# Patient Record
Sex: Female | Born: 1960
Health system: Southern US, Community
[De-identification: ages and names within clinical notes are randomized; demographics above are authoritative.]

## PROBLEM LIST (undated history)

## (undated) ENCOUNTER — Ambulatory Visit (HOSPITAL_COMMUNITY): Admission: EM | Discharge status: Against Medical Advice | Payer: 59

## (undated) DIAGNOSIS — F3289 Other specified depressive episodes: Secondary | ICD-10-CM

## (undated) DIAGNOSIS — R000 Tachycardia, unspecified: Secondary | ICD-10-CM

## (undated) DIAGNOSIS — I10 Essential (primary) hypertension: Secondary | ICD-10-CM

## (undated) DIAGNOSIS — F411 Generalized anxiety disorder: Secondary | ICD-10-CM

## (undated) DIAGNOSIS — E559 Vitamin D deficiency, unspecified: Secondary | ICD-10-CM

## (undated) DIAGNOSIS — F329 Major depressive disorder, single episode, unspecified: Secondary | ICD-10-CM

## (undated) DIAGNOSIS — E669 Obesity, unspecified: Secondary | ICD-10-CM

## (undated) DIAGNOSIS — E282 Polycystic ovarian syndrome: Secondary | ICD-10-CM

## (undated) DIAGNOSIS — S83419A Sprain of medial collateral ligament of unspecified knee, initial encounter: Secondary | ICD-10-CM

## (undated) HISTORY — DX: Essential (primary) hypertension: I10

## (undated) HISTORY — DX: Major depressive disorder, single episode, unspecified: F32.9

## (undated) HISTORY — DX: Other specified depressive episodes: F32.89

## (undated) HISTORY — DX: Tachycardia, unspecified: R00.0

## (undated) HISTORY — DX: Generalized anxiety disorder: F41.1

---
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
## 1998-02-12 ENCOUNTER — Ambulatory Visit (HOSPITAL_COMMUNITY): Admission: RE | Admit: 1998-02-12 | Discharge: 1998-02-12 | Payer: Self-pay | Admitting: Gastroenterology

## 2004-01-01 ENCOUNTER — Other Ambulatory Visit: Admission: RE | Admit: 2004-01-01 | Discharge: 2004-01-01 | Payer: Self-pay | Admitting: Obstetrics and Gynecology

## 2004-06-30 ENCOUNTER — Ambulatory Visit (HOSPITAL_COMMUNITY): Admission: RE | Admit: 2004-06-30 | Discharge: 2004-07-02 | Payer: Self-pay | Admitting: Obstetrics and Gynecology

## 2004-08-11 ENCOUNTER — Ambulatory Visit (HOSPITAL_COMMUNITY): Admission: RE | Admit: 2004-08-11 | Discharge: 2004-08-11 | Payer: Self-pay | Admitting: Obstetrics and Gynecology

## 2004-08-11 ENCOUNTER — Encounter (INDEPENDENT_AMBULATORY_CARE_PROVIDER_SITE_OTHER): Payer: Self-pay | Admitting: Specialist

## 2004-08-11 HISTORY — PX: DILATATION & CURRETTAGE/HYSTEROSCOPY WITH RESECTOCOPE: SHX5572

## 2005-11-08 ENCOUNTER — Encounter: Admission: RE | Admit: 2005-11-08 | Discharge: 2005-11-08 | Payer: Self-pay | Admitting: Obstetrics and Gynecology

## 2006-01-05 ENCOUNTER — Encounter (INDEPENDENT_AMBULATORY_CARE_PROVIDER_SITE_OTHER): Payer: Self-pay | Admitting: Specialist

## 2006-01-05 ENCOUNTER — Ambulatory Visit (HOSPITAL_BASED_OUTPATIENT_CLINIC_OR_DEPARTMENT_OTHER): Admission: RE | Admit: 2006-01-05 | Discharge: 2006-01-05 | Payer: Self-pay | Admitting: Podiatry

## 2006-03-22 HISTORY — PX: HAMMER TOE SURGERY: SHX385

## 2006-09-12 LAB — HM MAMMOGRAPHY

## 2010-05-19 ENCOUNTER — Ambulatory Visit: Payer: Self-pay | Admitting: Internal Medicine

## 2010-05-19 DIAGNOSIS — Z9189 Other specified personal risk factors, not elsewhere classified: Secondary | ICD-10-CM | POA: Insufficient documentation

## 2010-05-19 DIAGNOSIS — F329 Major depressive disorder, single episode, unspecified: Secondary | ICD-10-CM | POA: Insufficient documentation

## 2010-05-19 DIAGNOSIS — F411 Generalized anxiety disorder: Secondary | ICD-10-CM | POA: Insufficient documentation

## 2010-06-16 ENCOUNTER — Ambulatory Visit: Payer: Self-pay | Admitting: Internal Medicine

## 2010-07-21 ENCOUNTER — Ambulatory Visit: Payer: Self-pay | Admitting: Internal Medicine

## 2010-07-21 DIAGNOSIS — I10 Essential (primary) hypertension: Secondary | ICD-10-CM | POA: Insufficient documentation

## 2010-08-16 ENCOUNTER — Ambulatory Visit: Payer: Self-pay | Admitting: Internal Medicine

## 2010-08-16 DIAGNOSIS — H1130 Conjunctival hemorrhage, unspecified eye: Secondary | ICD-10-CM | POA: Insufficient documentation

## 2010-08-27 ENCOUNTER — Ambulatory Visit: Payer: Self-pay | Admitting: Internal Medicine

## 2010-10-03 ENCOUNTER — Encounter: Payer: Self-pay | Admitting: Obstetrics and Gynecology

## 2010-10-12 NOTE — Assessment & Plan Note (Signed)
Summary: NEW / UMR / # / CD   Vital Signs:  Patient profile:   50 year old female Height:      67 inches (170.18 cm) Weight:      213.4 pounds (97 kg) BMI:     33.54 O2 Sat:      97 % on Room air Temp:     98.6 degrees F (37.00 degrees C) oral Pulse rate:   95 / minute BP sitting:   122 / 82  (left arm) Cuff size:   large  Vitals Entered By: Orlan Leavens RMA (May 19, 2010 2:12 PM)  O2 Flow:  Room air CC: New patient Is Patient Diabetic? No Pain Assessment Patient in pain? no        Primary Care Provider:  Newt Lukes MD  CC:  New patient.  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, work, Surveyor, quantity - no social support recognized - doesn't talk to family, spouse or co-workers about her problems- prev used xanax sparingly 7yr ago during divorce - ongoing poor sleep, ruminating thoughs and guilt ("i pushed my dtr too  denies SI/HI  Preventive Screening-Counseling & Management  Alcohol-Tobacco     Alcohol drinks/day: 0     Smoking Status: quit     Tobacco Counseling: not to resume use of tobacco products  Caffeine-Diet-Exercise     Does Patient Exercise: yes     Exercise Counseling: not indicated; exercise is adequate     Depression Counseling: further diagnostic testing and/or other treatment is indicated  Safety-Violence-Falls     Seat Belt Counseling: not indicated; patient wears seat belts     Helmet Counseling: not applicable     Firearm Counseling: not applicable     Violence Counseling: not indicated; no violence risk noted  Clinical Review Panels:  Prevention   Last  Mammogram:  No specific mammographic evidence of malignancy.   (09/12/2006)  Immunizations   Last Tetanus Booster:  Historical (09/12/1997)   Last Flu Vaccine:  Historical (06/12/2009)   Last Pneumovax:  Historical (06/12/2009)   Current Medications (verified): 1)  Vitamin C 1000 Mg Tabs (Ascorbic Acid) .... Take 1 By Mouth Once Daily  Allergies (verified): 1)  ! 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         intentional weight loss 20# due to increase in exercise program 3 months ago -  Physical Exam  General:  overweight-appearing.  alert, well-developed, well-nourished, and cooperative to examination.   emotional Lungs:  normal respiratory effort, no intercostal retractions or use  of accessory muscles; normal breath sounds bilaterally - no crackles and no wheezes.    Heart:  normal rate, regular rhythm, no murmur, and no rub. BLE without edema. Psych:  tearful, anxious and depressed - but oriented X3, memory intact for recent and remote, normally interactive, fair eye contact-   Impression & Recommendations:  Problem # 1:  ANXIETY STATE, UNSPECIFIED (ICD-300.00) panic attack last 24h, brewing symptoms last 3 months - co-exisiting depression - largely situational but ongoing stressors noted with family, dtr- start daily SSRI, xanax as needed and refer to behav health -  discussed all with pt who agrees to same Time spent with patient 32 minutes, more than 50% of this time was spent counseling patient on depression, anxiety and medications available to treat same; also concerning the need for continued counseling close f/u 4 weeks, sooner if needed   Her updated medication list for this problem includes:    Citalopram Hydrobromide 10 Mg Tabs (Citalopram hydrobromide) .Marland Kitchen... 1 by mouth once daily    Alprazolam 0.5 Mg Tabs (Alprazolam) .Marland Kitchen... 1 by mouth every 8 hours as needed for sleep or anxiety or panic attacks  Orders: Psychology Referral (Psychology) Prescription Created Electronically [id number])  Problem # 2:  DEPRESSION (ICD-311) see above re: anxiety Her updated medication list for this problem includes:    Citalopram Hydrobromide 10 Mg Tabs (Citalopram hydrobromide) .Marland Kitchen... 1 by mouth once daily    Alprazolam 0.5 Mg Tabs (Alprazolam) .Marland Kitchen... 1 by mouth every 8 hours as needed for sleep or anxiety or panic attacks  Orders: Psychology Referral (Psychology) Prescription Created Electronically [phone])  Complete Medication List: 1)  Vitamin C 1000 Mg Tabs (Ascorbic acid) .... Take 1 by mouth once daily 2)  Citalopram Hydrobromide 10 Mg Tabs (Citalopram hydrobromide) .Marland Kitchen.. 1 by mouth once daily 3)  Alprazolam 0.5 Mg Tabs (Alprazolam) .Marland Kitchen.. 1 by mouth every 8 hours as needed for sleep or anxiety or panic attacks  Patient Instructions: 1)  it was good to see you today. 2)  we'll make referral to counseling as discussed ([facility]). Our office will contact you regarding this appointment once made.  3)  start citalopram daily  - your prescription has been electronically submitted to your pharmacy. Please take as directed. Contact our office if you believe you're having problems with the medication(s).  4)  also may use xanax (alprazolam) as needed - prescription provided to you to fill 5)  Please schedule a follow-up appointment in 4 weeks, call  sooner if problems.  Prescriptions: ALPRAZOLAM 0.5 MG TABS (ALPRAZOLAM) 1 by mouth every 8 hours as needed for sleep or anxiety or panic attacks  #30 x 1   Entered and Authorized by:   Newt Lukes MD   Signed by:   Newt Lukes MD on 05/19/2010   Method used:   Print then Give to Patient   RxID:   [id number] CITALOPRAM HYDROBROMIDE 10 MG TABS (CITALOPRAM HYDROBROMIDE) 1 by mouth once daily  #30 x 1   Entered and Authorized by:   Newt Lukes MD   Signed by:   Newt Lukes MD on 05/19/2010   Method used:   Electronically to        [facility]* (retail)       [address]       [address] Shipping/mailing       [place], [place]  [address]       Ph: [phone]  Fax: [phone]   RxID:   [id number]    Immunization History:  Tetanus/Td Immunization History:    Tetanus/Td:  historical (09/12/1997)  Influenza Immunization History:    Influenza:  historical (06/12/2009)  Pneumovax Immunization History:    Pneumovax:  historical (06/12/2009)    Mammogram  Procedure date:  09/12/2006  Findings:      No specific mammographic evidence of malignancy.

## 2010-10-12 NOTE — Assessment & Plan Note (Signed)
Summary: 1 wk f/u #/cd   Vital Signs:  Patient profile:   50 year old female Height:      67 inches (170.18 cm) Weight:      215.8 pounds (98.09 kg) O2 Sat:      99 % on Room air Temp:     98.9 degrees F (37.17 degrees C) oral Pulse rate:   86 / minute BP sitting:   122 / 86  (left arm) Cuff size:   large  Vitals Entered By: Orlan Leavens RMA (July 21, 2010 11:41 AM)  O2 Flow:  Room air CC: 4 week follow-up Is Patient Diabetic? No Pain Assessment Patient in pain? no        Primary Care Provider:  Newt Lukes MD  CC:  4 week follow-up.  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 denies adverse side effects related to current therapy.  still ongoing poor sleep but improved fewer ruminating thoughs and guilt ("i pushed my dtr too hard") denies SI/HI - ("i have my grip again")  ?high DBP at screening - also occ "fluid weight" per weight watchers  Current Medications (verified): 1)  Vitamin C 1000 Mg Tabs (Ascorbic Acid) .... Take 1 By Mouth Once Daily 2)  Citalopram Hydrobromide 20 Mg Tabs (Citalopram Hydrobromide) .Marland Kitchen.. 1 By Mouth Once Daily 3)  Alprazolam 1 Mg Tabs (Alprazolam) .Marland Kitchen.. 1 By Mouth Every 8 Hours As Needed For Anxiety or Panic Symptoms  Allergies (verified): 1)  ! Tylox  Past History:  Past  Medical History: anxiety, situational  Review of Systems  The patient denies chest pain, syncope, headaches, and abdominal pain.    Physical Exam  General:  overweight-appearing.  alert, well-developed, well-nourished, and cooperative to examination.  Lungs:  normal respiratory effort, no intercostal retractions or use of accessory muscles; normal breath sounds bilaterally - no crackles and no wheezes.    Heart:  normal rate, regular rhythm, no murmur, and no rub. BLE without edema. Psych:  Oriented X3, memory intact for recent and remote, normally interactive, good eye contact, not anxious appearing, not depressed appearing, and not agitated.      Impression & Recommendations:  Problem # 1:  ANXIETY STATE, UNSPECIFIED (ICD-300.00)  Her updated medication list for this problem includes:    Citalopram Hydrobromide 20 Mg Tabs (Citalopram hydrobromide) .Marland Kitchen... 1 by mouth once daily    Alprazolam 1 Mg Tabs (Alprazolam) .Marland Kitchen... 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 Referral (Misc. Ref) Prescription  Created Electronically [phone])  Problem # 2:  ELEVATED BLOOD PRESSURE WITHOUT DIAGNOSIS OF HYPERTENSION (ICD-796.2)  high diastolic at employment screening - normal 05/2010 and 06/2010 Ov here - as needed diuretic and monitor -  Her updated medication list for this problem includes:    Hydrochlorothiazide 12.5 Mg Tabs (Hydrochlorothiazide) .Marland Kitchen... 1 by mouth once daily  BP today: 122/86 Prior BP: 120/82 (06/16/2010)  Instructed in low sodium diet (DASH Handout) and behavior modification.     Orders: Prescription Created Electronically [phone])  Complete Medication List: 1)  Vitamin C 1000 Mg Tabs (Ascorbic acid) .... Take 1 by mouth once daily 2)  Citalopram Hydrobromide 20 Mg Tabs (Citalopram hydrobromide) .Marland Kitchen.. 1 by mouth once daily 3)  Alprazolam 1 Mg Tabs (Alprazolam) .Marland Kitchen.. 1 by mouth every 8 hours as needed for anxiety or panic symptoms 4)  Hydrochlorothiazide 12.5 Mg Tabs (Hydrochlorothiazide) .Marland Kitchen.. 1 by mouth once daily  Patient Instructions: 1)  it was good to see you today. 2)  keep working on appointment for counseling as discussed  3)  continue same dose citalopram daily  - your 90 day prescription has been electronically submitted to your pharmacy. 4)  also may continue to use xanax (alprazolam) as/if needed -  5)  start low dose diuretic as needed  6)  will watch BP - call if >140/85 7)  Please schedule a follow-up appointment in 6 weeks to continue review, call sooner if problems.  Prescriptions: CITALOPRAM HYDROBROMIDE 20 MG TABS (CITALOPRAM HYDROBROMIDE) 1 by mouth once daily  #90 x 3   Entered and Authorized by:   Newt Lukes MD   Signed by:   Newt Lukes MD on 07/21/2010   Method used:   Electronically to        [facility]* (retail)       [address]       [address] Shipping/mailing       [place], [place]  [address]       Ph: [phone]       Fax: [phone]   RxID:   [id number] HYDROCHLOROTHIAZIDE 12.5 MG TABS (HYDROCHLOROTHIAZIDE) 1 by mouth once daily  #30 x 3   Entered and Authorized by:   Newt Lukes MD   Signed by:   Newt Lukes MD on 07/21/2010   Method used:   Electronically to        [facility]* (retail)       [address]       [address] Shipping/mailing       [place], [place]  [address]       Ph: [phone]       Fax: [phone]   RxID:   [id number]    Orders Added: 1)  Est. Patient Level IV [16010] 2)  Prescription Created Electronically  [phone]

## 2010-10-12 NOTE — Assessment & Plan Note (Signed)
Summary: 4 wk f/u #/cd   Vital Signs:  Patient profile:   50 year old female Height:      67 inches (170.18 cm) Weight:      213.8 pounds (97.18 kg) O2 Sat:      97 % on Room air Temp:     99.3 degrees F (37.39 degrees C) oral Pulse rate:   96 / minute BP sitting:   120 / 82  (left arm) Cuff size:   large  Vitals Entered By: Orlan Leavens RMA (June 16, 2010 11:07 AM)  O2 Flow:  Room air CC: 4 week follow-up Is Patient Diabetic? No Pain Assessment Patient in pain? no        Primary Care Krisinda Giovanni:  Newt Lukes MD  CC:  4 week follow-up.  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 denies adverse side effects related to current therapy.  still ongoing poor sleep but improved fewer ruminating thoughs and guilt ("i pushed my dtr too hard") denies SI/HI - ("i have my grip again")  Current Medications (verified): 1)  Vitamin C 1000 Mg Tabs (Ascorbic Acid) .... Take 1 By Mouth Once Daily 2)  Citalopram Hydrobromide 10 Mg Tabs (Citalopram Hydrobromide) .Marland Kitchen.. 1 By Mouth Once Daily 3)  Alprazolam 0.5 Mg Tabs (Alprazolam) .Marland Kitchen.. 1 By Mouth Every 8 Hours As Needed For Sleep or Anxiety or Panic Attacks  Allergies (verified): 1)  ! Tylox  Past History:  Past Medical History: anxiety, situational  Review of Systems  The patient denies fever, weight loss, chest pain,  syncope, and dyspnea on exertion.    Physical Exam  General:  overweight-appearing.  alert, well-developed, well-nourished, and cooperative to examination.  Lungs:  normal respiratory effort, no intercostal retractions or use of accessory muscles; normal breath sounds bilaterally - no crackles and no wheezes.    Heart:  normal rate, regular rhythm, no murmur, and no rub. BLE without edema. Psych:  not tearful during exam, less anxious and depressed than prior OV but stilll tears in eyes - oriented X3, memory intact for recent and remote, normally interactive, good eye contact-   Impression & Recommendations:  Problem # 1:  ANXIETY STATE, UNSPECIFIED (ICD-300.00)  Her updated medication list for this problem includes:    Citalopram Hydrobromide 20 Mg Tabs (Citalopram hydrobromide) .Marland Kitchen... 1 by mouth once daily    Alprazolam 1 Mg Tabs (Alprazolam) .Marland Kitchen... 1 by mouth every 8 hours as needed for anxiety or panic symptoms  panic attack 05/18/10 and brewing symptoms since 02/2010 co-exisiting depression - largely situational but ongoing stressors noted with family, dtr- begqan citalopram 05/2010 and xanax as needed  - feels much improved but not yet at goal - oncrease dose of each - new rx done will re-refer to [facility] health, non [facility] affiliated due to prior employment at [employer] [facility]: "too close"  close f/u 4 weeks, sooner if needed    Orders: Misc. Referral (Misc.  Ref) Prescription Created Electronically [id number])  Problem # 2:  DEPRESSION (ICD-311)  Her updated medication list for this problem includes:    Citalopram Hydrobromide 20 Mg Tabs (Citalopram hydrobromide) .Marland Kitchen... 1 by mouth once daily    Alprazolam 1 Mg Tabs (Alprazolam) .Marland Kitchen... 1 by mouth every 8 hours as needed for anxiety or panic symptoms  Orders: Misc. Referral (Misc. Ref) Prescription Created Electronically [phone] [phone])  see above re: anxiety  Complete Medication List: 1)  Vitamin C 1000 Mg Tabs (Ascorbic acid) .... Take 1 by  mouth once daily 2)  Citalopram Hydrobromide 20 Mg Tabs (Citalopram hydrobromide) .Marland Kitchen.. 1 by mouth once daily 3)  Alprazolam 1 Mg Tabs (Alprazolam) .Marland Kitchen.. 1 by mouth every 8 hours as needed for anxiety or panic symptoms  Patient Instructions: 1)  it was good to see you today. 2)  we'll make new referral to counseling as discussed (not at behavioral health). Our office will contact you regarding this appointment once made.  3)  incraese citalopram daily  - your prescription has been electronically submitted to your pharmacy. Please take as directed. Contact our office if you believe you're having problems with the medication(s).  4)  also may use xanax (alprazolam) as needed - prescription provided to you to fill 5)  Please schedule a follow-up appointment in 4-6 weeks to continue review, call sooner if problems.  Prescriptions: ALPRAZOLAM 1 MG TABS (ALPRAZOLAM) 1 by mouth every 8 hours as needed for anxiety or panic symptoms  #30 x 1   Entered and Authorized by:   Newt Lukes MD   Signed by:   Newt Lukes MD on 06/16/2010   Method used:   Print then Give to Patient   RxID:   [id number] CITALOPRAM HYDROBROMIDE 20 MG TABS (CITALOPRAM HYDROBROMIDE) 1 by mouth once daily  #30 x 3   Entered and Authorized by:   Newt Lukes MD   Signed by:   Newt Lukes MD on 06/16/2010   Method used:   Electronically to        [facility]* (retail)       [address]       [address] Shipping/mailing       [place], [place]  [address]       Ph: [phone]       Fax: [phone]   RxID:   [id number]
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
## 2010-11-25 ENCOUNTER — Ambulatory Visit (INDEPENDENT_AMBULATORY_CARE_PROVIDER_SITE_OTHER): Payer: 59 | Admitting: Internal Medicine

## 2010-11-25 ENCOUNTER — Encounter: Payer: Self-pay | Admitting: Internal Medicine

## 2010-11-25 DIAGNOSIS — J309 Allergic rhinitis, unspecified: Secondary | ICD-10-CM | POA: Insufficient documentation

## 2010-11-25 DIAGNOSIS — F411 Generalized anxiety disorder: Secondary | ICD-10-CM

## 2010-11-25 DIAGNOSIS — R05 Cough: Secondary | ICD-10-CM | POA: Insufficient documentation

## 2010-11-25 DIAGNOSIS — R059 Cough, unspecified: Secondary | ICD-10-CM
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
## 2010-12-31 ENCOUNTER — Other Ambulatory Visit: Payer: Self-pay | Admitting: Internal Medicine

## 2011-01-03 ENCOUNTER — Telehealth: Payer: Self-pay | Admitting: Internal Medicine

## 2011-01-03 ENCOUNTER — Ambulatory Visit (INDEPENDENT_AMBULATORY_CARE_PROVIDER_SITE_OTHER): Payer: 59 | Admitting: Internal Medicine

## 2011-01-03 ENCOUNTER — Encounter: Payer: Self-pay | Admitting: Internal Medicine

## 2011-01-03 ENCOUNTER — Ambulatory Visit (INDEPENDENT_AMBULATORY_CARE_PROVIDER_SITE_OTHER)
Admission: RE | Admit: 2011-01-03 | Discharge: 2011-01-03 | Disposition: A | Discharge status: Home / Self Care | Payer: 59 | Source: Ambulatory Visit | Attending: Internal Medicine | Admitting: Internal Medicine

## 2011-01-03 VITALS — BP 120/84 | HR 84 | Temp 98.8°F | Ht 67.0 in

## 2011-01-03 DIAGNOSIS — R059 Cough, unspecified: Secondary | ICD-10-CM

## 2011-01-03 DIAGNOSIS — R05 Cough: Secondary | ICD-10-CM

## 2011-01-03 MED ORDER — AZITHROMYCIN 250 MG PO TABS: 250.0000 mg | ORAL_TABLET | Freq: Every day | ORAL | Status: AC

## 2011-01-03 MED ORDER — ESOMEPRAZOLE MAGNESIUM 40 MG PO CPDR: 40.0000 mg | DELAYED_RELEASE_CAPSULE | Freq: Every day | ORAL | Status: DC

## 2011-01-03 MED ORDER — CHLORPHENIRAMINE-HYDROCODONE 8-10 MG/5ML PO LQCR: 5.0000 mL | Freq: Two times a day (BID) | ORAL | Status: DC | PRN
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
## 2011-01-03 NOTE — Assessment & Plan Note (Signed)
persisting >6 weeks despite tx allergic rhinitis symptoms No red flags on hx - no fever, weight loss, sputum or NS, nonsmoker hx Check cxr; tx Zpack, PPI and narcotic antitussive qhs - rx done

## 2011-01-03 NOTE — Telephone Encounter (Signed)
Called pt no ansew  LMOM cxr normal

## 2011-01-03 NOTE — Telephone Encounter (Signed)
Please call patient - normal cxr results. No medication changes recommended. Continue as per OV 01/03/11- Thanks.

## 2011-01-03 NOTE — Patient Instructions (Signed)
It was good to see you today. Test(s) ordered today. Your results will be called to you after review (48-72hours after test completion). If any changes need to be made, you will be notified at that time. Zapk, Nexium and night time cough syrup as discussed for cough control - Your prescription(s) have been submitted to your pharmacy. Please take as directed and contact our office if you believe you are having problem(s) with the medication(s). .Please keep scheduled followup as planned, call sooner if problems.

## 2011-01-04 ENCOUNTER — Encounter: Payer: Self-pay | Admitting: Internal Medicine

## 2011-01-04 ENCOUNTER — Telehealth: Payer: Self-pay

## 2011-01-04 MED ORDER — FLUTICASONE PROPIONATE 50 MCG/ACT NA SUSP: 2.0000 | NASAL | Status: DC

## 2011-01-04 NOTE — Telephone Encounter (Signed)
Pt called stating Rx for cough syrup caused itching and mild swelling of her lips, pt is requesting alternative.

## 2011-01-04 NOTE — Telephone Encounter (Signed)
Pt advised.

## 2011-01-04 NOTE — Telephone Encounter (Signed)
Stop tussionex - continue tessalon perles (ok to send refill if needed) but no other rx rec for cough due to narcotic allergies - use OTC mucinex dm or OTC night time cough syrup for symptoms as needed - thanks

## 2011-01-28 NOTE — H&P (Signed)
[name], [name] Reed             ACCOUNT NO.:  [id number]   MEDICAL RECORD NO.:  [id number]          PATIENT TYPE:  AMB   LOCATION:  [facility]                           FACILITY:  [facility]   PHYSICIAN:  Richardean Sale, M.D.   DATE OF BIRTH:  1961-04-14   DATE OF ADMISSION:  DATE OF DISCHARGE:                                HISTORY & PHYSICAL   DATE OF PLANNED PROCEDURE:  August 11, 2004   PREOPERATIVE DIAGNOSES:  1.  Menometrorrhagia.  2.  Endometrial polyp.   HISTORY OF PRESENT ILLNESS:  This is a 50 year old gravida 4 para 3-0-1-3  African-American female with significant menometrorrhagia.  The patient  states her cycles have become progressively more frequent and are occurring  every 14-21 days, lasting up to 7-8 days in duration.  She has significant  flooding of her clothes and significant cramping.  The patient has been  evaluated with ultrasound which revealed a thickened endometrial stripe  within an irregular appearance.  An endometrial biopsy obtained in the  office revealed an endometrial polyp with no evidence of hyperplasia or  malignancy.  The patient presents for management of endometrial polyp with  hysteroscopy D&C.   PAST MEDICAL HISTORY:  No prior hospitalizations, no history of  cardiopulmonary disease.   SURGICAL HISTORY:  1.  Tubal ligation.  2.  Cryotherapy of the cervix.   GYNECOLOGICAL HISTORY:  1.  Abnormal Pap smear status post cryosurgery.  2.  History of Trichomonas.  No other STDs.   OBSTETRICAL HISTORY:  Vaginal delivery x3, miscarriage x1.   SOCIAL HISTORY:  Former tobacco use, quit January 2005.  Drinks occasional  alcohol.  Denies any drug use.  Monogamous relationship.   FAMILY HISTORY:  No significant bleeding disorders or blood clotting  disorders.  Significant for hypertension in her mother.  Father died due to  an accidental drowning.  There is no known history of breast, ovarian,  uterine, or colon cancer.   REVIEW OF SYSTEMS:  Denies  chest pain, shortness of breath, lower extremity  edema, palpitations, nausea, vomiting, or diarrhea.   REVIEW OF SYSTEMS:  Positive for heavy vaginal bleeding and menstrual  cramping.   PHYSICAL EXAMINATION:  VITAL SIGNS:  She is afebrile, vital signs are  stable.  Weight 213 pounds, height 5 feet 5 inches.  GENERAL:  She is a well-developed, well-nourished black female who is in no  apparent distress  NECK AND THYROID:  Within normal limits.  HEART:  Regular rate and rhythm.  LUNGS:  Clear to auscultation bilaterally.  ABDOMEN:  Soft, nontender, nondistended.  No palpable masses.  Liver and  spleen are normal.  There is no hernia.  EXTREMITIES:  No cyanosis, clubbing, or edema, nontender.  PELVIC:  External genitalia are within normal limits.  Bulbourethral and  Skene's glands are within normal limits.  Bladder and urethra are normal to  palpation.  Uterus is anteverted, mobile, and nontender.  Adnexa not  palpable and nontender.   DATA:  Endometrial biopsy in August 2005 revealed endometrial polyp  functional type, weakly secretory endometrium, no hyperplasia or malignancy.  Laboratory studies:  Hemoglobin  13.6.  TSH normal.  Pap smear:  Benign  reactive changes, negative for intraepithelial lesion or malignancy.   ASSESSMENT:  A 50 year old gravida 4 para 3-0-1-3 black female with  menometrorrhagia, endometrial polyp on endometrial biopsy, who presents for  hysteroscopy D&C.   PLAN:  Will proceed with hysteroscopy D&C.  Reviewed the risks, benefits,  and alternatives of the procedure in detail.  Discussed the risk of  hemorrhage requiring transfusion, uterine perforation requiring exploratory  surgery through the abdomen to evaluate for any bowel or bladder damage,  infection which could prolong hospitalization, anesthetic-related  complications, and the possibility of DVT or pulmonary embolus.  The patient  voiced understanding of all these risks and agrees to proceed.   Informed  consent has been obtained.      JW/MEDQ  D:  08/10/2004  T:  08/10/2004  Job:  [id number]

## 2011-01-28 NOTE — Op Note (Signed)
[name], [name]             ACCOUNT NO.:  [id number]   MEDICAL RECORD NO.:  [id number]          PATIENT TYPE:  AMB   LOCATION:  SDC                           FACILITY:  [facility]   PHYSICIAN:  Richardean Sale, M.D.   DATE OF BIRTH:  01-Sep-1961   DATE OF PROCEDURE:  08/11/2004  DATE OF DISCHARGE:                                 OPERATIVE REPORT   PREOPERATIVE DIAGNOSES:  1.  Endometrial polyps.  2.  Menometrorrhagia.   POSTOPERATIVE DIAGNOSES:  1.  Endometrial polyps.  2.  Menometrorrhagia.   PROCEDURE:  Hysteroscopy, D&C, with removal of endometrial polyps.   SURGEON:  Richardean Sale, M.D.   ANESTHESIA:  General endotracheal and paracervical block.   COMPLICATIONS:  None.   ESTIMATED BLOOD LOSS:  Minimal.   FLUID DEFICIT:  30 cc.   SPECIMENS:  Multiple endometrial polyps and curettings sent to pathology.   FINDINGS:  Multiple endometrial polyps.  Otherwise, normal appearance of  uterine cavity.   INDICATIONS FOR PROCEDURE:  This is a 50 year old African American female  gravida 4, para 3-0-1-3 who has had significant menometrorrhagia over the  past few months, with cycles occurring every 14 to 21 days and last 7 to 8  days in duration.  Endometrial biopsy revealed an endometrial polyp, and  ultrasound showed a thickened endometrial stripe.  The patient presents  today for diagnosis and management with hysteroscopy, D&C.  Prior to the  procedure, the risks, benefits, and alternatives of the procedure were given  to the patient, and informed consent was obtained.  We discussed the risks  of hemorrhage requiring transfusion, infection, injury to the uterus which  could result in injury to the bowel or bladder which may require additional  exploratory surgery, and anesthetic-related complications.  The patient  voiced understanding and agreed to proceed.  Informed consent was obtained  before proceeding to the OR.   DESCRIPTION OF PROCEDURE:  The patient was taken to the  operating room.  She  was then prepped and draped in the usual sterile fashion using Betadine and  placed in the dorsal lithotomy position.  A red rubber catheter was used to  drain the bladder.  Bimanual exam confirmed the presence of a midline mobile  uterus with no obvious masses.  The adnexa was palpated and was normal.   A speculum was placed into the vagina.  The cervix was visualized, and a  paracervical block was administered using a total of 20 cc of 1% Nesacaine.  A single-tooth tenaculum was then used to grasp the anterior lip of the  cervix, and the cervix was then very gently dilated with the Hegar dilators.  The bivalve speculum was removed, and a posterior weighted speculum was then  inserted into the vagina.  The hysteroscope was then introduced, and  inspection of the uterine cavity revealed the findings as noted above.  The  hysteroscope was removed.  The polyp forceps were then used to remove  multiple polyps.  This was followed by sharp curettage.  After curettage was  complete, the hysteroscope was reintroduced.  The uterine cavity was now  clear  of any polyps and appeared normal.  The hysteroscope was then removed.  The single-tooth tenaculum was removed.  Pressure was applied to the cervix  to facilitate hemostasis at the tenaculum site.  After this occurred, the  speculum was then removed from the vagina and instruments were removed.  Sponge, lap, needle, and instrument counts were correct x2.   The patient was then awakened from general anesthesia, was taken out of the  dorsal lithotomy position, and taken to the recovery room awake and in  stable condition.  There were no complications.      JW/MEDQ  D:  08/11/2004  T:  08/11/2004  Job:  [id number]

## 2011-01-28 NOTE — H&P (Signed)
[name], [name]             ACCOUNT NO.:  [id number]   MEDICAL RECORD NO.:  [id number]           PATIENT TYPE:   LOCATION:                                 FACILITY:   PHYSICIAN:  Richardean Sale, M.D.   DATE OF BIRTH:  [birth year]   DATE OF ADMISSION:  DATE OF DISCHARGE:                                HISTORY & PHYSICAL   PREOPERATIVE DIAGNOSES:  1.  Menometrorrhagia.  2.  Endometrial polyp.   HISTORY OF PRESENT ILLNESS:  This is a 50 year old gravida 4 para 3-0-1-3  African-American female with significant menometrorrhagia.  The patient said  her cycles have become progressively more frequent.  They are occurring  every 14-21 days and will last 7-8 days.  She has significant flooding of  her clothes as well as cramping and occasional hot flashes.  The patient  underwent evaluation with ultrasound, revealed a thickened endometrial  stripe with an irregular appearance.  The patient underwent endometrial  biopsy which revealed the presence of an endometrial polyp with no evidence  of hyperplasia or malignancy.  The patient presents for management of  endometrial polyps with hysteroscopy, D&C.   PAST MEDICAL HISTORY:  No history of cardiopulmonary disease.  No prior  hospitalizations.   SURGICAL HISTORY:  1.  Tubal ligation.  2.  Cryotherapy to the cervix.   GYNECOLOGIC HISTORY:  1.  Positive history of abnormal Pap smear, status post cryosurgery.  2.  Positive history of Trichomonas.  No other STDs.   OBSTETRIC HISTORY:  Vaginal delivery x 3.  Miscarriage x 1.   SOCIAL HISTORY:  Former tobacco use.  Quit in January 2005.  Drinks alcohol  occasionally.  Denies any drug use.  Monogamous.   FAMILY HISTORY:  No significant bleeding disorders or blood clotting  disorders.  Significant for hypertension in her mother.  Father died due to  an accidental drowning.  No known history of breast, ovarian, uterine, or  colon cancer.   REVIEW OF SYSTEMS:  Denies chest pain,  shortness of breath, lower extremity  edema, palpitations, nausea, vomiting, diarrhea.  Review of systems positive  for heavy vaginal bleeding.   PHYSICAL EXAMINATION:  BLOOD PRESSURE:  128/76.  WEIGHT:  213 pounds.  HEIGHT:  5 feet 5 inches.  TEMPERATURE:  Afebrile.  GENERAL:  Well-developed, well-nourished black female who is in no apparent  distress.  NECK:  Neck and thyroid are within normal limits.  HEART:  Regular rate and rhythm.  LUNGS:  Clear to auscultation bilaterally.  ABDOMEN:  Soft, nontender, nondistended.  No palpable masses.  Liver and  spleen are normal.  There is no hernia.  EXTREMITIES:  No cyanosis, clubbing, or edema.  Nontender.  PELVIC:  Uterus is anteverted, mobile, nontender.  Adnexae not palpable,  nontender.  External genitalia:  Bulbar, urethra, and Skene's glands within  normal limits.  Bladder and urethra normal.   DATA:  Endometrial biopsy performed in August 2005:  Endometrial polyp,  functional type, weakly secretory endometrium.  No hyperplasia or malignancy  identified.   LABORATORY STUDIES:  Hemoglobin 13.6.  TSH within normal limits.  Pap smear:  Benign reactive changes, negative for intraepithelial lesion or malignancy.   ASSESSMENT:  A 50 year old gravida 4 para 3-0-1-3 black female with  menometrorrhagia, endometrial polyp, and endometrial biopsy, who presents  for hysteroscopy, D&C.   PLAN:  Will proceed with hysteroscopy, D&C.  We reviewed the risks,  benefits, and alternatives of the procedure in detail.  We discussed the  risks of hemorrhage requiring transfusion, uterine perforation requiring  exploratory surgery through the abdomen to evaluate for any bowel or bladder  damage, infection which could prolong hospitalization, anesthetic-related  complications, and the possibility of DVT.  The patient voices understanding  of all these risks and agrees to proceed.  Informed consent has been  obtained.      JW/MEDQ  D:  07/01/2004   T:  07/01/2004  Job:  [id number]

## 2011-01-28 NOTE — Op Note (Signed)
[name], [name]             ACCOUNT NO.:  [id number]   MEDICAL RECORD NO.:  [id number]          PATIENT TYPE:  AMB   LOCATION:  DSC                          FACILITY:  [facility]   PHYSICIAN:  Ysidro Evert. Regal, D.P.M. DATE OF BIRTH:  [birth year]   DATE OF PROCEDURE:  03/22/2006  DATE OF DISCHARGE:  01/05/2006                                 OPERATIVE REPORT   SURGEON:  Ysidro Evert. Regal, D.P.M.   PREOPERATIVE DIAGNOSES:  1.  Plantar flexed second metatarsal left foot.  2.  Hammertoe deformity fifth digit left foot.  3.  Plantar lesion second metatarsal left foot with callus formation.  4.  Hammertoe deformity fifth digit right foot.   POSTOPERATIVE DIAGNOSES:  1.  Plantar flexed second metatarsal left foot.  2.  Hammertoe deformity fifth digit left foot.  3.  Plantar lesion second metatarsal left foot with callus formation.  4.  Hammertoe deformity fifth digit right foot.   PROCEDURES:  1.  Elevating osteotomy with 2.0 self-tapping cortical screw fixation on the      left.  2.  Arthroplasty, digit 5 left.  3.  Excision of 4 cm skin lesion plantar aspect second metatarsal left.  4.  Hammertoe correction fifth digit, right.   INDICATIONS:  Chronic discomfort, inability to wear shoe gear without  difficulty.  Has tried wider shoes, trims and pads without relief of  symptoms.   DICTATING THE OP REPORT:  Ysidro Evert. Regal, D.P.M., surgeon.   Patient is brought to the OR and placed in the supine position on the OR  table.  The patient is injected with a total of 20 mL Xylocaine, Marcaine  mixture for anesthesia.  The patient's feet were prepped and draped using  standard aseptic technique.  The left foot was examined, utilizing Esmarch  and the left ankle tourniquet was inflated utilizing tourniquet at 250 mm of  pressure.  The following procedures were performed.   PROCEDURE #1:  Elevating osteotomy second left.  Attention was directed to  the dorsal aspect left foot where an  approximate 5 cm linear incision was  made centered over the second metatarsal.  The incision was deepened down to  the level of the capsule with hemostasis being acquired as necessary.  A  capsular incision was performed in the head and shaft of the second  metatarsal and a wedge-shaped osteotomy was performed in the second  metatarsal with a large dorsal wedge being removed, so as to elevate the  second metatarsal head.  This was found to be adequate.  It was fixated  utilizing 2.0 self-tapping cortical screw 12 mm fixation.  The capsular  tissue was reapproximated and maintained utilizing #3-0 Dexon in continuous  running fashion.  Subcutaneous tissue reapproximated __________ utilizing #4-  0 Dexon subcutaneous fashion and the skin margins reapproximated __________  utilizing #5-0 Dexon in a subcuticular fashion.   PROCEDURE #2:  Arthroplasty, digit 5 left.  Attention was directed to the  dorsal aspect digit 5 left, where a semi-elliptical incision was made  centered over the head of the proximal phalanx.  The incision was deepened  down through subcutaneous tissue to capsule and intravenous skin wedge was  removed in-toto.  A transverse incision into the capsule was made at the  level of the head at the proximal interphalangeal joint and the head of the  proximal phalanx was delivered from the wound and resected in toto.  The  wound was flushed with copious amounts of Garamycin solution and the wound  edges were approximated utilizing #5-0 nylon.   PROCEDURE #3:  Excision of a plantar skin wedge, left.  Attention was  directed to plantar aspect left where there is a large keratotic lesion.  Utilizing sharp dissection, a 4 cm was made in a linear fashion encompassing  the entire callus formation.  The incision was deepened to and through  subcutaneous tissue and the intervening skin wedge removed in toto.  The  wound edge was reapproximated, maintained utilizing #4-0 nylon.   Surgical  sites on the left foot were infiltrated with 1 mL of dexamethasone and a  dry, sterile compressive dressing applied to the left foot.  The left ankle  tourniquet was deflated, capillary fill was noted to be immediate to all  digits of the left foot.  The patient tolerated both surgery and anesthesia  well, was transferred out of the OR.  Attention was then directed to the  right foot where the foot was exsanguinated utilizing Esmarch.  The right  ankle tourniquet was inflated to 250 mmHg and the following procedure was  performed:  Arthroplasty, digit 5 right.  For all intents and purposes,  procedure was performed in identical fashion to procedure #2 for the left  foot.  The right foot was infiltrated with 1mL of dexamethasone and a dry  sterile compressive dressing applied.  The tourniquet was released.  The  patient was taken postop in satisfactory condition with a prescription for  [employer] and all postoperative instructions.           ______________________________  Ysidro Evert Regal, D.P.M.     NSR/MEDQ  D:  03/22/2006  T:  03/22/2006  Job:  [id number]

## 2011-04-01 ENCOUNTER — Encounter: Payer: Self-pay | Admitting: Internal Medicine

## 2011-04-01 ENCOUNTER — Ambulatory Visit (INDEPENDENT_AMBULATORY_CARE_PROVIDER_SITE_OTHER): Payer: 59 | Admitting: Internal Medicine

## 2011-04-01 VITALS — BP 130/92 | HR 92 | Temp 98.1°F | Ht 67.0 in | Wt 219.8 lb

## 2011-04-01 DIAGNOSIS — I10 Essential (primary) hypertension: Secondary | ICD-10-CM

## 2011-04-01 DIAGNOSIS — F411 Generalized anxiety disorder: Secondary | ICD-10-CM

## 2011-04-01 DIAGNOSIS — J309 Allergic rhinitis, unspecified: Secondary | ICD-10-CM

## 2011-04-01 MED ORDER — HYDROCHLOROTHIAZIDE 25 MG PO TABS: 25.0000 mg | ORAL_TABLET | Freq: Every day | ORAL | Status: DC

## 2011-04-01 NOTE — Assessment & Plan Note (Signed)
Started citalopram and xanax for same symptoms 05/2010 - stable and doing well The current medical regimen is effective;  continue present plan and medications.

## 2011-04-01 NOTE — Assessment & Plan Note (Signed)
BP Readings from Last 3 Encounters:  04/01/11 130/92  01/03/11 120/84  11/25/10 112/78   Increase hctz dose now - erx done Check lytes/Cr in 2 weeks Pt to monitor and call if BP> 130/90 to consider adding ARB
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
## 2011-04-01 NOTE — Patient Instructions (Signed)
It was good to see you today. Increase dose of HCTZ today for high blood pressure - Your prescription(s) have been submitted to your pharmacy. Please take as directed and contact our office if you believe you are having problem(s) with the medication(s). Monitor BP weekly and call if >130/90 Return in 2 weeks for lab only to monitor potassium and kidney function -  Your results will be called to you after review (48-72hours after test completion). If any changes need to be made, you will be notified at that time. Please schedule followup in 3-4 months to monitor BP and meds, call sooner if problems.

## 2011-04-01 NOTE — Assessment & Plan Note (Signed)
Cough symptoms resolved with nasal steroid to control same The current medical regimen is effective;  continue present plan and medications.

## 2011-05-18 ENCOUNTER — Encounter: Payer: Self-pay | Admitting: Internal Medicine

## 2011-05-18 ENCOUNTER — Ambulatory Visit (INDEPENDENT_AMBULATORY_CARE_PROVIDER_SITE_OTHER): Payer: 59 | Admitting: Internal Medicine

## 2011-05-18 ENCOUNTER — Encounter: Payer: Self-pay | Admitting: *Deleted

## 2011-05-18 DIAGNOSIS — M549 Dorsalgia, unspecified: Secondary | ICD-10-CM

## 2011-05-18 DIAGNOSIS — S239XXA Sprain of unspecified parts of thorax, initial encounter: Secondary | ICD-10-CM

## 2011-05-18 MED ORDER — DICLOFENAC SODIUM 75 MG PO TBEC: 75.0000 mg | DELAYED_RELEASE_TABLET | Freq: Two times a day (BID) | ORAL | Status: DC

## 2011-05-18 MED ORDER — KETOROLAC TROMETHAMINE 60 MG/2ML IM SOLN
60.0000 mg | Freq: Once | INTRAMUSCULAR | Status: AC
  Administered 2011-05-18: 60 mg via INTRAMUSCULAR

## 2011-05-18 MED ORDER — CYCLOBENZAPRINE HCL 10 MG PO TABS: 10.0000 mg | ORAL_TABLET | Freq: Three times a day (TID) | ORAL | Status: AC | PRN

## 2011-05-18 NOTE — Progress Notes (Signed)
  Subjective:    [name] is a 50 y.o. female who presents for evaluation of low back pain. The patient has had no prior back problems. Symptoms have been present for 3 days and are gradually worsening.  Onset was related to / precipitated by a twisting movement. The pain is located in the interscapular area or below right scapula and does not radiate. The pain is described as sharp and stiffness and occurs all day. She rates her pain as moderate. Symptoms are exacerbated by extension, lifting, lying down and twisting. Symptoms are improved by ice. She has also tried rest which provided no symptom relief. She has no other symptoms associated with the back pain. The patient has no "red flag" history indicative of complicated back pain.  The following portions of the patient's history were reviewed and updated as appropriate: allergies, current medications, past family history, past medical history, past social history, past surgical history and problem list.  Review of Systems Constitutional: negative for fevers, sweats and weight loss Genitourinary:negative    Objective:   Normal reflexes, gait, strength and negative straight-leg raise. Inspection and palpation: inspection of back is normal, paraspinal tenderness noted Right supscapular region with myofascial spasm. Muscle tone and ROM exam: muscle spasm noted , full range of motion with pain, antalgic gait.    Assessment:    acute back pain from strain with myofascial spasm    Plan:    Natural history and expected course discussed. Questions answered. Proper lifting, bending technique discussed. Ice to affected area as needed for local pain relief. NSAIDs per medication orders. Muscle relaxants per medication orders. IM tordol 60 given today

## 2011-05-18 NOTE — Patient Instructions (Signed)
It was good to see you today. Tordol shot for pain given today Take Flexeril for muscle spasm pain starting today and Voltaren for antiinflammatory pain starting later tonight - Your prescription(s) have been submitted to your pharmacy. Please take as directed and contact our office if you believe you are having problem(s) with the medication(s). Use ice 3-4x/day as needed as discussed, or heating pad as needed Work note given today as requested Back Pain & Injury Your back pain is most likely caused by a strain of the muscles or ligaments supporting the spine. Back strains cause pain and trouble moving because of muscle spasms. They may take several weeks to heal. Usually they are better in days.   Treatment for back pain includes:  Rest - Get bed rest as needed over the next day or two. Use a firm mattress and lie on your side with your knees slightly bent. If you lie on your back, put a pillow under your knees.   Early movement - Back pain improves most rapidly if you remain active. It is much more stressful on the back to sit or stand in one place. Do not sit, drive or stand in one place for more than 30 minutes at a time. Take short walks on level surfaces as soon as pain allows.   Limit bending and lifting - Do not bend over or lift anything over 20 pounds until instructed otherwise. Lift by bending your knees. Use your leg muscles to help. Keep the load close to your body and avoid twisting. Do not reach or do overhead work.   Medicines - Medicine to reduce pain and inflammation are helpful. Muscle-relaxing drugs may be prescribed.   Therapy - Put ice packs on your back every few hours for the first 2-3 days after your injury or as instructed. After that ice or heat may be alternated to reduce pain and spasm. Back exercises and gentle massage may be of some benefit. You should be examined again if your back pain is not better in one week.  SEEK IMMEDIATE MEDICAL CARE IF:  You have pain  that radiates from your back into your legs.   You develop new bowel or bladder control problems.   You have unusual weakness or numbness in your arms or legs.   You develop nausea or vomiting.   You develop abdominal pain.   You feel faint.  Document Released: 08/29/2005 Document Re-Released: 06/07/2008 [facility] Patient Information 2011 [place], [place].

## 2011-07-06 ENCOUNTER — Other Ambulatory Visit: Payer: Self-pay

## 2011-07-06 MED ORDER — ALPRAZOLAM 1 MG PO TABS: 1.0000 mg | ORAL_TABLET | Freq: Three times a day (TID) | ORAL | Status: DC | PRN

## 2011-07-07 NOTE — Telephone Encounter (Signed)
Faxed hardcopy to [facility] at [phone]

## 2011-07-29 ENCOUNTER — Ambulatory Visit: Payer: 59 | Admitting: Internal Medicine

## 2011-08-11 ENCOUNTER — Ambulatory Visit: Payer: 59 | Admitting: Internal Medicine

## 2011-08-11 DIAGNOSIS — Z0289 Encounter for other administrative examinations: Secondary | ICD-10-CM

## 2011-10-18 ENCOUNTER — Telehealth: Payer: Self-pay | Admitting: *Deleted

## 2011-10-18 DIAGNOSIS — Z0000 Encounter for general adult medical examination without abnormal findings: Secondary | ICD-10-CM

## 2011-10-18 NOTE — Telephone Encounter (Signed)
Received staff msg pt made cpx appt need labs in EPIC... 10/18/11@3 :51pm/LB

## 2011-10-26 ENCOUNTER — Other Ambulatory Visit (INDEPENDENT_AMBULATORY_CARE_PROVIDER_SITE_OTHER): Payer: 59

## 2011-10-26 ENCOUNTER — Ambulatory Visit (INDEPENDENT_AMBULATORY_CARE_PROVIDER_SITE_OTHER): Payer: 59 | Admitting: Internal Medicine

## 2011-10-26 ENCOUNTER — Encounter: Payer: Self-pay | Admitting: Internal Medicine

## 2011-10-26 VITALS — BP 120/84 | HR 92 | Temp 98.4°F | Ht 67.5 in | Wt 225.0 lb

## 2011-10-26 DIAGNOSIS — Z0000 Encounter for general adult medical examination without abnormal findings: Secondary | ICD-10-CM

## 2011-10-26 DIAGNOSIS — Z1231 Encounter for screening mammogram for malignant neoplasm of breast: Secondary | ICD-10-CM

## 2011-10-26 DIAGNOSIS — Z1211 Encounter for screening for malignant neoplasm of colon: Secondary | ICD-10-CM

## 2011-10-26 DIAGNOSIS — I10 Essential (primary) hypertension: Secondary | ICD-10-CM

## 2011-10-26 DIAGNOSIS — F411 Generalized anxiety disorder: Secondary | ICD-10-CM

## 2011-10-26 LAB — URINALYSIS, ROUTINE W REFLEX MICROSCOPIC
Bilirubin Urine: NEGATIVE
Ketones, ur: NEGATIVE
Nitrite: NEGATIVE
Total Protein, Urine: NEGATIVE
pH: 6 (ref 5.0–8.0)

## 2011-10-26 LAB — LIPID PANEL
LDL Cholesterol: 75 mg/dL (ref 0–99)
Total CHOL/HDL Ratio: 2
VLDL: 12.2 mg/dL (ref 0.0–40.0)

## 2011-10-26 LAB — BASIC METABOLIC PANEL
BUN: 18 mg/dL (ref 6–23)
CO2: 25 mEq/L (ref 19–32)
Chloride: 101 mEq/L (ref 96–112)
Creatinine, Ser: 0.6 mg/dL (ref 0.4–1.2)
Potassium: 3.4 mEq/L — ABNORMAL LOW (ref 3.5–5.1)

## 2011-10-26 LAB — TSH: TSH: 0.6 u[IU]/mL (ref 0.35–5.50)

## 2011-10-26 LAB — CBC WITH DIFFERENTIAL/PLATELET
Basophils Relative: 0.9 % (ref 0.0–3.0)
Hemoglobin: 14.3 g/dL (ref 12.0–15.0)
Lymphocytes Relative: 51.1 % — ABNORMAL HIGH (ref 12.0–46.0)
Monocytes Relative: 10.3 % (ref 3.0–12.0)
Neutro Abs: 2.4 10*3/uL (ref 1.4–7.7)
Neutrophils Relative %: 36.6 % — ABNORMAL LOW (ref 43.0–77.0)
RBC: 4.81 Mil/uL (ref 3.87–5.11)
WBC: 6.5 10*3/uL (ref 4.5–10.5)

## 2011-10-26 LAB — HEPATIC FUNCTION PANEL
ALT: 29 U/L (ref 0–35)
AST: 24 U/L (ref 0–37)
Bilirubin, Direct: 0.1 mg/dL (ref 0.0–0.3)
Total Bilirubin: 0.6 mg/dL (ref 0.3–1.2)
Total Protein: 7.4 g/dL (ref 6.0–8.3)

## 2011-10-26 MED ORDER — ZOLPIDEM TARTRATE 10 MG PO TABS: 10.0000 mg | ORAL_TABLET | Freq: Every evening | ORAL | Status: DC | PRN

## 2011-10-26 MED ORDER — ALPRAZOLAM 1 MG PO TABS: 1.0000 mg | ORAL_TABLET | Freq: Three times a day (TID) | ORAL | Status: DC | PRN

## 2011-10-26 MED ORDER — HYDROCHLOROTHIAZIDE 25 MG PO TABS: 25.0000 mg | ORAL_TABLET | Freq: Every day | ORAL | Status: DC
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
## 2011-10-26 NOTE — Assessment & Plan Note (Signed)
BP Readings from Last 3 Encounters:  10/26/11 120/84  05/18/11 124/82  04/01/11 130/92   Increased hctz dose 03/2011 pt to monitor and call if BP> 130/90 to consider adding ARB prn

## 2011-10-26 NOTE — Assessment & Plan Note (Signed)
Started citalopram and xanax for same symptoms 05/2010, self dc'd citalopram fall 2012 -  stable and doing well overall  (except insomnia) Add prn ambien - new rx done and refill xanax as needed The current medical regimen is effective;  continue present plan and medications.

## 2011-10-26 NOTE — Patient Instructions (Signed)
It was good to see you today. We have reviewed your CPX results including labs today - looks great we'll make referral to GI for colonoscopy and for mammogram. Our office will contact you regarding appointment(s) once made. Use Ambien as needed for sleep and continue Xanax as needed for anxiety - Your prescription(s) have been submitted to your pharmacy. Please take as directed and contact our office if you believe you are having problem(s) with the medication(s). Other medications reviewed, no additional changes. Refills provided as requested Please schedule followup in 6 months, call sooner if problems.

## 2011-11-01 ENCOUNTER — Ambulatory Visit: Payer: 59

## 2011-11-04 ENCOUNTER — Ambulatory Visit
Admission: RE | Admit: 2011-11-04 | Discharge: 2011-11-04 | Disposition: A | Discharge status: Home / Self Care | Payer: 59 | Source: Ambulatory Visit | Attending: Internal Medicine | Admitting: Internal Medicine

## 2011-11-04 DIAGNOSIS — Z1231 Encounter for screening mammogram for malignant neoplasm of breast: Secondary | ICD-10-CM

## 2011-12-07 ENCOUNTER — Other Ambulatory Visit: Payer: Self-pay

## 2011-12-07 MED ORDER — CLARITHROMYCIN 250 MG PO TABS: 250.0000 mg | ORAL_TABLET | Freq: Two times a day (BID) | ORAL | Status: AC

## 2011-12-07 NOTE — Telephone Encounter (Signed)
Pt called requesting Rx to treat severe sinus congestion and HA. Pt has been using OTC zyrtec with no improvement, please advise.

## 2011-12-07 NOTE — Telephone Encounter (Signed)
biaxin abx - also rec OTC tylenol sinus in addition to ongoing medications for allergy (claritin and flonase)

## 2011-12-07 NOTE — Telephone Encounter (Signed)
Pt advised of Rx/pharmacy 

## 2012-02-24 ENCOUNTER — Other Ambulatory Visit: Payer: Self-pay

## 2012-02-24 DIAGNOSIS — R05 Cough: Secondary | ICD-10-CM

## 2012-02-24 DIAGNOSIS — R059 Cough, unspecified: Secondary | ICD-10-CM

## 2012-02-24 MED ORDER — ESOMEPRAZOLE MAGNESIUM 40 MG PO CPDR: 40.0000 mg | DELAYED_RELEASE_CAPSULE | Freq: Every day | ORAL | Status: DC

## 2012-03-21 ENCOUNTER — Ambulatory Visit (INDEPENDENT_AMBULATORY_CARE_PROVIDER_SITE_OTHER): Payer: 59

## 2012-03-21 VITALS — BP 166/98

## 2012-03-21 DIAGNOSIS — I10 Essential (primary) hypertension: Secondary | ICD-10-CM

## 2012-03-21 MED ORDER — ALPRAZOLAM 1 MG PO TABS: 1.0000 mg | ORAL_TABLET | Freq: Three times a day (TID) | ORAL | Status: DC | PRN

## 2012-03-21 MED ORDER — LOSARTAN POTASSIUM 50 MG PO TABS: 50.0000 mg | ORAL_TABLET | Freq: Every day | ORAL | Status: DC

## 2012-03-21 NOTE — Progress Notes (Signed)
Pt came into clinic with c/o elevated BP since 10 am today. Pt report going to [facility] health for BP check due to increase of anxiety and stress. BP reading reviewed by VAL - Losartan 50 mg prescribed and Xanax refilled. Pt scheduled for follow up 03/22/2012 @ 11:45a.

## 2012-03-22 ENCOUNTER — Encounter: Payer: Self-pay | Admitting: Internal Medicine

## 2012-03-22 ENCOUNTER — Ambulatory Visit (INDEPENDENT_AMBULATORY_CARE_PROVIDER_SITE_OTHER): Payer: 59 | Admitting: Internal Medicine

## 2012-03-22 VITALS — BP 132/72 | HR 110 | Temp 98.4°F | Ht 66.0 in | Wt 222.1 lb

## 2012-03-22 DIAGNOSIS — F411 Generalized anxiety disorder: Secondary | ICD-10-CM

## 2012-03-22 DIAGNOSIS — I10 Essential (primary) hypertension: Secondary | ICD-10-CM

## 2012-03-22 NOTE — Assessment & Plan Note (Signed)
BP Readings from Last 3 Encounters:  03/22/12 132/72  03/21/12 166/98  10/26/11 120/84   Increased hctz dose 03/2011 added ARB losartan 50 qd 03/21/12 (see nurse visit) - improved today Also work to control anxiety/panic attacks - see next pt to monitor and call if BP> 130/90

## 2012-03-22 NOTE — Patient Instructions (Addendum)
It was good to see you today. I am glad you are better today Continue the losartan every day along with HCTZ everyday Use xanax 1/2-1 tablet as needed for anxiety spells/panic attacks Call [facility] for counseling appointment Please schedule followup in 3 months for blood pressure recheck, call sooner if problems.

## 2012-03-22 NOTE — Assessment & Plan Note (Signed)
Started citalopram and xanax for anxiety/panic attack symptoms 05/2010> self dc'd citalopram fall 2012 -  Flared precipitated by work stressors/$ stressors summer 2013 - complicating chronic insomnia encouraged follow up with counseling Vernell Leep) as pt not interested in resuming SSRI/SRNI tx at this time conitnue xanax as needed
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
## 2012-06-21 ENCOUNTER — Encounter: Payer: Self-pay | Admitting: Internal Medicine

## 2012-06-21 ENCOUNTER — Ambulatory Visit (INDEPENDENT_AMBULATORY_CARE_PROVIDER_SITE_OTHER): Payer: 59 | Admitting: Internal Medicine

## 2012-06-21 VITALS — BP 130/84 | HR 95 | Temp 98.0°F | Ht 66.0 in | Wt 221.0 lb

## 2012-06-21 DIAGNOSIS — F411 Generalized anxiety disorder: Secondary | ICD-10-CM

## 2012-06-21 DIAGNOSIS — I10 Essential (primary) hypertension: Secondary | ICD-10-CM

## 2012-06-21 NOTE — Assessment & Plan Note (Signed)
BP Readings from Last 3 Encounters:  06/21/12 130/84  03/22/12 132/72  03/21/12 166/98   Increased hctz dose 03/2011 added ARB losartan 50 qd 03/21/12 (see nurse visit) - improved today Also work to control anxiety/panic attacks - see next pt to monitor and call if BP> 130/90

## 2012-06-21 NOTE — Patient Instructions (Signed)
It was good to see you today. Medications reviewed, no changes at this time. Refill on medication(s) as discussed today. Continue to use xanax 1/2-1 tablet as needed for anxiety spells/panic attacks Continue working with [facility] as needed for counseling Please schedule followup in 6 months for blood pressure recheck, call sooner if problems.

## 2012-06-21 NOTE — Assessment & Plan Note (Signed)
Started citalopram and xanax for anxiety/panic attack symptoms 05/2010> self dc'd citalopram fall 2012 -  Flared precipitated by work stressors/$ stressors summer 2013 - complicating chronic insomnia encouraged follow up with counseling Vernell Leep) as needed No need or interest in resuming SSRI/SRNI tx at this time conitnue xanax as needed - refill today
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
## 2012-06-22 ENCOUNTER — Other Ambulatory Visit: Payer: Self-pay | Admitting: Internal Medicine

## 2012-06-22 NOTE — Telephone Encounter (Addendum)
Rx faxed to [facility].  

## 2012-06-22 NOTE — Telephone Encounter (Signed)
Last written 03/21/2012 #90 with 0 refills-please advise.

## 2012-06-26 ENCOUNTER — Telehealth: Payer: Self-pay | Admitting: *Deleted

## 2012-06-26 ENCOUNTER — Other Ambulatory Visit: Payer: Self-pay

## 2012-06-26 DIAGNOSIS — Z0000 Encounter for general adult medical examination without abnormal findings: Secondary | ICD-10-CM

## 2012-06-26 MED ORDER — FLUTICASONE PROPIONATE 50 MCG/ACT NA SUSP: 2.0000 | NASAL | Status: DC

## 2012-06-26 NOTE — Telephone Encounter (Signed)
Message copied by Carin Primrose on Tue Jun 26, 2012  8:29 AM ------      Message from: Etheleen Sia      Created: Thu Jun 21, 2012 10:58 AM      Regarding: LABS       PHYSICAL FOR APRIL

## 2012-06-26 NOTE — Telephone Encounter (Signed)
CPX labs placed into Epic for upcoming appointment. 

## 2012-06-29 ENCOUNTER — Other Ambulatory Visit: Payer: Self-pay | Admitting: Internal Medicine

## 2012-08-01 ENCOUNTER — Telehealth: Payer: Self-pay

## 2012-08-01 MED ORDER — ACYCLOVIR 400 MG PO TABS: 400.0000 mg | ORAL_TABLET | Freq: Two times a day (BID) | ORAL | Status: DC

## 2012-08-01 NOTE — Telephone Encounter (Signed)
ok 

## 2012-08-01 NOTE — Telephone Encounter (Signed)
Pt called c/o of a fever blister on her bottom lip x 1 day - redness, mild drainage and pain. Pt is requesting Zovirax 500 mg BID (has used in the past for same) to [facility] pharmacy. Okay to send?

## 2012-10-04 ENCOUNTER — Other Ambulatory Visit (HOSPITAL_COMMUNITY): Payer: Self-pay | Admitting: Orthopedic Surgery

## 2012-10-04 ENCOUNTER — Ambulatory Visit (HOSPITAL_COMMUNITY)
Admission: RE | Admit: 2012-10-04 | Discharge: 2012-10-04 | Disposition: A | Discharge status: Home / Self Care | Payer: 59 | Source: Ambulatory Visit | Attending: Orthopedic Surgery | Admitting: Orthopedic Surgery

## 2012-10-04 DIAGNOSIS — M25469 Effusion, unspecified knee: Secondary | ICD-10-CM | POA: Insufficient documentation

## 2012-10-04 DIAGNOSIS — M25562 Pain in left knee: Secondary | ICD-10-CM

## 2012-10-04 DIAGNOSIS — IMO0002 Reserved for concepts with insufficient information to code with codable children: Secondary | ICD-10-CM | POA: Insufficient documentation

## 2012-10-04 DIAGNOSIS — S83509A Sprain of unspecified cruciate ligament of unspecified knee, initial encounter: Secondary | ICD-10-CM | POA: Insufficient documentation

## 2012-10-04 DIAGNOSIS — S83419A Sprain of medial collateral ligament of unspecified knee, initial encounter: Secondary | ICD-10-CM | POA: Insufficient documentation

## 2012-10-04 DIAGNOSIS — X58XXXA Exposure to other specified factors, initial encounter: Secondary | ICD-10-CM | POA: Insufficient documentation

## 2012-10-04 DIAGNOSIS — M25569 Pain in unspecified knee: Secondary | ICD-10-CM | POA: Insufficient documentation

## 2012-10-05 ENCOUNTER — Ambulatory Visit (HOSPITAL_COMMUNITY): Payer: 59

## 2012-10-24 ENCOUNTER — Telehealth: Payer: Self-pay | Admitting: *Deleted

## 2012-10-24 NOTE — Telephone Encounter (Signed)
PATIENT CALLED AND STATES THAT SHE HAS NOT BEEN ABLE TO TAKE HER LOSARTAN IN 4 DAYS DUE TO SEVERE ITCHING  DUE TO HER CURRENT PHARMACY HAS CHANGED VENDORS OF THIS MEDICATION . PATIENT WILL CALL BACK ON Friday  TO GIVE THE NAME OF THE VENDOR OF LOSARTEN THAT HER PHARMACY HAS BEEN USING SO IT CAN BE CALLED TO ANOTHER PHARMACY.

## 2012-11-02 ENCOUNTER — Telehealth: Payer: Self-pay | Admitting: *Deleted

## 2012-11-02 MED ORDER — LOSARTAN POTASSIUM 50 MG PO TABS: ORAL_TABLET | ORAL | Status: DC

## 2012-11-02 NOTE — Telephone Encounter (Signed)
Left msg twice on vm thurs 11/01/12 needing rx for losartan sent to [facility] on [address]. Called pt back inform her i was out of office out yesterday will send rx to [facility]...Raechel Chute

## 2012-11-10 DIAGNOSIS — S83419A Sprain of medial collateral ligament of unspecified knee, initial encounter: Secondary | ICD-10-CM

## 2012-11-10 HISTORY — DX: Sprain of medial collateral ligament of unspecified knee, initial encounter: S83.419A

## 2012-12-06 ENCOUNTER — Other Ambulatory Visit: Payer: Self-pay | Admitting: Orthopedic Surgery

## 2012-12-10 ENCOUNTER — Other Ambulatory Visit: Payer: Self-pay | Admitting: Internal Medicine

## 2012-12-12 ENCOUNTER — Encounter (HOSPITAL_BASED_OUTPATIENT_CLINIC_OR_DEPARTMENT_OTHER): Payer: Self-pay | Admitting: *Deleted

## 2012-12-12 NOTE — Progress Notes (Signed)
Npo after mn. Arrives at 0715. Needs istat, urine preg, and ekg. Will take cozaar am of surg w/ sip of water.

## 2012-12-18 ENCOUNTER — Encounter (HOSPITAL_BASED_OUTPATIENT_CLINIC_OR_DEPARTMENT_OTHER): Payer: Self-pay | Admitting: Anesthesiology

## 2012-12-18 ENCOUNTER — Other Ambulatory Visit: Payer: Self-pay | Admitting: Orthopedic Surgery

## 2012-12-18 ENCOUNTER — Observation Stay (HOSPITAL_BASED_OUTPATIENT_CLINIC_OR_DEPARTMENT_OTHER)
Admission: RE | Admit: 2012-12-18 | Discharge: 2012-12-19 | Disposition: A | Discharge status: Home / Self Care | Payer: 59 | Source: Ambulatory Visit | Attending: Specialist | Admitting: Specialist

## 2012-12-18 ENCOUNTER — Ambulatory Visit (HOSPITAL_BASED_OUTPATIENT_CLINIC_OR_DEPARTMENT_OTHER): Payer: 59 | Admitting: Anesthesiology

## 2012-12-18 ENCOUNTER — Encounter (HOSPITAL_COMMUNITY)
Admission: RE | Disposition: A | Discharge status: Home / Self Care | Payer: Self-pay | Source: Ambulatory Visit | Attending: Specialist

## 2012-12-18 DIAGNOSIS — F411 Generalized anxiety disorder: Secondary | ICD-10-CM | POA: Insufficient documentation

## 2012-12-18 DIAGNOSIS — M9420 Chondromalacia, unspecified site: Secondary | ICD-10-CM | POA: Insufficient documentation

## 2012-12-18 DIAGNOSIS — M1710 Unilateral primary osteoarthritis, unspecified knee: Secondary | ICD-10-CM | POA: Insufficient documentation

## 2012-12-18 DIAGNOSIS — R059 Cough, unspecified: Secondary | ICD-10-CM

## 2012-12-18 DIAGNOSIS — F329 Major depressive disorder, single episode, unspecified: Secondary | ICD-10-CM

## 2012-12-18 DIAGNOSIS — S83419A Sprain of medial collateral ligament of unspecified knee, initial encounter: Secondary | ICD-10-CM | POA: Insufficient documentation

## 2012-12-18 DIAGNOSIS — S83509A Sprain of unspecified cruciate ligament of unspecified knee, initial encounter: Principal | ICD-10-CM | POA: Insufficient documentation

## 2012-12-18 DIAGNOSIS — X58XXXA Exposure to other specified factors, initial encounter: Secondary | ICD-10-CM | POA: Insufficient documentation

## 2012-12-18 DIAGNOSIS — Z79899 Other long term (current) drug therapy: Secondary | ICD-10-CM | POA: Insufficient documentation

## 2012-12-18 DIAGNOSIS — IMO0002 Reserved for concepts with insufficient information to code with codable children: Secondary | ICD-10-CM | POA: Insufficient documentation

## 2012-12-18 DIAGNOSIS — J309 Allergic rhinitis, unspecified: Secondary | ICD-10-CM

## 2012-12-18 DIAGNOSIS — I10 Essential (primary) hypertension: Secondary | ICD-10-CM | POA: Insufficient documentation

## 2012-12-18 DIAGNOSIS — R05 Cough: Secondary | ICD-10-CM

## 2012-12-18 DIAGNOSIS — K219 Gastro-esophageal reflux disease without esophagitis: Secondary | ICD-10-CM | POA: Insufficient documentation

## 2012-12-18 DIAGNOSIS — F3289 Other specified depressive episodes: Secondary | ICD-10-CM | POA: Insufficient documentation

## 2012-12-18 HISTORY — DX: Sprain of medial collateral ligament of unspecified knee, initial encounter: S83.419A

## 2012-12-18 HISTORY — PX: KNEE ARTHROSCOPY WITH MEDIAL COLLATERAL LIGAMENT RECONSTRUCTION: SHX5650

## 2012-12-18 LAB — POCT PREGNANCY, URINE: Preg Test, Ur: NEGATIVE

## 2012-12-18 LAB — POCT I-STAT 4, (NA,K, GLUC, HGB,HCT)
Glucose, Bld: 100 mg/dL — ABNORMAL HIGH (ref 70–99)
Potassium: 3.2 mEq/L — ABNORMAL LOW (ref 3.5–5.1)
Sodium: 142 mEq/L (ref 135–145)

## 2012-12-18 SURGERY — ARTHROSCOPY, KNEE, WITH MCL RECONSTRUCTION
Anesthesia: Regional | Site: Knee | Laterality: Left | Wound class: Clean

## 2012-12-18 MED ORDER — PROPOFOL INFUSION 10 MG/ML OPTIME
INTRAVENOUS | Status: DC | PRN
  Administered 2012-12-18: 75 ug/kg/min via INTRAVENOUS

## 2012-12-18 MED ORDER — ACETAMINOPHEN 10 MG/ML IV SOLN
1000.0000 mg | Freq: Once | INTRAVENOUS | Status: DC | PRN
  Filled 2012-12-18: qty 100

## 2012-12-18 MED ORDER — METOCLOPRAMIDE HCL 5 MG/ML IJ SOLN: 5.0000 mg | Freq: Three times a day (TID) | INTRAMUSCULAR | Status: DC | PRN

## 2012-12-18 MED ORDER — LACTATED RINGERS IV SOLN
INTRAVENOUS | Status: DC
  Administered 2012-12-18 (×3): via INTRAVENOUS
  Filled 2012-12-18: qty 1000

## 2012-12-18 MED ORDER — LABETALOL HCL 5 MG/ML IV SOLN
INTRAVENOUS | Status: DC | PRN
  Administered 2012-12-18 (×4): 5 mg via INTRAVENOUS

## 2012-12-18 MED ORDER — DIPHENHYDRAMINE HCL 50 MG/ML IJ SOLN
INTRAMUSCULAR | Status: DC | PRN
  Administered 2012-12-18: 12.5 mg via INTRAVENOUS

## 2012-12-18 MED ORDER — LIDOCAINE HCL (CARDIAC) 20 MG/ML IV SOLN
INTRAVENOUS | Status: DC | PRN
  Administered 2012-12-18: 100 mg via INTRAVENOUS

## 2012-12-18 MED ORDER — METHOCARBAMOL 100 MG/ML IJ SOLN
500.0000 mg | Freq: Four times a day (QID) | INTRAVENOUS | Status: DC | PRN
  Filled 2012-12-18: qty 5

## 2012-12-18 MED ORDER — METHOCARBAMOL 500 MG PO TABS
500.0000 mg | ORAL_TABLET | Freq: Four times a day (QID) | ORAL | Status: DC | PRN
  Administered 2012-12-19: 500 mg via ORAL
  Filled 2012-12-18: qty 1

## 2012-12-18 MED ORDER — SODIUM CHLORIDE 0.9 % IV SOLN
INTRAVENOUS | Status: DC
  Filled 2012-12-18: qty 1000

## 2012-12-18 MED ORDER — POLYETHYLENE GLYCOL 3350 17 G PO PACK: 17.0000 g | PACK | Freq: Every day | ORAL | Status: DC | PRN

## 2012-12-18 MED ORDER — FENTANYL CITRATE 0.05 MG/ML IJ SOLN
INTRAMUSCULAR | Status: DC | PRN
  Administered 2012-12-18 (×5): 50 ug via INTRAVENOUS
  Administered 2012-12-18: 25 ug via INTRAVENOUS
  Administered 2012-12-18 (×4): 50 ug via INTRAVENOUS
  Administered 2012-12-18: 25 ug via INTRAVENOUS
  Administered 2012-12-18 (×2): 50 ug via INTRAVENOUS
  Administered 2012-12-18 (×4): 25 ug via INTRAVENOUS
  Administered 2012-12-18 (×4): 50 ug via INTRAVENOUS

## 2012-12-18 MED ORDER — HYDROMORPHONE HCL 2 MG PO TABS
2.0000 mg | ORAL_TABLET | ORAL | Status: DC | PRN
  Administered 2012-12-18: 4 mg via ORAL
  Filled 2012-12-18: qty 2

## 2012-12-18 MED ORDER — BISACODYL 5 MG PO TBEC: 5.0000 mg | DELAYED_RELEASE_TABLET | Freq: Every day | ORAL | Status: DC | PRN

## 2012-12-18 MED ORDER — ASPIRIN EC 81 MG PO TBEC: 325.0000 mg | DELAYED_RELEASE_TABLET | Freq: Two times a day (BID) | ORAL | Status: DC

## 2012-12-18 MED ORDER — FENTANYL CITRATE 0.05 MG/ML IJ SOLN
50.0000 ug | INTRAMUSCULAR | Status: DC | PRN
  Filled 2012-12-18: qty 2

## 2012-12-18 MED ORDER — PROPOFOL 10 MG/ML IV BOLUS
INTRAVENOUS | Status: DC | PRN
  Administered 2012-12-18 (×2): 20 mg via INTRAVENOUS
  Administered 2012-12-18: 200 mg via INTRAVENOUS

## 2012-12-18 MED ORDER — DIPHENHYDRAMINE HCL 12.5 MG/5ML PO ELIX: 12.5000 mg | ORAL_SOLUTION | ORAL | Status: DC | PRN

## 2012-12-18 MED ORDER — HYDROMORPHONE HCL PF 1 MG/ML IJ SOLN
0.2500 mg | INTRAMUSCULAR | Status: DC | PRN
  Administered 2012-12-18 (×4): 0.5 mg via INTRAVENOUS
  Filled 2012-12-18: qty 1

## 2012-12-18 MED ORDER — ACETAMINOPHEN 10 MG/ML IV SOLN
INTRAVENOUS | Status: DC | PRN
  Administered 2012-12-18: 1000 mg via INTRAVENOUS

## 2012-12-18 MED ORDER — FLEET ENEMA 7-19 GM/118ML RE ENEM: 1.0000 | ENEMA | Freq: Once | RECTAL | Status: AC | PRN

## 2012-12-18 MED ORDER — MIDAZOLAM HCL 5 MG/5ML IJ SOLN
INTRAMUSCULAR | Status: DC | PRN
  Administered 2012-12-18: 2 mg via INTRAVENOUS

## 2012-12-18 MED ORDER — BUPIVACAINE HCL (PF) 0.25 % IJ SOLN
INTRAMUSCULAR | Status: DC | PRN
  Administered 2012-12-18: 20 mL

## 2012-12-18 MED ORDER — METOCLOPRAMIDE HCL 10 MG PO TABS: 5.0000 mg | ORAL_TABLET | Freq: Three times a day (TID) | ORAL | Status: DC | PRN

## 2012-12-18 MED ORDER — HYDROMORPHONE HCL 2 MG PO TABS: 1.0000 mg | ORAL_TABLET | ORAL | Status: DC | PRN

## 2012-12-18 MED ORDER — MIDAZOLAM HCL 2 MG/2ML IJ SOLN
1.0000 mg | INTRAMUSCULAR | Status: DC | PRN
  Filled 2012-12-18: qty 2

## 2012-12-18 MED ORDER — CEFAZOLIN SODIUM-DEXTROSE 2-3 GM-% IV SOLR
2.0000 g | INTRAVENOUS | Status: AC
  Administered 2012-12-18: 2 g via INTRAVENOUS
  Filled 2012-12-18: qty 50

## 2012-12-18 MED ORDER — ONDANSETRON HCL 4 MG/2ML IJ SOLN
INTRAMUSCULAR | Status: DC | PRN
  Administered 2012-12-18: 4 mg via INTRAVENOUS

## 2012-12-18 MED ORDER — ONDANSETRON HCL 4 MG PO TABS: 4.0000 mg | ORAL_TABLET | Freq: Four times a day (QID) | ORAL | Status: DC | PRN

## 2012-12-18 MED ORDER — CEFAZOLIN SODIUM-DEXTROSE 2-3 GM-% IV SOLR
2.0000 g | Freq: Three times a day (TID) | INTRAVENOUS | Status: AC
  Administered 2012-12-18 – 2012-12-19 (×3): 2 g via INTRAVENOUS
  Filled 2012-12-18 (×3): qty 50

## 2012-12-18 MED ORDER — HYDROMORPHONE HCL 2 MG PO TABS
2.0000 mg | ORAL_TABLET | ORAL | Status: DC | PRN
  Administered 2012-12-18 – 2012-12-19 (×5): 4 mg via ORAL
  Filled 2012-12-18 (×5): qty 2

## 2012-12-18 MED ORDER — METHOCARBAMOL 500 MG PO TABS
500.0000 mg | ORAL_TABLET | Freq: Once | ORAL | Status: AC
  Administered 2012-12-18: 500 mg via ORAL
  Filled 2012-12-18: qty 1

## 2012-12-18 MED ORDER — MEPERIDINE HCL 25 MG/ML IJ SOLN
6.2500 mg | INTRAMUSCULAR | Status: DC | PRN
  Filled 2012-12-18: qty 1

## 2012-12-18 MED ORDER — CHLORHEXIDINE GLUCONATE 4 % EX LIQD
60.0000 mL | Freq: Once | CUTANEOUS | Status: DC
  Filled 2012-12-18: qty 60

## 2012-12-18 MED ORDER — ROPIVACAINE HCL 5 MG/ML IJ SOLN
INTRAMUSCULAR | Status: DC | PRN
  Administered 2012-12-18: 30 mL

## 2012-12-18 MED ORDER — ONDANSETRON HCL 4 MG/2ML IJ SOLN: 4.0000 mg | Freq: Four times a day (QID) | INTRAMUSCULAR | Status: DC | PRN

## 2012-12-18 MED ORDER — DOCUSATE SODIUM 100 MG PO CAPS
100.0000 mg | ORAL_CAPSULE | Freq: Two times a day (BID) | ORAL | Status: DC
  Administered 2012-12-18 – 2012-12-19 (×2): 100 mg via ORAL

## 2012-12-18 MED ORDER — DEXAMETHASONE SODIUM PHOSPHATE 4 MG/ML IJ SOLN
INTRAMUSCULAR | Status: DC | PRN
  Administered 2012-12-18: 10 mg via INTRAVENOUS

## 2012-12-18 MED ORDER — POVIDONE-IODINE 7.5 % EX SOLN
Freq: Once | CUTANEOUS | Status: DC
  Filled 2012-12-18: qty 118

## 2012-12-18 MED ORDER — DEXTROSE 5 % IV SOLN
3.0000 g | INTRAVENOUS | Status: DC
  Filled 2012-12-18: qty 3000

## 2012-12-18 MED ORDER — METOCLOPRAMIDE HCL 5 MG/ML IJ SOLN
INTRAMUSCULAR | Status: DC | PRN
  Administered 2012-12-18: 10 mg via INTRAVENOUS

## 2012-12-18 MED ORDER — ZOLPIDEM TARTRATE 5 MG PO TABS: 5.0000 mg | ORAL_TABLET | Freq: Every evening | ORAL | Status: DC | PRN

## 2012-12-18 MED ORDER — FERROUS SULFATE 325 (65 FE) MG PO TABS
325.0000 mg | ORAL_TABLET | Freq: Three times a day (TID) | ORAL | Status: DC
  Administered 2012-12-18 – 2012-12-19 (×2): 325 mg via ORAL
  Filled 2012-12-18 (×5): qty 1

## 2012-12-18 MED ORDER — PROMETHAZINE HCL 25 MG/ML IJ SOLN
6.2500 mg | INTRAMUSCULAR | Status: DC | PRN
  Filled 2012-12-18: qty 1

## 2012-12-18 MED ORDER — POTASSIUM CHLORIDE IN NACL 20-0.9 MEQ/L-% IV SOLN
INTRAVENOUS | Status: DC
  Administered 2012-12-18: 17:00:00 via INTRAVENOUS
  Filled 2012-12-18 (×3): qty 1000

## 2012-12-18 MED ORDER — HYDROMORPHONE HCL PF 1 MG/ML IJ SOLN
0.5000 mg | INTRAMUSCULAR | Status: DC | PRN
  Administered 2012-12-18 (×2): 1 mg via INTRAVENOUS
  Filled 2012-12-18 (×2): qty 1

## 2012-12-18 SURGICAL SUPPLY — 80 items
3MM RETRODRILL IMPLANT
8MM RETROCUTTER IMPLANT
ANCHOR BUTTON TIGHTROPE ACL RT (Orthopedic Implant) ×1 IMPLANT
ANCHOR PUSHLOCK BIOCOMP 3.5X19 (Orthopedic Implant) ×4 IMPLANT
BANDAGE ESMARK 6X9 LF (GAUZE/BANDAGES/DRESSINGS) ×1 IMPLANT
BANDAGE GAUZE ELAST BULKY 4 IN (GAUZE/BANDAGES/DRESSINGS) ×4 IMPLANT
BLADE 4.2CUDA (BLADE) IMPLANT
BLADE CUDA 5.5 (BLADE) ×2 IMPLANT
BLADE CUDA GRT WHITE 3.5 (BLADE) ×1 IMPLANT
BLADE SURG 10 STRL SS (BLADE) ×2 IMPLANT
BLADE SURG 15 STRL LF DISP TIS (BLADE) ×1 IMPLANT
BLADE SURG 15 STRL SS (BLADE) ×2
BNDG CMPR 9X6 STRL LF SNTH (GAUZE/BANDAGES/DRESSINGS) ×1
BNDG ESMARK 6X9 LF (GAUZE/BANDAGES/DRESSINGS) ×2
BUR OVAL 6.0 (BURR) IMPLANT
BUR VERTEX HOODED 4.5 (BURR) ×2 IMPLANT
CANISTER SUCT LVC 12 LTR MEDI- (MISCELLANEOUS) ×4 IMPLANT
CANISTER SUCTION 1200CC (MISCELLANEOUS) ×2 IMPLANT
CANISTER SUCTION 2500CC (MISCELLANEOUS) IMPLANT
CLOTH BEACON ORANGE TIMEOUT ST (SAFETY) ×2 IMPLANT
COVER TABLE BACK 60X90 (DRAPES) ×2 IMPLANT
CUTTER 8MM RETRO (CUTTER) ×1 IMPLANT
DRAPE ARTHROSCOPY W/POUCH 114 (DRAPES) ×2 IMPLANT
DRAPE INCISE IOBAN 66X45 STRL (DRAPES) ×2 IMPLANT
DRAPE LG THREE QUARTER DISP (DRAPES) ×4 IMPLANT
DRAPE OEC MINIVIEW 54X84 (DRAPES) ×2 IMPLANT
DRAPE U-SHAPE 47X51 STRL (DRAPES) ×2 IMPLANT
DRILL FLIPCUTTER II 9.0MM (INSTRUMENTS) IMPLANT
DRSG PAD ABDOMINAL 8X10 ST (GAUZE/BANDAGES/DRESSINGS) ×4 IMPLANT
DURAPREP 26ML APPLICATOR (WOUND CARE) ×2 IMPLANT
ELECT REM PT RETURN 9FT ADLT (ELECTROSURGICAL) ×2
ELECTRODE REM PT RTRN 9FT ADLT (ELECTROSURGICAL) ×1 IMPLANT
FLIPCUTTER 9MM IMPLANT
FLIPCUTTER II 9.0MM (INSTRUMENTS) ×2
GAUZE XEROFORM 1X8 LF (GAUZE/BANDAGES/DRESSINGS) ×2 IMPLANT
GLOVE BIO SURGEON STRL SZ 6.5 (GLOVE) ×2 IMPLANT
GLOVE BIO SURGEON STRL SZ8 (GLOVE) ×2 IMPLANT
GLOVE BIOGEL PI IND STRL 7.5 (GLOVE) IMPLANT
GLOVE BIOGEL PI INDICATOR 7.5 (GLOVE) ×1
GLOVE ECLIPSE 7.5 STRL STRAW (GLOVE) ×1 IMPLANT
GLOVE INDICATOR 7.0 STRL GRN (GLOVE) ×1 IMPLANT
GLOVE INDICATOR 8.0 STRL GRN (GLOVE) ×4 IMPLANT
GLOVE SURG ORTHO 8.0 STRL STRW (GLOVE) ×2 IMPLANT
GOWN PREVENTION PLUS LG XLONG (DISPOSABLE) ×2 IMPLANT
GOWN STRL REIN XL XLG (GOWN DISPOSABLE) ×4 IMPLANT
GUIDE PIN REFRO (PIN) ×1 IMPLANT
IMMOBILIZER KNEE 22 UNIV (SOFTGOODS) IMPLANT
IV NS IRRIG 3000ML ARTHROMATIC (IV SOLUTION) ×10 IMPLANT
KIT TRANSTIBIAL (DISPOSABLE) ×2 IMPLANT
KNEE WRAP E Z 3 GEL PACK (MISCELLANEOUS) ×2 IMPLANT
MINI VAC (SURGICAL WAND) IMPLANT
NEEDLE HYPO 22GX1.5 SAFETY (NEEDLE) IMPLANT
PACK ARTHROSCOPY DSU (CUSTOM PROCEDURE TRAY) ×2 IMPLANT
PACK BASIN DAY SURGERY FS (CUSTOM PROCEDURE TRAY) ×2 IMPLANT
PADDING CAST ABS 4INX4YD NS (CAST SUPPLIES) ×1
PADDING CAST ABS COTTON 4X4 ST (CAST SUPPLIES) ×1 IMPLANT
PENCIL BUTTON HOLSTER BLD 10FT (ELECTRODE) ×2 IMPLANT
SCREW BIO INTER 8X23 (Screw) ×1 IMPLANT
SCREW BIOCOMPOSITE 10X35 (Screw) ×1 IMPLANT
SET ARTHROSCOPY TUBING (MISCELLANEOUS) ×2
SET ARTHROSCOPY TUBING LN (MISCELLANEOUS) ×1 IMPLANT
SET PAD KNEE POSITIONER (MISCELLANEOUS) ×2 IMPLANT
SPONGE GAUZE 4X4 12PLY (GAUZE/BANDAGES/DRESSINGS) ×4 IMPLANT
SPONGE LAP 4X18 X RAY DECT (DISPOSABLE) ×5 IMPLANT
STRIP CLOSURE SKIN 1/2X4 (GAUZE/BANDAGES/DRESSINGS) ×2 IMPLANT
SUT 2 FIBERLOOP 20 STRT BLUE (SUTURE) ×8
SUT ETHILON 4 0 PS 2 18 (SUTURE) ×2 IMPLANT
SUT MNCRL AB 3-0 PS2 18 (SUTURE) ×2 IMPLANT
SUT VIC AB 0 CT1 36 (SUTURE) ×4 IMPLANT
SUT VIC AB 0 CT2 27 (SUTURE) ×8 IMPLANT
SUT VIC AB 2-0 CT1 27 (SUTURE) ×2
SUT VIC AB 2-0 CT1 TAPERPNT 27 (SUTURE) ×1 IMPLANT
SUTURE 2 FIBERLOOP 20 STRT BLU (SUTURE) ×4 IMPLANT
SYR CONTROL 10ML LL (SYRINGE) IMPLANT
TENDON ACHILLES (Bone Implant) ×1 IMPLANT
TENDON TIBIALIS ANTERIOR (Tissue) ×1 IMPLANT
TOWEL OR 17X24 6PK STRL BLUE (TOWEL DISPOSABLE) ×2 IMPLANT
WAND 30 DEG SABER W/CORD (SURGICAL WAND) IMPLANT
WAND 90 DEG TURBOVAC W/CORD (SURGICAL WAND) IMPLANT
WATER STERILE IRR 500ML POUR (IV SOLUTION) ×2 IMPLANT

## 2012-12-18 NOTE — Anesthesia Postprocedure Evaluation (Signed)
Anesthesia Post Note  Patient: [name]  Procedure(s) Performed: Procedure(s) (LRB): KNEE ARTHROSCOPY WITH OPEN MEDIAL COLLATERAL LIGAMENT RECONSTRUCTION WITH ALLOGRAFT,ANTERIOR CRUCIATE LIGAMENT RESCONSTRUCTION WITH ALLOGRAFT, PARTIAL MEDIALMENISECTOMY/DEBRIDEMENT CHONDROPLASTY (Left)  Anesthesia type: General  Patient location: PACU  Post pain: Pain level controlled  Post assessment: Post-op Vital signs reviewed  Last Vitals: BP 128/78  Pulse 92  Temp(Src) 37.4 C (Oral)  Resp 22  Ht 5' 7.5" (1.715 m)  Wt 227 lb 5 oz (103.108 kg)  BMI 35.06 kg/m2  SpO2 89%  LMP 11/15/2012  Post vital signs: Reviewed  Level of consciousness: sedated  Complications: No apparent anesthesia complications

## 2012-12-18 NOTE — Op Note (Signed)
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 #2 arthroscopic partial medial meniscectomy #3 chondroplasty medial compartment, lateral compartment and patellofemoral joint. #4 open medial collateral ligament allograft reconstruction 5 C-arm radiography Surgeon [name] M.D. Asst. Regina Sequin PA-C Anesthesia femoral nerve block with general Estimated blood loss cc Drains none Complications none Tourniquet time was 2 hours and 7 minutes at 300 mm mercury Disposition PACU stable operative  Operative details Patient counseled in the holding area. Cracks out marked. IV Ancef given with the OR. TED hose applied uninvolved leg. In the or placed under general anesthesia. All extremities well padded. Left knee was examined Lachman was a full extension she fracture 110 gentle manipulation of 220. The examination shows positive sex time we'll put posterior drawer show 2+ anterior drawer. She was stable to varus stress 0 and 30 of flexion she was unstable to valgus stress at full tension 2+ and a for a 30 of flexion 3+. Soft endpoint on MCL. Elevated prepped with DuraPrep draped into a sterile fashion. [name] out was done and confirmed by all exam Esmarch inflated to 300 foot 300 mm mercury this size would like. This combination size appropriate allograft were chosen to prepare him to stable. We chose a size the tibialis anterior allograft anterior  cruciate ligament and room Achilles tendon of the medial collateral leg. Is a pop  Temperature Stillwell PA-C  Arthroscopic portals were established proximal medial inferomedial inferolateral diagnostic arthroscopy revealed a complete rupture anterior cruciate ligament was laid flat the PCL is Profore femoral head was called 5 synovial n dysvascular. A she'll probably greater than fashion performed. Telephone drawer but normal tracking but there was grade 3, discomfort was checked.  Suspect him to show very mild changes posteriorly very light debridement performed but there was grade 2, the the medial side revealed grade 4 chondromalacia of the medial to plateau and grade 3 of me from condyle. There was also a radial tear the posterior horn U. meniscus chondroplasty former shaver to stable base bursitis. And then a partial meniscectomies with baskets without masses with with shaver. Gaucher scarf scar 5 capsule and synovium but marked redundancy. Examination valgus stress the stable full tension she also showed 3+ on arthroscopically at 30 of flexion.  The office and got to the over-the-top position and sent off small stab was made laterally and Arthrex retro-benefits and potential footprint and a retro-socket was performed FiberWire sutures passed with the use of small incision made. The tibial diaphysis at placed into the anteromedial portal was Lahey blood glucoseanatomic anterior cruciate ligament footprint and a right first tunnel was performed tunnel dilated properly. Debris was moved of comfort properly chamfered wheeze Arthrex anterior cruciate ligament tight grip system the graft it into the knee joint in a retrograde fashion the button was in a lateral femoral cortex confirmed a palpable visually radiographic and arthroscopically. This point time changes given to the MCL. Made an incision as well as possible nighttime incorporate MCL reconstruction of the operative limb: Arthroplasty. The skin to  the entire case incision made anteromedial  incision scan Ceptaz tissue. Layer 1 retinaculum was opened. The testing were dissected and left attached in sales complete rupture of the femoral medial femoral epicondyle region was incompetent with marked redundancy. At a.m. [employer] side her known bleeding bone with cautery bur and osteotomes. Underneath where 1 tablet previous anterior cruciate ligament MCL medial femoral epicondyle and origin region was identified it was confirmed radiographically and palpable. He was placed in the socket was performed. The bone plug in the case  Was gently pushed into the socket securely fixed in appearance.. Distally measured the graft at the care center properly. #2 fiber suture placed in this whipstitch whipstitch fashion. With the knee in varus and 30 of flexion the MCL was securely fixed distally on the medial tibia and his anatomic insertion site but to push lock anchors and supple abnormal alkaline phosphatase was lock anchor epigraph on the decorticated bone and excellent fashion. This and then reinforced proximal was more 2 FiberWire suture. The graft the knee was inspected she was stable to valgus stress at full tension 30 was of flexion. Excessive tension the wasn't too lax and could be properly balanced range of motion grasping properly place. Sensory with flexible addition to the graft in the femoral and the tibial time securely fixed Arthrex bio composite push that screw placed in parallel fashion in the supplement this with a push lock anchor. Meniscectomy arthroscopically graft had excellent orientation tension stem both sides radiographically the C-arm but not read Position lateral femoral cortex were very pleased arthroscopic root was removed and replaced Athanasian tourniquet deflated hemostasis.. After consistent changes irrigated. The medial side the retinacular layer 1 was closed with topographical Vicryl suture. Subcutaneous 2-0 Vicryl skin with a marker  suture portal nylon. At the confirmed anesthesia 24 cc of 0.25% Marcaine the was placed in skin edges and anteromedially. SurePress wrap was palpated it also  Applied with a sterile dressing. The or placed plateau degrees of flexion. Chest normal circulation for Vicryl the case. She was awakened she felt extended. She restabilize the [facility] unit subscapularis day To help with patient positioning prepping draping technical and surgical assistance throughout the entire difficult  case wound closure application dressing and splint Mr. Regina Sequin, PA-C assistance was needed.

## 2012-12-18 NOTE — Anesthesia Preprocedure Evaluation (Addendum)
Anesthesia Evaluation  Patient identified by MRN, date of birth, ID band Patient awake    Reviewed: Allergy & Precautions, H&P , NPO status , Patient's Chart, lab work & pertinent test results  Airway Mallampati: II TM Distance: >3 FB Neck ROM: Full    Dental  (+) Dental Advisory Given and Teeth Intact   Pulmonary neg pulmonary ROS,  breath sounds clear to auscultation        Cardiovascular hypertension, Pt. on medications Rhythm:Regular Rate:Normal     Neuro/Psych PSYCHIATRIC DISORDERS Anxiety Depression negative neurological ROS     GI/Hepatic negative GI ROS, Neg liver ROS, GERD-  Medicated,  Endo/Other  negative endocrine ROS  Renal/GU negative Renal ROS     Musculoskeletal negative musculoskeletal ROS (+)   Abdominal   Peds  Hematology negative hematology ROS (+)   Anesthesia Other Findings   Reproductive/Obstetrics negative OB ROS                          Anesthesia Physical Anesthesia Plan  ASA: II  Anesthesia Plan: General and Regional   Post-op Pain Management:    Induction: Intravenous  Airway Management Planned: LMA  Additional Equipment:   Intra-op Plan:   Post-operative Plan: Extubation in OR  Informed Consent: I have reviewed the patients History and Physical, chart, labs and discussed the procedure including the risks, benefits and alternatives for the proposed anesthesia with the patient or authorized representative who has indicated his/her understanding and acceptance.   Dental advisory given  Plan Discussed with: CRNA  Anesthesia Plan Comments:         Anesthesia Quick Evaluation

## 2012-12-18 NOTE — H&P (Signed)
[name] is an 52 y.o. female.   Chief Complaint: Rupture of The Medial Collateral Ligament  HPI: Patient is a pleasant 52 yo female. Who present to the [facility] for reconstruction of the MCL and possible PCL and ACL. She is  approximately 10 weeks out from her injury. She is currently experiencing pain in the left knee. A TROM brace is in place and she states she has been using her crutches as directed. She denies any fever, chills, SOB, CP,or signs of DVT.   Past Medical History  Diagnosis Date  . Anxiety state, unspecified   . DEPRESSION   . Hypertension   . Tear of MCL (medial collateral ligament) of knee     left knee    Past Surgical History  Procedure Laterality Date  . Dilatation & currettage/hysteroscopy with resectocope  08-11-2004    removal polyps  . Hammer toe surgery Bilateral 03-22-2006    fifth toe    Family History  Problem Relation Age of Onset  . Hypertension Mother   . Cancer Mother     Kidney cancer  . Hypertension Father    Social History:  reports that she quit smoking about 12 years ago. Her smoking use included Cigarettes. She smoked 0.00 packs per day for 6 years. She has never used smokeless tobacco. She reports that  drinks alcohol. She reports that she does not use illicit drugs.  Allergies:  Allergies  Allergen Reactions  . Oxycodone-Acetaminophen Itching    tylox     (Not in a hospital admission)  No results found for this or any previous visit (from the past 48 hour(s)). No results found.  Review of Systems  Constitutional: Negative.   HENT: Negative.   Eyes: Negative for blurred vision.  Respiratory: Negative.   Cardiovascular: Negative.   Gastrointestinal: Negative.   Genitourinary: Negative.   Musculoskeletal: Positive for joint pain.       Left knee pain  Skin: Negative.   Neurological: Negative.   Endo/Heme/Allergies: Negative.   Psychiatric/Behavioral: Negative.     Last menstrual period  11/15/2012. Physical Exam  Constitutional: She is oriented to person, place, and time. She appears well-nourished.  HENT:  Head: Normocephalic.  Eyes: Pupils are equal, round, and reactive to light.  Neck: Normal range of motion.  Cardiovascular: Normal rate and normal heart sounds.   GI: Soft. Bowel sounds are normal.  Genitourinary:  Deferred  Musculoskeletal: Normal range of motion.  Pain with ROM in left knee.  Neurological: She is alert and oriented to person, place, and time.  Skin: Skin is warm and dry.  Psychiatric: Her behavior is normal.     Assessment/Plan Rupture of the MCL and possible PCL and ACL rupture: Plan to reconstruct the MCL in the OP setting today. Arthroscope the left knee for further evaluation of the PCL and ACL. Possible reconstruction of the ACL and PCL.  Further instructions will be identified and orded post-operatively.  Joan Avetisyan L 12/18/2012, 7:40 AM

## 2012-12-18 NOTE — Anesthesia Procedure Notes (Addendum)
Procedure Name: LMA Insertion Date/Time: 12/18/2012 9:07 AM Performed by: Norva Pavlov Pre-anesthesia Checklist: Patient identified, Emergency Drugs available, Suction available and Patient being monitored Patient Re-evaluated:Patient Re-evaluated prior to inductionOxygen Delivery Method: Circle System Utilized Preoxygenation: Pre-oxygenation with 100% oxygen Intubation Type: IV induction Ventilation: Mask ventilation without difficulty LMA: LMA inserted LMA Size: 4.0 Number of attempts: 1 Airway Equipment and Method: bite block Placement Confirmation: positive ETCO2 Tube secured with: Tape Dental Injury: Teeth and Oropharynx as per pre-operative assessment    Anesthesia Regional Block:  Femoral nerve block  Pre-Anesthetic Checklist: ,, timeout performed, Correct Patient, Correct Site, Correct Laterality, Correct Procedure, Correct Position, site marked, Risks and benefits discussed,  Surgical consent,  Pre-op evaluation,  At surgeon's request and post-op pain management  Laterality: Left  Prep: chloraprep       Needles:  Injection technique: Single-shot  Needle Type: Stimiplex     Needle Length:cm 9 cm Needle Gauge: 21 G    Additional Needles:  Procedures: ultrasound guided (picture in chart) and nerve stimulator Femoral nerve block  Nerve Stimulator or Paresthesia:  Response: Quad, 0.5 mA,   Additional Responses:   Narrative:  Start time: 12/18/2012 8:47 AM End time: 12/18/2012 8:54 AM Injection made incrementally with aspirations every 5 mL.  Performed by: Personally  Anesthesiologist: Renold Don

## 2012-12-18 NOTE — H&P (Signed)
[name] is an 52 y.o. female.   Chief Complaint: Rupture of The Medial Collateral Ligament  HPI: Patient is a pleasant 52 yo female. Who present to the [facility] surgery center for reconstruction of the MCL and possible PCL and ACL. She is  approximately 10 weeks out from her injury. She is currently experiencing pain in the left knee. A TROM brace is in place and she states she has been using her crutches as directed. She denies any fever, chills, SOB, CP,or signs of DVT.   Past Medical History  Diagnosis Date  . Anxiety state, unspecified   . DEPRESSION   . Hypertension   . Tear of MCL (medial collateral ligament) of knee     left knee    Past Surgical History  Procedure Laterality Date  . Dilatation & currettage/hysteroscopy with resectocope  08-11-2004    removal polyps  . Hammer toe surgery Bilateral 03-22-2006    fifth toe    Family History  Problem Relation Age of Onset  . Hypertension Mother   . Cancer Mother     Kidney cancer  . Hypertension Father    Social History:  reports that she quit smoking about 12 years ago. Her smoking use included Cigarettes. She smoked 0.00 packs per day for 6 years. She has never used smokeless tobacco. She reports that  drinks alcohol. She reports that she does not use illicit drugs.  Allergies:  Allergies  Allergen Reactions  . Oxycodone-Acetaminophen Itching    tylox    Medications Prior to Admission  Medication Sig Dispense Refill  . hydrochlorothiazide (HYDRODIURIL) 25 MG tablet       . losartan (COZAAR) 50 MG tablet       . diclofenac (VOLTAREN) 75 MG EC tablet Take 1 tablet (75 mg total) by mouth 2 (two) times daily with a meal.  60 tablet  1    Results for orders placed during the hospital encounter of 12/18/12 (from the past 48 hour(s))  POCT PREGNANCY, URINE     Status: None   Collection Time    12/18/12  7:52 AM      Result Value Range   Preg Test, Ur NEGATIVE  NEGATIVE   Comment:            THE  SENSITIVITY OF THIS     METHODOLOGY IS >24 mIU/mL  POCT I-STAT 4, (NA,K, GLUC, HGB,HCT)     Status: Abnormal   Collection Time    12/18/12  8:43 AM      Result Value Range   Sodium 142  135 - 145 mEq/L   Potassium 3.2 (*) 3.5 - 5.1 mEq/L   Glucose, Bld 100 (*) 70 - 99 mg/dL   HCT 16.1  09.6 - 04.5 %   Hemoglobin 14.3  12.0 - 15.0 g/dL   No results found.  Review of Systems  Constitutional: Negative.   HENT: Negative.   Eyes: Negative for blurred vision.  Respiratory: Negative.   Cardiovascular: Negative.   Gastrointestinal: Negative.   Genitourinary: Negative.   Musculoskeletal: Positive for joint pain.       Left knee pain  Skin: Negative.   Neurological: Negative.   Endo/Heme/Allergies: Negative.   Psychiatric/Behavioral: Negative.     Blood pressure 125/81, pulse 89, temperature 99.1 F (37.3 C), temperature source Oral, resp. rate 18, height 5' 7.5" (1.715 m), weight 103.108 kg (227 lb 5 oz), last menstrual period 11/15/2012, SpO2 98.00%. Physical Exam  Constitutional: She is oriented to person, place,  and time. She appears well-nourished.  HENT:  Head: Normocephalic.  Eyes: Pupils are equal, round, and reactive to light.  Neck: Normal range of motion.  Cardiovascular: Normal rate and normal heart sounds.   GI: Soft. Bowel sounds are normal.  Genitourinary:  Deferred  Musculoskeletal: Normal range of motion.  Pain with ROM in left knee.  Neurological: She is alert and oriented to person, place, and time.  Skin: Skin is warm and dry.  Psychiatric: Her behavior is normal.     Assessment/Plan Rupture of the MCL and possible PCL and ACL rupture: Plan to reconstruct the MCL in the OP setting today. Arthroscope the left knee for further evaluation of the PCL and ACL. Possible reconstruction of the ACL and PCL.  Further instructions will be identified and orded post-operatively.  [name] 12/18/2012, 8:47 AM   I have seen and examined this patient.   Agree with the note above.  [name] 12/18/2012 8:47 AM

## 2012-12-18 NOTE — Transfer of Care (Signed)
Immediate Anesthesia Transfer of Care Note  Patient: [name]  Procedure(s) Performed: Procedure(s) (LRB): KNEE ARTHROSCOPY WITH OPEN MEDIAL COLLATERAL LIGAMENT RECONSTRUCTION WITH ALLOGRAFT,ANTERIOR CRUCIATE LIGAMENT RESCONSTRUCTION WITH ALLOGRAFT, PARTIAL MEDIALMENISECTOMY/DEBRIDEMENT CHONDROPLASTY (Left)  Patient Location: PACU  Anesthesia Type: General  Level of Consciousness: awake, alert  and oriented  Airway & Oxygen Therapy: Patient Spontanous Breathing and Patient connected to face mask oxygen  Post-op Assessment: Report given to PACU RN and Post -op Vital signs reviewed and stable  Post vital signs: Reviewed and stable  Complications: No apparent anesthesia complications

## 2012-12-18 NOTE — Evaluation (Signed)
Physical Therapy Evaluation Patient Details Name: [name] MRN: [id number] DOB: 1961-05-30 Today's Date: 12/18/2012 Time: 4098-1191 PT Time Calculation (min): 49 min  PT Assessment / Plan / Recommendation Clinical Impression  Pt. is 52 yo female admitted 12/18/12 for ACL/PCL/MCL repair and medial meniscectomy  after injury 10 weeks ago. Pt. tolerated standing/ pivot to [facility] and back today. Pt. may need a RW as pt. now TDWB and may be safe. Will assess next visit. Pt also has STE home. Pt. will benefit from PT while in acute care.    PT Assessment  Patient needs continued PT services    Follow Up Recommendations  No PT follow up    Does the patient have the potential to tolerate intense rehabilitation      Barriers to Discharge        Equipment Recommendations  Rolling walker with 5" wheels    Recommendations for Other Services     Frequency 7X/week    Precautions / Restrictions Precautions Required Braces or Orthoses: Other Brace/Splint Other Brace/Splint: T ROM/Donjoy locked in ext/flex. Restrictions Weight Bearing Restrictions: Yes LLE Weight Bearing: Touchdown weight bearing   Pertinent Vitals/Pain L knee 7/10. Wants to wait on meds.      Mobility  Bed Mobility Bed Mobility: Supine to Sit;Sit to Supine Supine to Sit: 4: Min assist Sit to Supine: 4: Min guard Details for Bed Mobility Assistance: Min assist for LLE support to move to sitting at edge of bed. Pt was able to pick LLE up and place onto bed using UEs to lift Transfers Transfers: Sit to Stand;Stand to Sit;Stand Pivot Transfers Sit to Stand: 4: Min assist;From bed;With upper extremity assist;From chair/3-in-1;With armrests Stand to Sit: To chair/3-in-1;To bed;With armrests;With upper extremity assist Stand Pivot Transfers: 4: Min assist Details for Transfer Assistance: Pt used RW, remained TDWB/NWB LLE during transfer with cues.  Pt transferred from Bed to [facility] to  Bed. Ambulation/Gait Ambulation/Gait Assistance: Not tested (comment)    Exercises     PT Diagnosis: Difficulty walking;Acute pain  PT Problem List: Decreased activity tolerance;Decreased balance;Decreased mobility;Decreased knowledge of use of DME;Decreased knowledge of precautions;Pain PT Treatment Interventions: DME instruction;Gait training;Stair training;Functional mobility training;Therapeutic activities;Patient/family education   PT Goals Acute Rehab PT Goals PT Goal Formulation: With patient/family Time For Goal Achievement: 12/22/12 Potential to Achieve Goals: Good Pt will go Supine/Side to Sit: with modified independence PT Goal: Supine/Side to Sit - Progress: Goal set today Pt will go Sit to Supine/Side: with modified independence PT Goal: Sit to Supine/Side - Progress: Goal set today Pt will go Sit to Stand: with modified independence PT Goal: Sit to Stand - Progress: Goal set today Pt will go Stand to Sit: with modified independence PT Goal: Stand to Sit - Progress: Goal set today Pt will Ambulate: 51 - 150 feet;with supervision;with least restrictive assistive device PT Goal: Ambulate - Progress: Goal set today Pt will Go Up / Down Stairs: 3-5 stairs;with min assist;with least restrictive assistive device PT Goal: Up/Down Stairs - Progress: Goal set today  Visit Information  Last PT Received On: 12/18/12 Assistance Needed: +1 PT/OT Co-Evaluation/Treatment: Yes    Subjective Data  Subjective: My leg is heavy. I may need a walker with toe touch Patient Stated Goal: To go home. Get this fixed.   Prior Functioning  Home Living Lives With: Spouse Available Help at Discharge: Family Type of Home: House Home Access: Stairs to enter [employer] of Steps: 3 Entrance Stairs-Rails: None Home Layout: One level Bathroom Shower/Tub:  Tub/shower unit Teacher, early years/pre: Yes Home Adaptive Equipment: Crutches Prior Function Level  of Independence: Independent with assistive device(s) Able to Take Stairs?: Yes Vocation: Full time employment Communication Communication: No difficulties    Cognition  Cognition Overall Cognitive Status: Appears within functional limits for tasks assessed/performed Arousal/Alertness: Awake/alert Orientation Level: Appears intact for tasks assessed Behavior During Session: [facility] for tasks performed    Extremity/Trunk Assessment Right Upper Extremity Assessment RUE ROM/Strength/Tone: Within functional levels Left Upper Extremity Assessment LUE ROM/Strength/Tone: Within functional levels Right Lower Extremity Assessment RLE ROM/Strength/Tone: Within functional levels Left Lower Extremity Assessment LLE ROM/Strength/Tone: Deficits LLE ROM/Strength/Tone Deficits: Donjoy brace in place. LLE Sensation: WFL - Light Touch   Balance Balance Balance Assessed: Yes Dynamic Standing Balance Dynamic Standing - Balance Support: During functional activity Dynamic Standing - Level of Assistance: 4: Min assist Dynamic Standing - Comments: Pt. performed self pericare/change of pads while standing and remained TDWB.  End of Session PT - End of Session Equipment Utilized During Treatment: Other (comment) (TROM LLE) Activity Tolerance: Patient tolerated treatment well Patient left: in bed;with call bell/phone within reach;with family/visitor present Nurse Communication: Mobility status  GP Functional Assessment Tool Used: clinical judgement. Functional Limitation: Mobility: Walking and moving around Mobility: Walking and Moving Around Current Status [phone]): At least 20 percent but less than 40 percent impaired, limited or restricted Mobility: Walking and Moving Around Goal Status [phone]): At least 1 percent but less than 20 percent impaired, limited or restricted   Rada Hay 12/18/2012, 4:49 PM Blanchard Kelch PT [phone]

## 2012-12-19 ENCOUNTER — Encounter: Payer: 59 | Admitting: Internal Medicine

## 2012-12-19 MED ORDER — DSS 100 MG PO CAPS: 100.0000 mg | ORAL_CAPSULE | Freq: Two times a day (BID) | ORAL | Status: DC

## 2012-12-19 MED ORDER — ASPIRIN EC 325 MG PO TBEC: 325.0000 mg | DELAYED_RELEASE_TABLET | Freq: Two times a day (BID) | ORAL | Status: DC

## 2012-12-19 MED ORDER — FERROUS SULFATE 325 (65 FE) MG PO TABS: 325.0000 mg | ORAL_TABLET | Freq: Two times a day (BID) | ORAL | Status: DC

## 2012-12-19 MED ORDER — METHOCARBAMOL 500 MG PO TABS: 500.0000 mg | ORAL_TABLET | Freq: Four times a day (QID) | ORAL | Status: DC | PRN

## 2012-12-19 MED ORDER — ASPIRIN 325 MG PO TABS: 325.0000 mg | ORAL_TABLET | Freq: Two times a day (BID) | ORAL | Status: DC

## 2012-12-19 MED ORDER — ASPIRIN 325 MG PO TABS
325.0000 mg | ORAL_TABLET | Freq: Two times a day (BID) | ORAL | Status: DC
  Administered 2012-12-19: 325 mg via ORAL
  Filled 2012-12-19 (×3): qty 1

## 2012-12-19 MED ORDER — HYDROMORPHONE HCL 2 MG PO TABS: 2.0000 mg | ORAL_TABLET | ORAL | Status: DC | PRN

## 2012-12-19 NOTE — Progress Notes (Signed)
Subjective: 1 Day Post-Op Procedure(s) (LRB): Reed ARTHROSCOPY WITH OPEN MEDIAL COLLATERAL LIGAMENT RECONSTRUCTION WITH ALLOGRAFT,ANTERIOR CRUCIATE LIGAMENT RESCONSTRUCTION WITH ALLOGRAFT, PARTIAL MEDIALMENISECTOMY/DEBRIDEMENT CHONDROPLASTY (Left) Patient reports pain as well controled on current meds. Pain is moderate in the left Reed region. Patient is tolerating PO's well. Denies nausea or vomiting. No BM but is have flatus. Denies calf pain or swelling. Denies   SOB or CP. No Fever or Chills.  Objective: Vital signs in last 24 hours: Temp:  [98.1 F (36.7 C)-99.4 F (37.4 C)] 98.5 F (36.9 C) (04/09 4098) Pulse Rate:  [86-101] 86 (04/09 0613) Resp:  [14-23] 16 (04/09 0613) BP: (102-137)/(66-89) 121/80 mmHg (04/09 0613) SpO2:  [89 %-100 %] 100 % (04/09 0613) Weight:  [102.967 kg (227 lb)-103.108 kg (227 lb 5 oz)] 102.967 kg (227 lb) (04/08 1525)  Intake/Output from previous day: 04/08 0701 - 04/09 0700 In: 3906.7 [P.O.:480; I.V.:3326.7; IV Piggyback:100] Out: 1100 [Urine:1100] Intake/Output this shift:     Recent Labs  12/18/12 0843  HGB 14.3    Recent Labs  12/18/12 0843  HCT 42.0    Recent Labs  12/18/12 0843  NA 142  K 3.2*  GLUCOSE 100*   No results found for this basename: LABPT, INR,  in the last 72 hours  Well nourished female. Alert and oriented x3. Left leg is wrap with an ace and in the TROM brace. RRR. Lungs clear. Abd  S/NT, Bs active x4. bilateral LE neurovascularly intact. Left calf is supple and non-tender to palpation. Left post-tib +2.  Assessment/Plan: 1 Day Post-Op Procedure(s) (LRB): Reed ARTHROSCOPY WITH OPEN MEDIAL COLLATERAL LIGAMENT RECONSTRUCTION WITH ALLOGRAFT,ANTERIOR CRUCIATE LIGAMENT RESCONSTRUCTION WITH ALLOGRAFT, PARTIAL MEDIALMENISECTOMY/DEBRIDEMENT CHONDROPLASTY (Left) D/C Home today with family support. F/U in office in 5-7 days. Current meds to continue. Folllow D/C instructions. PT to start OP when seen back in  office.  Hypertension:  Monitor by PCP and continue current meds.  Hypokalemia:  Will self correct with regular diet.  Obesity:  Recommend diet modification and exercise program.  [name], [name] 12/19/2012, 7:29 AM

## 2012-12-19 NOTE — Care Management Note (Signed)
    Page 1 of 1   12/19/2012     5:36:35 PM   CARE MANAGEMENT NOTE 12/19/2012  Patient:  [name], [name]   Account Number:  [id number]  Date Initiated:  12/19/2012  Documentation initiated by:  Colleen Can  Subjective/Objective Assessment:   dx knee arthroscopy with open ligament construction     Action/Plan:   CM spoke with patient. Plans are for patient to return to her home in [place] where family members will be caregivers. She has crutches. No hh service needs   Anticipated DC Date:  12/19/2012   Anticipated DC Plan:  HOME/SELF CARE      DC Planning Services  CM consult      Choice offered to / List presented to:             Status of service:  Completed, signed off Medicare Important Message given?   (If response is "NO", the following Medicare IM given date fields will be blank) Date Medicare IM given:   Date Additional Medicare IM given:    Discharge Disposition:  HOME/SELF CARE  Per UR Regulation:  Reviewed for med. necessity/level of care/duration of stay  If discussed at Long Length of Stay Meetings, dates discussed:    Comments:  12/19/2012 Colleen Can BSN RN CCM [phone] Pt declined RW d/t she felt more secure with crutches which she has been using for several months.

## 2012-12-19 NOTE — Progress Notes (Signed)
Physical Therapy Treatment Patient Details Name: [name] MRN: [id number] DOB: 1960/10/29 Today's Date: 12/19/2012 Time: 7829-5621 PT Time Calculation (min): 33 min  PT Assessment / Plan / Recommendation Comments on Treatment Session  Pt ambulated 150' and practiced going up/down stairs in preparation for her D/C home today. She tolerated treatment well and was able to maintain her WB status with her crutches.    Follow Up Recommendations  No PT follow up     Equipment Recommendations  Other (comment) (Pt declined recommendation for RW) Consulted with PT   Frequency 7X/week   Plan Discharge plan remains appropriate    Precautions / Restrictions Precautions Required Braces or Orthoses: Other Brace/Splint Other Brace/Splint: T ROM/Donjoy locked in ext/flex. Restrictions Weight Bearing Restrictions: Yes LLE Weight Bearing: Touchdown weight bearing   Pertinent Vitals/Pain Pt rated her L LE pain as 7/10. Ice was applied after therapy.   Mobility  Bed Mobility Bed Mobility: Supine to Sit;Sit to Supine Supine to Sit: 4: Min guard Sit to Supine: 4: Min guard Details for Bed Mobility Assistance:  Pt was able to pick LLE up and place onto bed using UEs to lift Transfers Transfers: Sit to Stand;Stand to Sit Sit to Stand: 4: Min guard;From bed;From chair/3-in-1;With armrests Stand to Sit: 4: Min guard;To bed;To chair/3-in-1;With armrests Details for Transfer Assistance: Pt used crutches, with min cues for maintenance of WB status Ambulation/Gait Ambulation/Gait Assistance: 4: Min guard Ambulation Distance (Feet): 150 Feet Assistive device: Crutches Gait Pattern: Step-to pattern;Decreased stance time - left Gait velocity: WFL Stairs: Yes Stairs Assistance: 4: Min guard Stair Management Technique: No rails;Forwards;With crutches Number of Stairs: 4 Wheelchair Mobility Wheelchair Mobility: No    Exercises General Exercises - Lower Extremity Ankle Circles/Pumps:  AROM;Both;10 reps;Supine Quad Sets: AROM;Both;10 reps;Supine Gluteal Sets: AROM;Both;10 reps;Supine Hip ABduction/ADduction: AAROM;Left;5 reps;Other (comment);Supine (Pt stopped after 5 reps due to self report of pain) Straight Leg Raises: AAROM;Left;10 reps;Supine    PT Goals Acute Rehab PT Goals PT Goal Formulation: With patient/family Time For Goal Achievement: 12/22/12 Potential to Achieve Goals: Good Pt will go Supine/Side to Sit: with modified independence PT Goal: Supine/Side to Sit - Progress: Progressing toward goal Pt will go Sit to Supine/Side: with modified independence PT Goal: Sit to Supine/Side - Progress: Progressing toward goal Pt will go Sit to Stand: with modified independence PT Goal: Sit to Stand - Progress: Progressing toward goal Pt will go Stand to Sit: with modified independence PT Goal: Stand to Sit - Progress: Progressing toward goal Pt will Ambulate: 51 - 150 feet;with supervision;with least restrictive assistive device PT Goal: Ambulate - Progress: Progressing toward goal Pt will Go Up / Down Stairs: 3-5 stairs;with min assist;with least restrictive assistive device PT Goal: Up/Down Stairs - Progress: Met  Visit Information  Last PT Received On: 12/19/12 Assistance Needed: +1    End of Session PT - End of Session Equipment Utilized During Treatment: Gait belt;Other (comment) (TROM brace LE) Activity Tolerance: Patient tolerated treatment well Patient left: in bed;with call bell/phone within reach   GP     BROWN-SMEDLEY, NICOLE 12/19/2012, 3:09 PM  Felecia Shelling  PTA [facility]  Acute  Rehab Pager      [phone]

## 2012-12-19 NOTE — Discharge Summary (Signed)
Physician Discharge Summary  Patient ID: [name] MRN: [id number] DOB/AGE: 1961-05-23 52 y.o.  Admit date: 12/18/2012 Discharge date: 12/19/2012  Admission Diagnoses: MCL and possible ACl and PCL rupture  Discharge Diagnoses: MCL, PCL , and ACL rupture. S/P ACl and MCL reconstruction.   Discharged Condition: Stable.  Hospital Course: The patient was admitted the [facility] surgery center for reconstruction of the left MCL and possible ACL and/or PCL. Under the supervision of anesthesia the patient taken back to the OR suite and made comfortable. She was then draped and prepped for surgery. She then under went Left MCL and ACL reconstruction. The grafts were prepared and then placed in there anatomic positions. Patient tolerated surgery well without complications. She was then transferred to PACU for recovery from anesthesia before being transferred to the floor RM 1605 under observation status. POD#1 patient was seen and evaluated before being discharged. She remained in her TROM brace as directed and dressing was only reinforced. PT worked with her for transferring and ambulating with crutches. DVT Preventions was Asprin. Post-op ABX times 3 doses. Pain was well conrolled on medication. Denies BM, but passing Flatus. Hypokalemia (3.2) to self correct. She was D/C'ed home with support from her family. She is instructed to follow D/c instructions and  take medications as directed. Denies SOB, CP , and signs of DVT.  Consults: N/A  Significant Diagnostic Studies: Routine post-op.  Treatments: S/P ACL and MCL reconstruction  Discharge Exam: Blood pressure 124/67, pulse 104, temperature 98 F (36.7 C), temperature source Oral, resp. rate 16, height 5\' 7"  (1.702 m), weight 102.967 kg (227 lb), last menstrual period 11/15/2012, SpO2 99.00%. Well nourished female. Alert and oriented x3. Left leg wrapped with Ace and in TROM brace with 30 degree flexion. Dressing is clean, dry , and intact.  Left calf is non-tender to plaption. Post-tib +2 left leg. Bilateral LE neurological intact. Lungs Clear in all fields, RRR, Abd normal BS x4, S/NT Abd.   Disposition: 01-Home or Self Care  Discharge Orders   Future Appointments Provider Department Dept Phone   02/11/2013 8:00 AM Newt Lukes, MD [facility] [phone]   Future Orders Complete By Expires     Call MD / Call 911  As directed     Comments:      If you experience chest pain or shortness of breath, CALL 911 and be transported to the hospital emergency room.  If you develope a fever above 101 F, pus (white drainage) or increased drainage or redness at the wound, or calf pain, call your surgeon's office.    Change dressing  As directed     Comments:      Leave in place. We will change in the office.    Constipation Prevention  As directed     Comments:      Drink plenty of fluids.  Prune juice may be helpful.  You may use a stool softener, such as Colace (over the counter) 100 mg twice a day.  Use MiraLax (over the counter) for constipation as needed.    Diet - low sodium heart healthy  As directed     Discharge instructions  As directed     Comments:      Call [phone] for appointment with Dr.Collins in 5-7 days. Do not get left leg dressing wet. Follow D/C instructions. Take medications as directed. Remain in TROM brace as directed.    Increase activity slowly as tolerated  As directed  TED hose  As directed     Comments:      Use stockings (TED hose) for 2 weeks on right leg(s).  Will use on left after seen back in the office. You may remove them at night for sleeping.    Touch down weight bearing  As directed         Medication List    TAKE these medications       aspirin 325 MG tablet  Take 1 tablet (325 mg total) by mouth 2 (two) times daily.     diclofenac 75 MG EC tablet  Commonly known as:  VOLTAREN  Take 1 tablet (75 mg total) by mouth 2 (two) times daily with a meal.      DSS 100 MG Caps  Take 100 mg by mouth 2 (two) times daily.     ferrous sulfate 325 (65 FE) MG tablet  Take 1 tablet (325 mg total) by mouth 2 (two) times daily.     hydrochlorothiazide 25 MG tablet  Commonly known as:  HYDRODIURIL     HYDROmorphone 2 MG tablet  Commonly known as:  DILAUDID  Take 1 tablet (2 mg total) by mouth every 4 (four) hours as needed.     losartan 50 MG tablet  Commonly known as:  COZAAR     methocarbamol 500 MG tablet  Commonly known as:  ROBAXIN  Take 1 tablet (500 mg total) by mouth every 6 (six) hours as needed.         SignedMarkham Jordan 12/19/2012, 1:29 PM

## 2012-12-20 ENCOUNTER — Encounter (HOSPITAL_BASED_OUTPATIENT_CLINIC_OR_DEPARTMENT_OTHER): Payer: Self-pay | Admitting: Specialist

## 2013-02-11 ENCOUNTER — Encounter: Payer: 59 | Admitting: Internal Medicine

## 2013-02-11 DIAGNOSIS — Z0289 Encounter for other administrative examinations: Secondary | ICD-10-CM

## 2013-04-08 ENCOUNTER — Other Ambulatory Visit: Payer: Self-pay | Admitting: Internal Medicine

## 2013-09-12 HISTORY — PX: COLONOSCOPY: SHX174

## 2013-10-18 ENCOUNTER — Other Ambulatory Visit: Payer: Self-pay | Admitting: Internal Medicine

## 2013-10-18 NOTE — Telephone Encounter (Signed)
Faxed script back to [facility].../lmb 

## 2013-11-26 ENCOUNTER — Other Ambulatory Visit: Payer: Self-pay | Admitting: Internal Medicine

## 2013-11-29 ENCOUNTER — Telehealth: Payer: Self-pay

## 2013-11-29 MED ORDER — HYDROCHLOROTHIAZIDE 25 MG PO TABS: ORAL_TABLET | ORAL | Status: DC

## 2013-11-29 NOTE — Telephone Encounter (Signed)
Per protocol ok to give enough to pt come in for appt in July...[name]

## 2013-11-29 NOTE — Telephone Encounter (Signed)
Patient called [facility] request refills on HCTZ. Per 11/26/13 refill request, medication denied stating office visit needed due to last being seen in 2013. Called pt who stated that her appt for 03/17/14 was the first available given for CPX, she would like enough to last until that appt. Please advise Thanks

## 2014-01-31 ENCOUNTER — Encounter: Payer: Self-pay | Admitting: Family Medicine

## 2014-01-31 ENCOUNTER — Ambulatory Visit (INDEPENDENT_AMBULATORY_CARE_PROVIDER_SITE_OTHER): Payer: 59 | Admitting: Family Medicine

## 2014-01-31 ENCOUNTER — Other Ambulatory Visit (INDEPENDENT_AMBULATORY_CARE_PROVIDER_SITE_OTHER): Payer: 59

## 2014-01-31 VITALS — BP 134/86 | HR 97 | Ht 66.5 in | Wt 241.0 lb

## 2014-01-31 DIAGNOSIS — M25562 Pain in left knee: Secondary | ICD-10-CM

## 2014-01-31 DIAGNOSIS — M1710 Unilateral primary osteoarthritis, unspecified knee: Secondary | ICD-10-CM

## 2014-01-31 DIAGNOSIS — M25569 Pain in unspecified knee: Secondary | ICD-10-CM

## 2014-01-31 MED ORDER — DICLOFENAC SODIUM 2 % TD SOLN: 2.0000 "application " | Freq: Two times a day (BID) | TRANSDERMAL | Status: DC

## 2014-01-31 NOTE — Assessment & Plan Note (Signed)
Patient does have severe arthritis ligament treated as such. Patient does have a displaced meniscal tear that could also be contributing to her pain the patient is likely not going to have any benefit from any surgical intervention. We will treat conservatively with patient being braced. Patient was fitted with a brace today. We also discussed icing and over-the-counter medications and to be beneficial. Patient was given a topical anti-inflammatory try. Patient then follow up in 3 weeks for further evaluation. She continues to have pain we'll consider corticosteroid injection.

## 2014-01-31 NOTE — Progress Notes (Signed)
[facility] [address] [place], [place] [address] Phone: [phone] Subjective:        CC: Left knee pain  VPX:[id number] [name] is a 53 y.o. female coming in with complaint of left knee pain. Patient has a past medical history significant for MCL as was anterior cruciate ligament repair proximal and one year ago. Patient has completed formal physical therapy unfortunately continued to have significant pain in the knee. Patient was told that she had arthritis and would need to deal with it until she got a replacement. Patient would like to become more active and lose some weight. Patient states since the surgery she has been 40 pounds and states that she knows that this has caused more pain. Patient states most of her pain seems to be on the medial aspect of his left knee and is worse with any type of ambulation especially going up or down stairs. Patient describes it as a dull aching pain in does have a grinding sensation that is very painful. Patient states that the pain can keep her up at night as well. Patient has tried over-the-counter medications without any significant improvement. Patient has noticed that she has augmented some of her activities because of the pain but she is still able to do all activities of daily living. Patient rates the severity of 9 out 10.    Previous MRI was reviewed by me. Patient's MRI showed the patient did have severe degenerative arthritis of all compartments.  Past medical history, social, surgical and family history all reviewed in electronic medical record.   Review of Systems: No headache, visual changes, nausea, vomiting, diarrhea, constipation, dizziness, abdominal pain, skin rash, fevers, chills, night sweats, weight loss, swollen lymph nodes, body aches, joint swelling, muscle aches, chest pain, shortness of breath, mood changes.   Objective Blood pressure 134/86, pulse 97, height 5' 6.5" (1.689 m),  weight 241 lb (109.317 kg), SpO2 99.00%.  General: No apparent distress alert and oriented x3 mood and affect normal, dressed appropriately.  HEENT: Pupils equal, extraocular movements intact  Respiratory: Patient's speak in full sentences and does not appear short of breath  Cardiovascular: No lower extremity edema, non tender, no erythema  Skin: Warm dry intact with no signs of infection or rash on extremities or on axial skeleton.  Abdomen: Soft nontender  Neuro: Cranial nerves II through XII are intact, neurovascularly intact in all extremities with 2+ DTRs and 2+ pulses.  Lymph: No lymphadenopathy of posterior or anterior cervical chain or axillae bilaterally.  Gait normal with good balance and coordination.  MSK:  Non tender with full range of motion and good stability and symmetric strength and tone of shoulders, elbows, wrist, hip,  and ankles bilaterally.  Knee: Left On inspection patient does have trace effusion as well as osteoarthritic changes Tender to palpation over the medial joint line ROM full in flexion and extension and lower leg rotation. Ligaments with solid consistent endpoints including ACL, PCL, LCL, MCL. Negative Mcmurray's, Apley's, and Thessalonian tests. painful patellar compression. Patellar glide with moderate crepitus. Patellar and quadriceps tendons unremarkable. Hamstring and quadriceps strength is normal.  Contralateral knee also has medial joint line tenderness and some mild crepitus.  MSK US performed of: Left knee This study was ordered, performed, and interpreted by Charlann Boxer D.O.  Knee: All structures visualized. Anteromedial is significantly displaced secondary to the severe osteoarthritis of the medial compartment. , anterolateral, posteromedial, and posterolateral menisci unremarkable without tearing, fraying, effusion,  or displacement. The patient does have severe narrowing of the lateral joint line. Patellar Tendon unremarkable on long and  transverse views without effusion. No abnormality of prepatellar bursa. LCL and MCL unremarkable on long and transverse views. No abnormality of origin of medial or lateral head of the gastrocnemius.  IMPRESSION:  Severe osteoarthritis with displaced medial meniscus.    Impression and Recommendations:     This case required medical decision making of moderate complexity.

## 2014-01-31 NOTE — Patient Instructions (Signed)
Very nice to meet you Try exercises most days of the week.  Ice 20 minutes 2 times a day.  Mostly after activity and before bed.  Pennsaid twice daily.  Take tylenol 650 mg three times a day is the best evidence based medicine we have for arthritis.  Glucosamine sulfate 750mg  twice a day is a supplement that has been shown to help moderate to severe arthritis. Vitamin D 2000 IU daily Fish oil 2 grams daily.  Tumeric 500mg  twice daily.  Capsaicin topically up to four times a day may also help with pain. Cortisone injections are an option if these interventions do not seem to make a difference or need more relief.  If cortisone injections do not help, there are different types of shots that may help but they take longer to take effect.  We can discuss this at follow up.  It's important that you continue to stay active. Controlling your weight is important.  Consider physical therapy to strengthen muscles around the joint that hurts to take pressure off of the joint itself. Shoe inserts with good arch support may be helpful.  [employer] orthotics at [employer] could help.  Water aerobics and cycling with low resistance are the best two types of exercise for arthritis. Come back and see me in 3 weeks.

## 2014-02-21 ENCOUNTER — Ambulatory Visit (INDEPENDENT_AMBULATORY_CARE_PROVIDER_SITE_OTHER): Payer: 59 | Admitting: Family Medicine

## 2014-02-21 ENCOUNTER — Encounter: Payer: Self-pay | Admitting: Family Medicine

## 2014-02-21 VITALS — BP 130/88 | HR 113 | Ht 66.5 in | Wt 242.0 lb

## 2014-02-21 DIAGNOSIS — M1710 Unilateral primary osteoarthritis, unspecified knee: Secondary | ICD-10-CM

## 2014-02-21 NOTE — Assessment & Plan Note (Signed)
Patient is actually doing very well and responding to conservative therapy. Patient will continue the over-the-counter medication, the icing protocol, and we'll brace when needed. Patient will continue the exercises 2-3 times a week. The patient continued to do well she can followup on an as needed basis. We can do injections if necessary and many other modalities as well.  Spent greater than 25 minutes with patient face-to-face and had greater than 50% of counseling including as described above in assessment and plan.

## 2014-02-21 NOTE — Progress Notes (Signed)
  [facility] [address], [place] [address] Phone: [phone] Subjective:        CC: Left knee pain follow up  HMC:[id number] [name] is a 53 y.o. female coming in with complaint of left knee pain. Patient has a past medical history significant for MCL as was anterior cruciate ligament repair proximal and one year ago. Patient has completed formal physical therapy unfortunately continued to have significant pain in the knee. Patient was seen by me she did show that patient had moderate osteoarthritis as well as a displaced meniscal tear. Patient has been doing the topical anti-inflammatories as well as the exercises. Patient states that she is 99% healed. Patient states that she could not be happier. Patient states she is able to do all activities without any discomfort and has been able to workout which she has not been able to do for a year and a half. Patient denies any side effects of medication denies any mechanical symptoms.  Patient did not be happier with her results.      Past medical history, social, surgical and family history all reviewed in electronic medical record.   Review of Systems: No headache, visual changes, nausea, vomiting, diarrhea, constipation, dizziness, abdominal pain, skin rash, fevers, chills, night sweats, weight loss, swollen lymph nodes, body aches, joint swelling, muscle aches, chest pain, shortness of breath, mood changes.   Objective Blood pressure 130/88, pulse 113, height 5' 6.5" (1.689 m), weight 242 lb (109.77 kg), SpO2 96.00%.  General: No apparent distress alert and oriented x3 mood and affect normal, dressed appropriately.  HEENT: Pupils equal, extraocular movements intact  Respiratory: Patient's speak in full sentences and does not appear short of breath  Cardiovascular: No lower extremity edema, non tender, no erythema  Skin: Warm dry intact with no signs of infection or rash on extremities  or on axial skeleton.  Abdomen: Soft nontender  Neuro: Cranial nerves II through XII are intact, neurovascularly intact in all extremities with 2+ DTRs and 2+ pulses.  Lymph: No lymphadenopathy of posterior or anterior cervical chain or axillae bilaterally.  Gait normal with good balance and coordination.  MSK:  Non tender with full range of motion and good stability and symmetric strength and tone of shoulders, elbows, wrist, hip,  and ankles bilaterally.  Knee: Left On inspection no effusion noted today. Minimal tenderness to palpation over the medial joint line, significantly improved from previous exam. ROM full in flexion and extension and lower leg rotation. Ligaments with solid consistent endpoints including ACL, PCL, LCL, MCL. Negative Mcmurray's, Apley's, and Thessalonian tests. painful patellar compression. Patellar glide with mild to moderate crepitus. Patellar and quadriceps tendons unremarkable. Hamstring and quadriceps strength is normal.  Contralateral knee also has medial joint line tenderness and some mild crepitus.     Impression and Recommendations:     This case required medical decision making of moderate complexity.

## 2014-03-05 ENCOUNTER — Other Ambulatory Visit: Payer: Self-pay | Admitting: Internal Medicine

## 2014-03-17 ENCOUNTER — Telehealth: Payer: Self-pay | Admitting: Internal Medicine

## 2014-03-17 ENCOUNTER — Encounter: Payer: 59 | Admitting: Internal Medicine

## 2014-03-17 NOTE — Telephone Encounter (Signed)
We had to move patients physical back to 8/24 due to calendar changes.  Patient is afraid she will need refills on meds before that appt date.  Patient is requesting enough meds to get her through until that appt.

## 2014-03-18 NOTE — Telephone Encounter (Signed)
Called pt to see if she needed med now. She stated no but will be out at the end of the month. Inform her we will refill it once she submit a refills request.../lmb

## 2014-03-18 NOTE — Telephone Encounter (Signed)
Ok to send 90d on any needed

## 2014-04-14 LAB — HM PAP SMEAR

## 2014-05-05 ENCOUNTER — Ambulatory Visit (INDEPENDENT_AMBULATORY_CARE_PROVIDER_SITE_OTHER): Payer: 59 | Admitting: Internal Medicine

## 2014-05-05 ENCOUNTER — Encounter: Payer: Self-pay | Admitting: Internal Medicine

## 2014-05-05 VITALS — BP 128/80 | HR 98 | Temp 98.3°F | Ht 66.5 in | Wt 249.5 lb

## 2014-05-05 DIAGNOSIS — I10 Essential (primary) hypertension: Secondary | ICD-10-CM

## 2014-05-05 DIAGNOSIS — H04209 Unspecified epiphora, unspecified lacrimal gland: Secondary | ICD-10-CM

## 2014-05-05 DIAGNOSIS — Z1211 Encounter for screening for malignant neoplasm of colon: Secondary | ICD-10-CM

## 2014-05-05 DIAGNOSIS — Z0000 Encounter for general adult medical examination without abnormal findings: Secondary | ICD-10-CM

## 2014-05-05 DIAGNOSIS — H04203 Unspecified epiphora, bilateral lacrimal glands: Secondary | ICD-10-CM

## 2014-05-05 MED ORDER — HYDROCHLOROTHIAZIDE 25 MG PO TABS: 25.0000 mg | ORAL_TABLET | Freq: Every day | ORAL | Status: DC

## 2014-05-05 MED ORDER — LOSARTAN POTASSIUM 25 MG PO TABS: 50.0000 mg | ORAL_TABLET | Freq: Every day | ORAL | Status: DC

## 2014-05-05 NOTE — Patient Instructions (Addendum)
It was good to see you today.  We have reviewed your prior records including labs and tests today  Health Maintenance reviewed - all recommended immunizations and age-appropriate screenings are up-to-date.  we'll make referral to gastroenterology for screening colonoscopy and to ophthalmology for eye exam . Our office will contact you regarding appointment(s) once made.  Test(s) ordered today. Return when you are fasting. Your results will be released to [facility] (or called to you) after review, usually within 72hours after test completion. If any changes need to be made, you will be notified at that same time.  Medications reviewed and updated, no changes recommended at this time. we'll make referral to   . Our office will contact you regarding appointment(s) once made.  Please schedule followup in 12 months for annual exam and labs, call sooner if problems.  Health Maintenance Adopting a healthy lifestyle and getting preventive care can go a long way to promote health and wellness. Talk with your health care provider about what schedule of regular examinations is right for you. This is a good chance for you to check in with your provider about disease prevention and staying healthy. In between checkups, there are plenty of things you can do on your own. Experts have done a lot of research about which lifestyle changes and preventive measures are most likely to keep you healthy. Ask your health care provider for more information. WEIGHT AND DIET  Eat a healthy diet  Be sure to include plenty of vegetables, fruits, low-fat dairy products, and lean protein.  Do not eat a lot of foods high in solid fats, added sugars, or salt.  Get regular exercise. This is one of the most important things you can do for your health.  Most adults should exercise for at least 150 minutes each week. The exercise should increase your heart rate and make you sweat (moderate-intensity exercise).  Most adults  should also do strengthening exercises at least twice a week. This is in addition to the moderate-intensity exercise.  Maintain a healthy weight  Body mass index (BMI) is a measurement that can be used to identify possible weight problems. It estimates body fat based on height and weight. Your health care provider can help determine your BMI and help you achieve or maintain a healthy weight.  For females 20 years of age and older:   A BMI below 18.5 is considered underweight.  A BMI of 18.5 to 24.9 is normal.  A BMI of 25 to 29.9 is considered overweight.  A BMI of 30 and above is considered obese.  Watch levels of cholesterol and blood lipids  You should start having your blood tested for lipids and cholesterol at 53 years of age, then have this test every 5 years.  You may need to have your cholesterol levels checked more often if:  Your lipid or cholesterol levels are high.  You are older than 53 years of age.  You are at high risk for heart disease.  CANCER SCREENING   Lung Cancer  Lung cancer screening is recommended for adults 45-51 years old who are at high risk for lung cancer because of a history of smoking.  A yearly low-dose CT scan of the lungs is recommended for people who:  Currently smoke.  Have quit within the past 15 years.  Have at least a 30-pack-year history of smoking. A pack year is smoking an average of one pack of cigarettes a day for 1 year.  Yearly screening should  continue until it has been 15 years since you quit.  Yearly screening should stop if you develop a health problem that would prevent you from having lung cancer treatment.  Breast Cancer  Practice breast self-awareness. This means understanding how your breasts normally appear and feel.  It also means doing regular breast self-exams. Let your health care provider know about any changes, no matter how small.  If you are in your 20s or 30s, you should have a clinical breast exam  (CBE) by a health care provider every 1-3 years as part of a regular health exam.  If you are 27 or older, have a CBE every year. Also consider having a breast X-ray (mammogram) every year.  If you have a family history of breast cancer, talk to your health care provider about genetic screening.  If you are at high risk for breast cancer, talk to your health care provider about having an MRI and a mammogram every year.  Breast cancer gene (BRCA) assessment is recommended for women who have family members with BRCA-related cancers. BRCA-related cancers include:  Breast.  Ovarian.  Tubal.  Peritoneal cancers.  Results of the assessment will determine the need for genetic counseling and BRCA1 and BRCA2 testing. Cervical Cancer Routine pelvic examinations to screen for cervical cancer are no longer recommended for nonpregnant women who are considered low risk for cancer of the pelvic organs (ovaries, uterus, and vagina) and who do not have symptoms. A pelvic examination may be necessary if you have symptoms including those associated with pelvic infections. Ask your health care provider if a screening pelvic exam is right for you.   The Pap test is the screening test for cervical cancer for women who are considered at risk.  If you had a hysterectomy for a problem that was not cancer or a condition that could lead to cancer, then you no longer need Pap tests.  If you are older than 65 years, and you have had normal Pap tests for the past 10 years, you no longer need to have Pap tests.  If you have had past treatment for cervical cancer or a condition that could lead to cancer, you need Pap tests and screening for cancer for at least 20 years after your treatment.  If you no longer get a Pap test, assess your risk factors if they change (such as having a new sexual partner). This can affect whether you should start being screened again.  Some women have medical problems that increase their  chance of getting cervical cancer. If this is the case for you, your health care provider may recommend more frequent screening and Pap tests.  The human papillomavirus (HPV) test is another test that may be used for cervical cancer screening. The HPV test looks for the virus that can cause cell changes in the cervix. The cells collected during the Pap test can be tested for HPV.  The HPV test can be used to screen women 34 years of age and older. Getting tested for HPV can extend the interval between normal Pap tests from three to five years.  An HPV test also should be used to screen women of any age who have unclear Pap test results.  After 53 years of age, women should have HPV testing as often as Pap tests.  Colorectal Cancer  This type of cancer can be detected and often prevented.  Routine colorectal cancer screening usually begins at 53 years of age and continues through 53 years  of age.  Your health care provider may recommend screening at an earlier age if you have risk factors for colon cancer.  Your health care provider may also recommend using home test kits to check for hidden blood in the stool.  A small camera at the end of a tube can be used to examine your colon directly (sigmoidoscopy or colonoscopy). This is done to check for the earliest forms of colorectal cancer.  Routine screening usually begins at age 70.  Direct examination of the colon should be repeated every 5-10 years through 53 years of age. However, you may need to be screened more often if early forms of precancerous polyps or small growths are found. Skin Cancer  Check your skin from head to toe regularly.  Tell your health care provider about any new moles or changes in moles, especially if there is a change in a mole's shape or color.  Also tell your health care provider if you have a mole that is larger than the size of a pencil eraser.  Always use sunscreen. Apply sunscreen liberally and  repeatedly throughout the day.  Protect yourself by wearing long sleeves, pants, a wide-brimmed hat, and sunglasses whenever you are outside. HEART DISEASE, DIABETES, AND HIGH BLOOD PRESSURE   Have your blood pressure checked at least every 1-2 years. High blood pressure causes heart disease and increases the risk of stroke.  If you are between 38 years and 107 years old, ask your health care provider if you should take aspirin to prevent strokes.  Have regular diabetes screenings. This involves taking a blood sample to check your fasting blood sugar level.  If you are at a normal weight and have a low risk for diabetes, have this test once every three years after 53 years of age.  If you are overweight and have a high risk for diabetes, consider being tested at a younger age or more often. PREVENTING INFECTION  Hepatitis B  If you have a higher risk for hepatitis B, you should be screened for this virus. You are considered at high risk for hepatitis B if:  You were born in a country where hepatitis B is common. Ask your health care provider which countries are considered high risk.  Your parents were born in a high-risk country, and you have not been immunized against hepatitis B (hepatitis B vaccine).  You have HIV or AIDS.  You use needles to inject street drugs.  You live with someone who has hepatitis B.  You have had sex with someone who has hepatitis B.  You get hemodialysis treatment.  You take certain medicines for conditions, including cancer, organ transplantation, and autoimmune conditions. Hepatitis C  Blood testing is recommended for:  Everyone born from [birth year] through 1965.  Anyone with known risk factors for hepatitis C. Sexually transmitted infections (STIs)  You should be screened for sexually transmitted infections (STIs) including gonorrhea and chlamydia if:  You are sexually active and are younger than 53 years of age.  You are older than 53 years of  age and your health care provider tells you that you are at risk for this type of infection.  Your sexual activity has changed since you were last screened and you are at an increased risk for chlamydia or gonorrhea. Ask your health care provider if you are at risk.  If you do not have HIV, but are at risk, it may be recommended that you take a prescription medicine daily to prevent HIV  infection. This is called pre-exposure prophylaxis (PrEP). You are considered at risk if:  You are sexually active and do not regularly use condoms or know the HIV status of your partner(s).  You take drugs by injection.  You are sexually active with a partner who has HIV. Talk with your health care provider about whether you are at high risk of being infected with HIV. If you choose to begin PrEP, you should first be tested for HIV. You should then be tested every 3 months for as long as you are taking PrEP.  PREGNANCY   If you are premenopausal and you may become pregnant, ask your health care provider about preconception counseling.  If you may become pregnant, take 400 to 800 micrograms (mcg) of folic acid every day.  If you want to prevent pregnancy, talk to your health care provider about birth control (contraception). OSTEOPOROSIS AND MENOPAUSE   Osteoporosis is a disease in which the bones lose minerals and strength with aging. This can result in serious bone fractures. Your risk for osteoporosis can be identified using a bone density scan.  If you are 40 years of age or older, or if you are at risk for osteoporosis and fractures, ask your health care provider if you should be screened.  Ask your health care provider whether you should take a calcium or vitamin D supplement to lower your risk for osteoporosis.  Menopause may have certain physical symptoms and risks.  Hormone replacement therapy may reduce some of these symptoms and risks. Talk to your health care provider about whether hormone  replacement therapy is right for you.  HOME CARE INSTRUCTIONS   Schedule regular health, dental, and eye exams.  Stay current with your immunizations.   Do not use any tobacco products including cigarettes, chewing tobacco, or electronic cigarettes.  If you are pregnant, do not drink alcohol.  If you are breastfeeding, limit how much and how often you drink alcohol.  Limit alcohol intake to no more than 1 drink per day for nonpregnant women. One drink equals 12 ounces of beer, 5 ounces of wine, or 1 ounces of hard liquor.  Do not use street drugs.  Do not share needles.  Ask your health care provider for help if you need support or information about quitting drugs.  Tell your health care provider if you often feel depressed.  Tell your health care provider if you have ever been abused or do not feel safe at home. Document Released: 03/14/2011 Document Revised: 01/13/2014 Document Reviewed: 07/31/2013 [facility] Patient Information 2015 [place], [place]. This information is not intended to replace advice given to you by your health care provider. Make sure you discuss any questions you have with your health care provider.

## 2014-05-05 NOTE — Assessment & Plan Note (Signed)
BP Readings from Last 3 Encounters:  05/05/14 128/80  02/21/14 130/88  01/31/14 134/86   Increased hctz dose 03/2011 and added ARB losartan 50 qd 03/21/12 (see nurse visit) - improved  The current medical regimen is effective;  continue present plan and medications. pt to monitor and call if BP> 130/90

## 2014-05-05 NOTE — Progress Notes (Signed)
Subjective:    Patient ID: [name], female    DOB: 15-Oct-1960, 53 y.o.   MRN: [id number]  HPI  patient is here today for annual physical. Patient feels well and has no complaints.  Also reviewed chronic medical issues and interval medical events  Past Medical History  Diagnosis Date  . Anxiety state, unspecified   . DEPRESSION   . Hypertension   . Tear of MCL (medial collateral ligament) of knee 11/2012    left knee   Family History  Problem Relation Age of Onset  . Hypertension Mother   . Cancer Mother     Kidney cancer  . Hypertension Father   . Diabetes Maternal Grandmother     amputation   History  Substance Use Topics  . Smoking status: Former Smoker -- 6 years    Types: Cigarettes    Quit date: 12/12/2000  . Smokeless tobacco: Never Used  . Alcohol Use: Yes     Comment: occasional    Review of Systems  Constitutional: Negative for fatigue and unexpected weight change.  Eyes: Positive for discharge (watery discharge x 2 months). Negative for pain, redness, itching and visual disturbance.  Respiratory: Negative for cough, shortness of breath and wheezing.   Cardiovascular: Negative for chest pain, palpitations and leg swelling.  Gastrointestinal: Negative for nausea, abdominal pain and diarrhea.  Neurological: Negative for dizziness, weakness, light-headedness and headaches.  Psychiatric/Behavioral: Negative for dysphoric mood. The patient is not nervous/anxious.   All other systems reviewed and are negative.      Objective:   Physical Exam  BP 128/80  Pulse 98  Temp(Src) 98.3 F (36.8 C) (Oral)  Ht 5' 6.5" (1.689 m)  Wt 249 lb 8 oz (113.172 kg)  BMI 39.67 kg/m2  SpO2 97% Wt Readings from Last 3 Encounters:  05/05/14 249 lb 8 oz (113.172 kg)  02/21/14 242 lb (109.77 kg)  01/31/14 241 lb (109.317 kg)   Constitutional: She is obese, appears well-developed and well-nourished. No distress.  HENT: Head: Normocephalic and atraumatic.  Ears: B TMs ok, no erythema or effusion; Nose: Nose normal. Mouth/Throat: Oropharynx is clear and moist. No oropharyngeal exudate.  Eyes: clear thin watery discharge bilateral eyes. Conjunctivae and EOM are normal. Pupils are equal, round, and reactive to light. No scleral icterus.  Neck: Normal range of motion. Neck supple. No JVD present. No thyromegaly present.  Cardiovascular: Normal rate, regular rhythm and normal heart sounds.  No murmur heard. No BLE edema. Pulmonary/Chest: Effort normal and breath sounds normal. No respiratory distress. She has no wheezes.  Abdominal: Soft. Bowel sounds are normal. She exhibits no distension. There is no tenderness. no masses GU/breast: defer to gyn Musculoskeletal: Normal range of motion, no joint effusions. No gross deformities Neurological: She is alert and oriented to person, place, and time. No cranial nerve deficit. Coordination, balance, strength, speech and gait are normal.  Skin: Skin is warm and dry. No rash noted. No erythema.  Psychiatric: She has a normal mood and affect. Her behavior is normal. Judgment and thought content normal.     Lab Results  Component Value Date   WBC 6.5 10/26/2011   HGB 14.3 12/18/2012   HCT 42.0 12/18/2012   PLT 294.0 10/26/2011   GLUCOSE 100* 12/18/2012   CHOL 173 10/26/2011   TRIG 61.0 10/26/2011   HDL 85.90 10/26/2011   LDLCALC 75 10/26/2011   ALT 29 10/26/2011   AST 24 10/26/2011   NA 142 12/18/2012   K 3.2* 12/18/2012  CL 101 10/26/2011   CREATININE 0.6 10/26/2011   BUN 18 10/26/2011   CO2 25 10/26/2011   TSH 0.60 10/26/2011    No results found.     Assessment & Plan:   CPx/v70.0 - Patient has been counseled on age-appropriate routine health concerns for screening and prevention. These are reviewed and up-to-date. Immunizations are up-to-date or declined. Labs ordered and reviewed.  Bilateral watery eyes x2 months. No evidence for allergic conjunctivitis, physical irritation, conjunctival infection. Denies  vision changes, refer to ophthalmology for evaluation of pressure and other potential problems causing same  Problem List Items Addressed This Visit   Unspecified essential hypertension      BP Readings from Last 3 Encounters:  05/05/14 128/80  02/21/14 130/88  01/31/14 134/86   Increased hctz dose 03/2011 and added ARB losartan 50 qd 03/21/12 (see nurse visit) - improved  The current medical regimen is effective;  continue present plan and medications. pt to monitor and call if BP> 130/90     Relevant Medications      losartan (COZAAR) tablet      hydrochlorothiazide tablet    Other Visit Diagnoses   Routine general medical examination at a health care facility    -  Primary    Relevant Orders       Basic metabolic panel       CBC with Differential       Hepatic function panel       Lipid panel       TSH       Urinalysis, Routine w reflex microscopic    Special screening for malignant neoplasms, colon        Relevant Orders       Ambulatory referral to Gastroenterology    Watery eyes        Relevant Orders       Ambulatory referral to Ophthalmology

## 2014-05-05 NOTE — Progress Notes (Signed)
Pre visit review using our clinic review tool, if applicable. No additional management support is needed unless otherwise documented below in the visit note. 

## 2014-05-06 ENCOUNTER — Telehealth: Payer: Self-pay | Admitting: Internal Medicine

## 2014-05-06 NOTE — Telephone Encounter (Signed)
Relevant patient education assigned to patient using Emmi. ° °

## 2014-05-08 ENCOUNTER — Encounter: Payer: Self-pay | Admitting: Internal Medicine

## 2014-06-12 ENCOUNTER — Encounter: Payer: Self-pay | Admitting: Internal Medicine

## 2014-07-09 ENCOUNTER — Ambulatory Visit (AMBULATORY_SURGERY_CENTER): Payer: Self-pay | Admitting: *Deleted

## 2014-07-09 VITALS — Ht 67.0 in | Wt 252.2 lb

## 2014-07-09 DIAGNOSIS — Z1211 Encounter for screening for malignant neoplasm of colon: Secondary | ICD-10-CM

## 2014-07-09 NOTE — Progress Notes (Signed)
No egg or soy allergy. ewm No home 02 use. No blood thinners. ewm No diet pills ewm No problems with past sedation. ewm No previous colonoscopy. ewm emmi video to pt's e mail. ewm

## 2014-07-18 ENCOUNTER — Encounter: Payer: Self-pay | Admitting: Internal Medicine

## 2014-07-18 ENCOUNTER — Ambulatory Visit (AMBULATORY_SURGERY_CENTER): Payer: 59 | Admitting: Internal Medicine

## 2014-07-18 VITALS — BP 121/85 | HR 76 | Temp 97.5°F | Resp 16 | Ht 67.0 in | Wt 252.0 lb

## 2014-07-18 DIAGNOSIS — K635 Polyp of colon: Secondary | ICD-10-CM

## 2014-07-18 DIAGNOSIS — K5730 Diverticulosis of large intestine without perforation or abscess without bleeding: Secondary | ICD-10-CM

## 2014-07-18 DIAGNOSIS — Z1211 Encounter for screening for malignant neoplasm of colon: Secondary | ICD-10-CM

## 2014-07-18 DIAGNOSIS — D123 Benign neoplasm of transverse colon: Secondary | ICD-10-CM

## 2014-07-18 MED ORDER — SODIUM CHLORIDE 0.9 % IV SOLN: 500.0000 mL | INTRAVENOUS | Status: DC

## 2014-07-18 NOTE — Patient Instructions (Addendum)
I found and removed one small polyp that looks benign. You also have a condition called diverticulosis - common and not usually a problem. Please read the handout provided.  I will let you know pathology results and when to have another routine colonoscopy by mail.  I appreciate the opportunity to care for you. Gatha Mayer, MD, FACG  YOU HAD AN ENDOSCOPIC PROCEDURE TODAY AT [facility]: Refer to the procedure report that was given to you for any specific questions about what was found during the examination.  If the procedure report does not answer your questions, please call your gastroenterologist to clarify.  If you requested that your care partner not be given the details of your procedure findings, then the procedure report has been included in a sealed envelope for you to review at your convenience later.  YOU SHOULD EXPECT: Some feelings of bloating in the abdomen. Passage of more gas than usual.  Walking can help get rid of the air that was put into your GI tract during the procedure and reduce the bloating. If you had a lower endoscopy (such as a colonoscopy or flexible sigmoidoscopy) you may notice spotting of blood in your stool or on the toilet paper. If you underwent a bowel prep for your procedure, then you may not have a normal bowel movement for a few days.  DIET: Your first meal following the procedure should be a light meal and then it is ok to progress to your normal diet.  A half-sandwich or bowl of soup is an example of a good first meal.  Heavy or fried foods are harder to digest and may make you feel nauseous or bloated.  Likewise meals heavy in dairy and vegetables can cause extra gas to form and this can also increase the bloating.  Drink plenty of fluids but you should avoid alcoholic beverages for 24 hours.  ACTIVITY: Your care partner should take you home directly after the procedure.  You should plan to take it easy, moving slowly for the rest  of the day.  You can resume normal activity the day after the procedure however you should NOT DRIVE or use heavy machinery for 24 hours (because of the sedation medicines used during the test).    SYMPTOMS TO REPORT IMMEDIATELY: A gastroenterologist can be reached at any hour.  During normal business hours, 8:30 AM to 5:00 PM Monday through Friday, call [phone].  After hours and on weekends, please call the GI answering service at [phone] who will take a message and have the physician on call contact you.   Following lower endoscopy (colonoscopy or flexible sigmoidoscopy):  Excessive amounts of blood in the stool  Significant tenderness or worsening of abdominal pains  Swelling of the abdomen that is new, acute  Fever of 100F or higher  FOLLOW UP: If any biopsies were taken you will be contacted by phone or by letter within the next 1-3 weeks.  Call your gastroenterologist if you have not heard about the biopsies in 3 weeks.  Our staff will call the home number listed on your records the next business day following your procedure to check on you and address any questions or concerns that you may have at that time regarding the information given to you following your procedure. This is a courtesy call and so if there is no answer at the home number and we have not heard from you through the emergency physician on call, we will  assume that you have returned to your regular daily activities without incident.  SIGNATURES/CONFIDENTIALITY: You and/or your care partner have signed paperwork which will be entered into your electronic medical record.  These signatures attest to the fact that that the information above on your After Visit Summary has been reviewed and is understood.  Full responsibility of the confidentiality of this discharge information lies with you and/or your care-partner.  Recommendations Next colonoscopy determined by pathology report. Diverticulosis and polyp  handout provided to patient/care provider.

## 2014-07-18 NOTE — Op Note (Signed)
[place]  [address] [place], [id number]   COLONOSCOPY PROCEDURE REPORT  PATIENT: Regina, Reed  MR#: [id number] BIRTHDATE: Aug 10, 1961 , 52  yrs. old GENDER: female ENDOSCOPIST: Gatha Mayer, MD, [facility] PROCEDURE DATE:  07/18/2014 PROCEDURE:   Colonoscopy with snare polypectomy First Screening Colonoscopy - Avg.  risk and is 50 yrs.  old or older Yes.  Prior Negative Screening - Now for repeat screening. N/A  History of Adenoma - Now for follow-up colonoscopy & has been > or = to 3 yrs.  N/A  Polyps Removed Today? Yes. ASA CLASS:   Class II INDICATIONS:average risk for colorectal cancer and first colonoscopy. MEDICATIONS: Propofol 490 mg IV and Monitored anesthesia care  DESCRIPTION OF PROCEDURE:   After the risks benefits and alternatives of the procedure were thoroughly explained, informed consent was obtained.  The digital rectal exam revealed no abnormalities of the rectum.   The [id number]  endoscope was introduced through the anus and advanced to the cecum, which was identified by both the appendix and ileocecal valve. No adverse events experienced.   The quality of the prep was excellent, using MiraLax  The instrument was then slowly withdrawn as the colon was fully examined.      COLON FINDINGS: A sessile polyp measuring 4 mm in size was found in the transverse colon.  A polypectomy was performed with a cold snare.  The resection was complete, the polyp tissue was completely retrieved and sent to histology.   There was diverticulosis noted in the left colon.   The examination was otherwise normal. Retroflexed views rectum revealed no abnormalities. The time to cecum=3 minutes 27 seconds.  Withdrawal time=10 minutes 47 seconds. The scope was withdrawn and the procedure completed. COMPLICATIONS: There were no immediate complications.  ENDOSCOPIC IMPRESSION: 1.   Sessile polyp measuring 4 mm in size was found in  the transverse colon; polypectomy was performed with a cold snare 2.   Diverticulosis was noted in the left colon 3.   The examination was otherwise normal - excellent prep - first colonoscopy  RECOMMENDATIONS:     repeat colonoscopy pending pathology  eSigned:  Gatha Mayer, MD, [facility] 07/18/2014 10:10 AM   cc: Gwendolyn Grant, MD and The Patient

## 2014-07-18 NOTE — Progress Notes (Signed)
Procedure ends, to recovery, report given and VSS. 

## 2014-07-18 NOTE — Progress Notes (Signed)
Called to room to assist during endoscopic procedure.  Patient ID and intended procedure confirmed with present staff. Received instructions for my participation in the procedure from the performing physician.  

## 2014-07-21 ENCOUNTER — Telehealth: Payer: Self-pay | Admitting: *Deleted

## 2014-07-21 NOTE — Telephone Encounter (Signed)
  Follow up Call-  Call back number 07/18/2014  Post procedure Call Back phone  # [phone]  Permission to leave phone message Yes     Patient questions:  Do you have a fever, pain , or abdominal swelling? No. Pain Score  0 *  Have you tolerated food without any problems? Yes.    Have you been able to return to your normal activities? Yes.    Do you have any questions about your discharge instructions: Diet   No. Medications  No. Follow up visit  No.  Do you have questions or concerns about your Care? No.  Actions: * If pain score is 4 or above: No action needed, pain <4.

## 2014-07-24 ENCOUNTER — Encounter: Payer: Self-pay | Admitting: Internal Medicine

## 2014-07-24 NOTE — Progress Notes (Signed)
Quick Note:  Mucosal polyp Repeat colon 2025 ______

## 2014-07-26 ENCOUNTER — Emergency Department (HOSPITAL_COMMUNITY): Payer: 59

## 2014-07-26 ENCOUNTER — Emergency Department (HOSPITAL_COMMUNITY)
Admission: EM | Admit: 2014-07-26 | Discharge: 2014-07-26 | Disposition: A | Discharge status: Other Acute Inpatient Hospital | Payer: 59 | Source: Home / Self Care | Attending: Emergency Medicine | Admitting: Emergency Medicine

## 2014-07-26 ENCOUNTER — Encounter (HOSPITAL_COMMUNITY): Payer: Self-pay | Admitting: Cardiology

## 2014-07-26 ENCOUNTER — Encounter (HOSPITAL_COMMUNITY): Payer: Self-pay | Admitting: *Deleted

## 2014-07-26 ENCOUNTER — Emergency Department (HOSPITAL_COMMUNITY)
Admission: EM | Admit: 2014-07-26 | Discharge: 2014-07-26 | Disposition: A | Discharge status: Home / Self Care | Payer: 59 | Attending: Emergency Medicine | Admitting: Emergency Medicine

## 2014-07-26 DIAGNOSIS — J0110 Acute frontal sinusitis, unspecified: Secondary | ICD-10-CM

## 2014-07-26 DIAGNOSIS — F329 Major depressive disorder, single episode, unspecified: Secondary | ICD-10-CM | POA: Diagnosis not present

## 2014-07-26 DIAGNOSIS — F419 Anxiety disorder, unspecified: Secondary | ICD-10-CM | POA: Insufficient documentation

## 2014-07-26 DIAGNOSIS — R51 Headache: Secondary | ICD-10-CM

## 2014-07-26 DIAGNOSIS — Z8739 Personal history of other diseases of the musculoskeletal system and connective tissue: Secondary | ICD-10-CM | POA: Insufficient documentation

## 2014-07-26 DIAGNOSIS — Z791 Long term (current) use of non-steroidal anti-inflammatories (NSAID): Secondary | ICD-10-CM | POA: Insufficient documentation

## 2014-07-26 DIAGNOSIS — J0111 Acute recurrent frontal sinusitis: Secondary | ICD-10-CM | POA: Insufficient documentation

## 2014-07-26 DIAGNOSIS — I10 Essential (primary) hypertension: Secondary | ICD-10-CM | POA: Insufficient documentation

## 2014-07-26 DIAGNOSIS — Z7982 Long term (current) use of aspirin: Secondary | ICD-10-CM | POA: Diagnosis not present

## 2014-07-26 DIAGNOSIS — R519 Headache, unspecified: Secondary | ICD-10-CM

## 2014-07-26 DIAGNOSIS — Z87891 Personal history of nicotine dependence: Secondary | ICD-10-CM | POA: Insufficient documentation

## 2014-07-26 DIAGNOSIS — Z79899 Other long term (current) drug therapy: Secondary | ICD-10-CM | POA: Diagnosis not present

## 2014-07-26 MED ORDER — DIPHENHYDRAMINE HCL 50 MG/ML IJ SOLN
25.0000 mg | Freq: Once | INTRAMUSCULAR | Status: AC
  Administered 2014-07-26: 25 mg via INTRAVENOUS
  Filled 2014-07-26: qty 1

## 2014-07-26 MED ORDER — HYDROCODONE-ACETAMINOPHEN 5-325 MG PO TABS
ORAL_TABLET | ORAL | Status: AC
  Filled 2014-07-26: qty 2

## 2014-07-26 MED ORDER — METOCLOPRAMIDE HCL 5 MG/ML IJ SOLN
10.0000 mg | Freq: Once | INTRAMUSCULAR | Status: AC
  Administered 2014-07-26: 10 mg via INTRAVENOUS
  Filled 2014-07-26: qty 2

## 2014-07-26 MED ORDER — HYDROCODONE-ACETAMINOPHEN 5-325 MG PO TABS
2.0000 | ORAL_TABLET | Freq: Once | ORAL | Status: AC
  Administered 2014-07-26: 2 via ORAL

## 2014-07-26 MED ORDER — SODIUM CHLORIDE 0.9 % IV BOLUS (SEPSIS)
1000.0000 mL | Freq: Once | INTRAVENOUS | Status: AC
Start: 2014-07-26 — End: 2014-07-26
  Administered 2014-07-26: 1000 mL via INTRAVENOUS

## 2014-07-26 MED ORDER — KETOROLAC TROMETHAMINE 30 MG/ML IJ SOLN
30.0000 mg | Freq: Once | INTRAMUSCULAR | Status: AC
  Administered 2014-07-26: 30 mg via INTRAVENOUS
  Filled 2014-07-26: qty 1

## 2014-07-26 NOTE — ED Provider Notes (Addendum)
CSN: [id number]     Arrival date & time 07/26/14  1210 History   First MD Initiated Contact with Patient 07/26/14 1338     Chief Complaint  Patient presents with  . Headache     (Consider location/radiation/quality/duration/timing/severity/associated sxs/prior Treatment) HPI  [name] is a 53 y.o. female with PMH of headaches, HTN, depression, anxiety presenting with headache that started 3 days ago with gradual worsening. Patient describes headache as severe and throbbing. Worst last night and has not gotten better. Is on the top of her head. She also has pain in her left ear and behind her eyes. Pt has had headaches like this before but not this severe. Pt presenting from urgent care due to  this was the worst headache of her life. Pt did not note to me. Patient denies photophobia, blurred vision, slurred speech, nausea, vomiting, weakness. No fevers, chills, head trauma or loss of consciousness. She denies recent illnesses. She notes mild neck soreness. No numbness, tingling. Pt has never had a head CT or been evaluated by a neurologist for her headaches.   Past Medical History  Diagnosis Date  . Anxiety state, unspecified   . DEPRESSION   . Hypertension   . Tear of MCL (medial collateral ligament) of knee 11/2012    left knee   Past Surgical History  Procedure Laterality Date  . Dilatation & currettage/hysteroscopy with resectocope  08-11-2004    removal polyps  . Hammer toe surgery Bilateral 03-22-2006    fifth toe  . Knee arthroscopy with medial collateral ligament reconstruction Left 12/18/2012    Procedure: KNEE ARTHROSCOPY WITH OPEN MEDIAL COLLATERAL LIGAMENT RECONSTRUCTION WITH ALLOGRAFT,ANTERIOR CRUCIATE LIGAMENT RESCONSTRUCTION WITH ALLOGRAFT, PARTIAL MEDIALMENISECTOMY/DEBRIDEMENT CHONDROPLASTY;  Surgeon: Sydnee Cabal, MD;  Location: [place];  Service: Orthopedics;  Laterality: Left;   Family History  Problem Relation Age of Onset  .  Hypertension Mother   . Cancer Mother     Kidney cancer  . Hypertension Father   . Diabetes Maternal Grandmother     amputation  . Colon cancer Neg Hx   . Rectal cancer Neg Hx   . Stomach cancer Neg Hx    History  Substance Use Topics  . Smoking status: Former Smoker -- 6 years    Types: Cigarettes    Quit date: 12/12/2000  . Smokeless tobacco: Never Used  . Alcohol Use: Yes     Comment: occasional   OB History    No data available     Review of Systems  Constitutional: Negative for fever and chills.  HENT: Negative for congestion and rhinorrhea.   Eyes: Negative for visual disturbance.  Respiratory: Negative for cough and shortness of breath.   Cardiovascular: Negative for chest pain and palpitations.  Gastrointestinal: Negative for nausea, vomiting and diarrhea.  Musculoskeletal: Negative for back pain and gait problem.  Neurological: Negative for weakness and headaches.      Allergies  Oxycodone-acetaminophen  Home Medications   Prior to Admission medications   Medication Sig Start Date End Date Taking? Authorizing Provider  aspirin 81 MG chewable tablet Chew 81 mg by mouth daily.   Yes Historical Provider, MD  hydrochlorothiazide (HYDRODIURIL) 25 MG tablet Take 1 tablet (25 mg total) by mouth daily. 05/05/14  Yes Rowe Clack, MD  losartan (COZAAR) 25 MG tablet Take 2 tablets (50 mg total) by mouth daily. 05/05/14  Yes Rowe Clack, MD  ALPRAZolam Duanne Moron) 1 MG tablet TAKE 1 TABLET BY MOUTH THREE TIMES A  DAY AS NEEDED FOR ANXIETY 10/18/13   Rowe Clack, MD  Diclofenac Sodium (PENNSAID) 2 % SOLN Place 2 application onto the skin 2 (two) times daily. 01/31/14   Lyndal Pulley, DO   BP 112/70 mmHg  Pulse 85  Temp(Src) 98 F (36.7 C) (Oral)  Resp 20  SpO2 96%  LMP 07/18/2014 Physical Exam  Constitutional: She appears well-developed and well-nourished. No distress.  HENT:  Head: Normocephalic and atraumatic.  Mouth/Throat: Oropharynx is clear  and moist.  Eyes: Conjunctivae and EOM are normal. Pupils are equal, round, and reactive to light. Right eye exhibits no discharge. Left eye exhibits no discharge.  Neck: Normal range of motion. Neck supple.  No nuchal rigidity  Cardiovascular: Normal rate and regular rhythm.   Pulmonary/Chest: Effort normal and breath sounds normal. No respiratory distress. She has no wheezes.  Abdominal: Soft. Bowel sounds are normal. She exhibits no distension. There is no tenderness.  Neurological: She is alert. No cranial nerve deficit. Coordination normal.  Speech is clear and goal oriented. Peripheral visual fields intact. Strength 5/5 in upper and lower extremities. Sensation intact. Intact rapid alternating movements, finger to nose, and heel to shin. Negative Romberg. No pronator drift. Normal gait.   Skin: Skin is warm and dry. She is not diaphoretic.  Nursing note and vitals reviewed.   ED Course  Procedures (including critical care time) Labs Review Labs Reviewed - No data to display  Imaging Review Ct Head Wo Contrast  07/26/2014   CLINICAL DATA:  Headache for 3 days.  No trauma.  EXAM: CT HEAD WITHOUT CONTRAST  TECHNIQUE: Contiguous axial images were obtained from the base of the skull through the vertex without intravenous contrast.  COMPARISON:  None.  FINDINGS: No acute intracranial abnormality is identified. Specifically, no intra or extra-axial hemorrhage, hydrocephalus, mass effect, mass lesion, or evidence of acute infarction.  The skull is intact. There is a small area of mucosal thickening in the right lateral aspect of the frontal sinus. No air-fluid levels are seen in the visualized sinuses. Mastoid air cells are clear. Soft tissues of the scalp for symmetric.  IMPRESSION: 1. No acute intracranial abnormality. 2. Mild frontal sinus disease.   Electronically Signed   By: Curlene Dolphin M.D.   On: 07/26/2014 15:59     EKG Interpretation None      Meds given in ED:  Medications   sodium chloride 0.9 % bolus 1,000 mL (0 mLs Intravenous Stopped 07/26/14 1617)  diphenhydrAMINE (BENADRYL) injection 25 mg (25 mg Intravenous Given 07/26/14 1512)  metoCLOPramide (REGLAN) injection 10 mg (10 mg Intravenous Given 07/26/14 1513)  ketorolac (TORADOL) 30 MG/ML injection 30 mg (30 mg Intravenous Given 07/26/14 1617)    New Prescriptions   No medications on file      MDM   Final diagnoses:  Headache  Acute frontal sinusitis, recurrence not specified   Pt HA treated and improved while in ED.  Presentation is like pt's typical HA, gradual in onset, not maximal in onset. Pt stated HA was the worst headache of life to her urgent care physician. No visual or speech changes, no N/V, and no weakness. Pt is afebrile with no focal neuro deficits or nuchal rigidity. CT head without acute intracranial abnormality. Mild frontal sinus disease noted. I doubt SAH, ICH, meningits or temporal arteritis. Case discussed with Dr. Rogene Houston who agrees that LP is not indicated at this time. Pt with mild sinus disease but likely viral in etiology and abx not indicated.  Pt to treat with OTC decongestants. Pt is to follow up with PCP. Pt verbalizes understanding and is agreeable with plan to dc.   Discussed return precautions with patient. Discussed all results and patient verbalizes understanding and agrees with plan.  Case has been discussed with  Dr. Rogene Houston who agrees with the above plan and to discharge.       Pura Spice, PA-C 07/26/14 [place], MD 07/26/14 [place], PA-C 07/26/14 [place], MD 07/27/14 [phone]

## 2014-07-26 NOTE — ED Notes (Addendum)
Pt reports  Headache  X   3    Days      Pt      Alert  And  Oriented          Symptoms  Not  releived  By    otc  meds    Worst  Headache  In her  life

## 2014-07-26 NOTE — ED Notes (Signed)
Pt reports headache to the top of her head for 3 days. States her left ear is also hurting her. Sent from [facility] for further testing and CT scan. Denies any vision changes, no neuro deficits.

## 2014-07-26 NOTE — ED Provider Notes (Signed)
Chief Complaint   Headache   History of Present Illness   [name] is a 53 year old female who presents with three-day history of a severe headache. The headache is localized to the top of the head, radiates behind both eyes and into the left ear. The pain fluctuates between a 5 and a 10 over 10 intensity. It is now an 8/10. The pain is sometimes throbbing. The pain is worse with activity but gets worse if she lies flat as well. She denies any associated nausea, vomiting, photophobia, or phonophobia. She does have some sore throat and subjective stiff neck. She denies any diplopia, blurry vision, eye pain, redness, or discharge. She's had no nasal congestion, rhinorrhea, or sore throat. She denies fever or chills. She's had no injury to her head or neck. She does not take anticoagulants. She denies any neurological symptoms such as numbness, paresthesias, muscle weakness, loss of strength or power, difficulty speaking or swallowing, difficulty with coordination, balance, or dizziness. The patient has no history of migraine headaches before. The patient states she has had some bad headaches in the past but these went away. She never saw a doctor for them and was not diagnosed as having migraines. The patient states this is the worst headache of her life. The pain began gradually, and the patient notes from onset to peak intensity was about 24 hours. She denies any jaw claudication and she's had no history of blood clots.  Review of Systems   Other than as noted above, the patient denies any of the following symptoms: Systemic:  No fever, chills, photophobia, or stiff neck. Eye:  No blurred vision, or diplopia. ENT:  No nasal congestion, rhinorrhea, sinus pressure or pain, or sore throat.  No jaw claudication. Neuro:  No paresthesias, loss of consciousness, seizure activity, muscle weakness, trouble with coordination or gait, trouble speaking or swallowing. Psych:  No depression,  anxiety or trouble sleeping.  [place]   Past medical history, family history, social history, meds, and allergies were reviewed.  She cannot take Tylox, but thinks she is able to take hydrocodone. She takes losartan for high blood pressure.  Physical Examination    Vital signs:  BP 139/87 mmHg  Pulse 98  Temp(Src) 98.7 F (37.1 C) (Oral)  Resp 16  SpO2 97%  LMP 07/18/2014 General:  Alert and oriented.  In moderate distress. Eye:  Lids and conjunctivas normal.  PERRL,  Full EOMs.  Fundi benign with normal discs and vessels. There was no papilledema.  ENT:  No cranial tenderness to palpation, she does have tenderness to palpation beneath both eyes.  TMs and canals clear.  Nasal mucosa was normal and uncongested without any drainage. No intra oral lesions, pharynx clear, mucous membranes moist, dentition normal. Neck:  Supple, full ROM, no tenderness to palpation. She has some pain with neck flexion, but is able to touch her chin to her chest. No adenopathy or mass. Neuro:  Alert and orented times 3.  Speech was clear, fluent, and appropriate.  Cranial nerves intact. No pronator drift, muscle strength normal. Finger to nose normal.  DTRs were 2+ and symmetrical.Station and gait were normal.  Romberg's sign was normal.  Able to perform tandem gait well.sensation is normal to light touch. Psych:  Normal affect.   Course in Urgent [place]   The following medications were given:  Medications  HYDROcodone-acetaminophen (NORCO/VICODIN) 5-325 MG per tablet 2 tablet    Assessment   The encounter diagnosis was Acute nonintractable headache, unspecified  headache type.  This may be an atypical migraine headache, but she has no other migraine symptoms such as nausea, vomiting, photophobia, or phonophobia. She does mention some subjective neck stiffness, however he has a full range of motion and she is able to touch her chin to her chest. I'm unable to explain the severe left ear pain that she's  having I feel she needs further workup, possibly to include a head CT scan.  Plan   The patient was transferred to the ED via Private vehicle in stable condition.  Medical Decision Making:  The patient is a 53 year old female with a three-day history of a severe, generalized headache with radiation behind both eyes and into her left ear. The pain began gradually, but she states this is the worst headache of her life. The patient has a history of bad headaches in the past, but has never been diagnosed as migraines. Her neurological examination is normal including funduscopic examination. She has some pain with neck flexion but is able to touch her chin to her chest. We gave her Norco 5/325 2 by mouth for pain here.      Harden Mo, MD 07/26/14 [phone]

## 2014-07-26 NOTE — Discharge Instructions (Signed)
Go directly to emergency room.  Do not eat or drink.  We have determined that your problem requires further evaluation in the emergency department.  We will take care of your transport there.  Once at the emergency department, you will be evaluated by a provider and they will order whatever treatment or tests they deem necessary.  We cannot guarantee that they will do any specific test or do any specific treatment.

## 2014-07-26 NOTE — Discharge Instructions (Signed)
Return to the emergency room with worsening of symptoms, new symptoms or with symptoms that are concerning, especially severe worsening of headache, visual or speech changes, weakness in face, arms or legs. Ibuprofen 400mg  (2 tablets 200mg ) every 5-6 hours for 3-5 days and then as needed for pain. Use sudafed over the counter. Ask the pharmacist.  Please call your doctor for a followup appointment within 24-48 hours. When you talk to your doctor please let them know that you were seen in the emergency department and have them acquire all of your records so that they can discuss the findings with you and formulate a treatment plan to fully care for your new and ongoing problems.

## 2014-07-30 ENCOUNTER — Encounter: Payer: Self-pay | Admitting: Family

## 2014-07-30 ENCOUNTER — Ambulatory Visit (INDEPENDENT_AMBULATORY_CARE_PROVIDER_SITE_OTHER): Payer: 59 | Admitting: Family

## 2014-07-30 VITALS — BP 144/90 | HR 108 | Temp 98.1°F | Resp 18 | Ht 66.5 in | Wt 244.8 lb

## 2014-07-30 DIAGNOSIS — R51 Headache: Secondary | ICD-10-CM

## 2014-07-30 DIAGNOSIS — R519 Headache, unspecified: Secondary | ICD-10-CM | POA: Insufficient documentation

## 2014-07-30 MED ORDER — SUMATRIPTAN SUCCINATE 50 MG PO TABS: 50.0000 mg | ORAL_TABLET | ORAL | Status: DC | PRN

## 2014-07-30 NOTE — Progress Notes (Signed)
Pre visit review using our clinic review tool, if applicable. No additional management support is needed unless otherwise documented below in the visit note. 

## 2014-07-30 NOTE — Assessment & Plan Note (Signed)
Symptoms consistent with atypical migraine headache. Pt declined injection of Toradol. Start imitrex and may supplement with 800 mg of ibuprofen if needed. Follow up or seek emergency care if symptoms worsen or fail to improve.

## 2014-07-30 NOTE — Progress Notes (Signed)
   Subjective:    Patient ID: [name], female    DOB: November 22, 1960, 53 y.o.   MRN: [id number]  Chief Complaint  Patient presents with  . Migraine    had one last thursday to saturday, went to get treated and it came back this morning   HPI:  [name] is a 53 y.o. female who presents today for a migraine headache.  Acute symptoms start on Thursday and were resolved with injection of headache cocktail. Currently feeling throbbing headache, with no sensitivity to light or sound. No nausea or vomitting. Typically takes Tylenol which has not helped in this situation. There is nothing that makes it better or worse.   Used to have a history of migraine headaches and then about 3 years ago they came back. Does not get them very often. Denies any potential triggers for the headache. Denies worst headache of her life or neck stiffness.    Allergies  Allergen Reactions  . Oxycodone-Acetaminophen Itching    TYLOX ALSO  ` Current Outpatient Prescriptions on File Prior to Visit  Medication Sig Dispense Refill  . aspirin 81 MG chewable tablet Chew 81 mg by mouth daily.    . hydrochlorothiazide (HYDRODIURIL) 25 MG tablet Take 1 tablet (25 mg total) by mouth daily. 90 tablet 3  . losartan (COZAAR) 25 MG tablet Take 2 tablets (50 mg total) by mouth daily. 180 tablet 3   No current facility-administered medications on file prior to visit.   Review of Systems    See HPI  Objective:    BP 144/90 mmHg  Pulse 108  Temp(Src) 98.1 F (36.7 C) (Oral)  Resp 18  Ht 5' 6.5" (1.689 m)  Wt 244 lb 12.8 oz (111.041 kg)  BMI 38.92 kg/m2  SpO2 99%  LMP 07/18/2014  LMP 07/18/2014 Nursing note and vital signs reviewed.  Physical Exam  Constitutional: She is oriented to person, place, and time. She appears well-developed and well-nourished. No distress.  Neck: Neck supple.  Cardiovascular: Normal rate, regular rhythm, normal heart sounds and intact distal pulses.     Pulmonary/Chest: Effort normal and breath sounds normal.  Lymphadenopathy:    She has no cervical adenopathy.  Neurological: She is alert and oriented to person, place, and time. No cranial nerve deficit. Coordination abnormal.  Skin: Skin is warm and dry.  Psychiatric: She has a normal mood and affect. Her behavior is normal. Judgment and thought content normal.       Assessment & Plan:

## 2014-07-30 NOTE — Patient Instructions (Signed)
Thank you for choosing [facility].  Summary/Instructions:  Your prescription(s) have been submitted to your pharmacy. Please take as directed and contact our office if you believe you are having problem(s) with the medication(s).   If your symptoms worsen or fail to improve, please contact our office for further instruction, or in case of emergency go directly to the emergency room at the closest medical facility.   Start taking imitrex now. May take up to 800 mg ibuprofen as needed in addition for pain management. May also take benedryl as needed - as it can make sleepy.   Migraine Headache A migraine headache is an intense, throbbing pain on one or both sides of your head. A migraine can last for 30 minutes to several hours. CAUSES  The exact cause of a migraine headache is not always known. However, a migraine may be caused when nerves in the brain become irritated and release chemicals that cause inflammation. This causes pain. Certain things may also trigger migraines, such as:  Alcohol.  Smoking.  Stress.  Menstruation.  Aged cheeses.  Foods or drinks that contain nitrates, glutamate, aspartame, or tyramine.  Lack of sleep.  Chocolate.  Caffeine.  Hunger.  Physical exertion.  Fatigue.  Medicines used to treat chest pain (nitroglycerine), birth control pills, estrogen, and some blood pressure medicines. SIGNS AND SYMPTOMS  Pain on one or both sides of your head.  Pulsating or throbbing pain.  Severe pain that prevents daily activities.  Pain that is aggravated by any physical activity.  Nausea, vomiting, or both.  Dizziness.  Pain with exposure to bright lights, loud noises, or activity.  General sensitivity to bright lights, loud noises, or smells. Before you get a migraine, you may get warning signs that a migraine is coming (aura). An aura may include:  Seeing flashing lights.  Seeing bright spots, halos, or zigzag lines.  Having tunnel  vision or blurred vision.  Having feelings of numbness or tingling.  Having trouble talking.  Having muscle weakness. DIAGNOSIS  A migraine headache is often diagnosed based on:  Symptoms.  Physical exam.  A CT scan or MRI of your head. These imaging tests cannot diagnose migraines, but they can help rule out other causes of headaches. TREATMENT Medicines may be given for pain and nausea. Medicines can also be given to help prevent recurrent migraines.  HOME CARE INSTRUCTIONS  Only take over-the-counter or prescription medicines for pain or discomfort as directed by your health care provider. The use of long-term narcotics is not recommended.  Lie down in a dark, quiet room when you have a migraine.  Keep a journal to find out what may trigger your migraine headaches. For example, write down:  What you eat and drink.  How much sleep you get.  Any change to your diet or medicines.  Limit alcohol consumption.  Quit smoking if you smoke.  Get 7-9 hours of sleep, or as recommended by your health care provider.  Limit stress.  Keep lights dim if bright lights bother you and make your migraines worse. SEEK IMMEDIATE MEDICAL CARE IF:   Your migraine becomes severe.  You have a fever.  You have a stiff neck.  You have vision loss.  You have muscular weakness or loss of muscle control.  You start losing your balance or have trouble walking.  You feel faint or pass out.  You have severe symptoms that are different from your first symptoms. MAKE SURE YOU:   Understand these instructions.  Will  watch your condition.  Will get help right away if you are not doing well or get worse. Document Released: 08/29/2005 Document Revised: 01/13/2014 Document Reviewed: 05/06/2013 [facility] Patient Information 2015 [place], [place]. This information is not intended to replace advice given to you by your health care provider. Make sure you discuss any questions you have with your  health care provider.

## 2015-04-16 ENCOUNTER — Encounter: Payer: Self-pay | Admitting: Family Medicine

## 2015-04-16 ENCOUNTER — Other Ambulatory Visit (INDEPENDENT_AMBULATORY_CARE_PROVIDER_SITE_OTHER): Payer: 59

## 2015-04-16 ENCOUNTER — Ambulatory Visit (INDEPENDENT_AMBULATORY_CARE_PROVIDER_SITE_OTHER): Payer: 59 | Admitting: Family Medicine

## 2015-04-16 VITALS — BP 116/84 | HR 91 | Ht 66.5 in | Wt 246.0 lb

## 2015-04-16 DIAGNOSIS — M25571 Pain in right ankle and joints of right foot: Secondary | ICD-10-CM

## 2015-04-16 DIAGNOSIS — M6789 Other specified disorders of synovium and tendon, multiple sites: Secondary | ICD-10-CM

## 2015-04-16 DIAGNOSIS — M76829 Posterior tibial tendinitis, unspecified leg: Secondary | ICD-10-CM

## 2015-04-16 NOTE — Progress Notes (Signed)
Pre visit review using our clinic review tool, if applicable. No additional management support is needed unless otherwise documented below in the visit note. 

## 2015-04-16 NOTE — Patient Instructions (Addendum)
Good to see you.  Ice 20 minutes 2 times daily. Usually after activity and before bed. Exercises 3 times a week.  Continue to wear supportive shoes  Consider a heel lift from [facility] for the right foot Look at over the counter orthotics online - Spenco (total support)  Try using Pennsaid, topical anti-inflammatory, twice daily See me again in 3-4weeks    Posterior Tibial Tendon Tendinitis with Rehab Tendonitis is a condition that is characterized by inflammation of a tendon or the lining (sheath) that surrounds it. The inflammation is usually caused by damage to the tendon, such as a tendon tear (strain). Sprains are classified into three categories. Grade 1 sprains cause pain, but the tendon is not lengthened. Grade 2 sprains include a lengthened ligament due to the ligament being stretched or partially ruptured. With grade 2 sprains there is still function, although the function may be diminished. Grade 3 sprains are characterized by a complete tear of the tendon or muscle, and function is usually impaired. Posterior tibialis tendonitis is tendonitis of the posterior tibial tendon, which attaches muscles of the lower leg to the foot. The posterior tibial tendon is located in the back of the ankle and helps the body straighten (plantar flex) and rotate inward (medially rotate) the ankle. SYMPTOMS   Pain, tenderness, swelling, warmth, and/or redness over the back of the inner ankle at the posterior tibial tendon or the inner part of the mid-foot.  Pain that worsens with plantar flexion or medial rotation of the ankle.  A crackling sound (crepitation) when the tendon is moved or touched. CAUSES  Posterior tibial tendonitis occurs when damage to the posterior tibial tendon starts an inflammatory response. Common mechanisms of injury include:  Degenerative (occurs with aging) processes that weaken the tendon and make it more susceptible to injury.  Stress placed on the tendon from an increase in  the intensity, frequency, or duration of training.  Direct trauma to the ankle.  Returning to activity before a previous ankle injury is allowed to heal. RISK INCREASES WITH:  Activities that involve repetitive and/or stressful plantar flexion (jumping, kicking, or running up/down hills).  Poor strength and flexibility.  Flat feet.  Previous injury to the foot, ankle, or leg. PREVENTION   Warm up and stretch properly before activity.  Allow for adequate recovery between workouts.  Maintain physical fitness:  Strength, flexibility, and endurance.  Cardiovascular fitness.  Learn and use proper technique. When possible, have a coach correct improper technique.  Complete rehabilitation from a previous foot, ankle, or leg injury.  If you have flat feet, wear arch supports (orthotics). PROGNOSIS  If treated properly, the symptoms of tendonitis usually resolve within 6 weeks. This period may be shorter for injuries caused by direct trauma. RELATED COMPLICATIONS   Prolonged healing time, if improperly treated or reinjured.  Recurrent symptoms that result in a chronic problem.  Partial or complete tendon tear (rupture) requiring surgery. TREATMENT  Treatment initially involves the use of ice and medication to help reduce pain and inflammation. The use of strengthening and stretching exercises may help reduce pain with activity. These exercises may be performed at home or with referral to a therapist. Often times, your caregiver will recommend immobilizing the ankle to allow the tendon to heal. If you have flat feet, you may be advised to wear orthotic arch supports. If symptoms persist for greater than 6 months despite nonsurgical (conservative) treatment, then surgery may be recommended. MEDICATION   If pain medication is necessary, then  nonsteroidal anti-inflammatory medications, such as aspirin and ibuprofen, or other minor pain relievers, such as acetaminophen, are often  recommended.  Do not take pain medication for 7 days before surgery.  Prescription pain relievers may be given if deemed necessary by your caregiver. Use only as directed and only as much as you need.  Corticosteroid injections may be given by your caregiver. These injections should be reserved for the most serious cases because they may only be given a certain number of times. HEAT AND COLD  Cold treatment (icing) relieves pain and reduces inflammation. Cold treatment should be applied for 10 to 15 minutes every 2 to 3 hours for inflammation and pain and immediately after any activity that aggravates your symptoms. Use ice packs or massage the area with a piece of ice (ice massage).  Heat treatment may be used prior to performing the stretching and strengthening activities prescribed by your caregiver, physical therapist, or athletic trainer. Use a heat pack or soak the injury in warm water. SEEK MEDICAL CARE IF:  Treatment seems to offer no benefit, or the condition worsens.  Any medications produce adverse side effects. EXERCISES RANGE OF MOTION (ROM) AND STRETCHING EXERCISES - Posterior Tibial Tendon Tendinitis These exercises may help you when beginning to rehabilitate your injury. Your symptoms may resolve with or without further involvement from your physician, physical therapist or athletic trainer. While completing these exercises, remember:   Restoring tissue flexibility helps normal motion to return to the joints. This allows healthier, less painful movement and activity.  An effective stretch should be held for at least 30 seconds.  A stretch should never be painful. You should only feel a gentle lengthening or release in the stretched tissue. RANGE OF MOTION - Ankle Plantar Flexion   Sit with your right / left leg crossed over your opposite knee.  Use your opposite hand to pull the top of your foot and toes toward you.  You should feel a gentle stretch on the top of your  foot/ankle. Hold this position for __________ seconds. Repeat __________ times. Complete this exercise __________ times per day.  RANGE OF MOTION - Ankle Eversion   Sit with your right / left ankle crossed over your opposite knee.  Grip your foot with your opposite hand, placing your thumb on the top of your foot and your fingers across the bottom of your foot.  Gently push your foot downward with a slight rotation so your littlest toes rise slightly.  You should feel a gentle stretch on the inside of your ankle. Hold the stretch for __________ seconds. Repeat __________ times. Complete this exercise __________ times per day.  RANGE OF MOTION - Ankle Inversion   Sit with your right / left ankle crossed over your opposite knee.  Grip your foot with your opposite hand, placing your thumb on the bottom of your foot and your fingers across the top of your foot.  Gently pull your foot so the smallest toe comes toward you and your thumb pushes the inside of the ball of your foot away from you.  You should feel a gentle stretch on the outside of your ankle. Hold the stretch for __________ seconds. Repeat __________ times. Complete this exercise __________ times per day.  RANGE OF MOTION - Dorsi/Plantar Flexion  While sitting with your right / left knee straight, draw the top of your foot upward by flexing your ankle. Then reverse the motion, pointing your toes downward.  Hold each position for __________ seconds.  After completing your first set of exercises, repeat this exercise with your knee bent. Repeat __________ times. Complete this exercise __________ times per day.  RANGE OF MOTION - Ankle Alphabet  Imagine your right / left big toe is a pen.  Keeping your hip and knee still, write out the entire alphabet with your "pen." Make the letters as large as you can without increasing any discomfort. Repeat __________ times. Complete this exercise __________ times per day.  STRETCH -  Gastrocsoleus   Sit with your right / left leg extended. Holding onto both ends of a belt or towel, loop it around the ball of your foot.  Keeping your right / left ankle and foot relaxed and your knee straight, pull your foot and ankle toward you using the belt/towel.  You should feel a gentle stretch behind your calf or knee. Hold this position for __________ seconds. Repeat __________ times. Complete this exercise __________ times per day.  STRETCH - Gastroc, Standing   Place hands on wall.  Extend right / left leg, keeping the front knee somewhat bent.  Slightly point your toes inward on your back foot.  Keeping your right / left heel on the floor and your knee straight, shift your weight toward the wall, not allowing your back to arch.  You should feel a gentle stretch in the right / left calf. Hold this position for __________ seconds. Repeat __________ times. Complete this stretch __________ times per day. STRETCH - Soleus, Standing   Place hands on wall.  Extend right / left leg, keeping the other knee somewhat bent.  Slightly point your toes inward on your back foot.  Keep your right / left heel on the floor, bend your back knee, and slightly shift your weight over the back leg so that you feel a gentle stretch deep in your back calf.  Hold this position for __________ seconds. Repeat __________ times. Complete this stretch __________ times per day. STRENGTHENING EXERCISES - Posterior Tibial Tendon Tendinitis These exercises may help you when beginning to rehabilitate your injury. They may resolve your symptoms with or without further involvement from your physician, physical therapist, or athletic trainer. While completing these exercises, remember:   Muscles can gain both the endurance and the strength needed for everyday activities through controlled exercises.  Complete these exercises as instructed by your physician, physical therapist, or athletic trainer. Progress  the resistance and repetitions only as guided. STRENGTH - Dorsiflexors  Secure a rubber exercise band/tubing to a fixed object (i.e., table, pole) and loop the other end around your right / left foot.  Sit on the floor facing the fixed object. The band/tubing should be slightly tense when your foot is relaxed.  Slowly draw your foot back toward you using your ankle and toes.  Hold this position for __________ seconds. Slowly release the tension in the band and return your foot to the starting position. Repeat __________ times. Complete this exercise __________ times per day.  STRENGTH - Towel Curls  Sit in a chair positioned on a non-carpeted surface.  Place your foot on a towel, keeping your heel on the floor.  Pull the towel toward your heel by only curling your toes. Keep your heel on the floor.  If instructed by your physician, physical therapist, or athletic trainer, add ____________________ at the end of the towel. Repeat __________ times. Complete this exercise __________ times per day. STRENGTH - Ankle Eversion   Secure one end of a rubber exercise band/tubing to a  fixed object (table, pole). Loop the other end around your foot just before your toes.  Place your fists between your knees. This will focus your strengthening at your ankle.  Drawing the band/tubing across your opposite foot, slowly pull your little toe out and up. Make sure the band/tubing is positioned to resist the entire motion.  Hold this position for __________ seconds.  Have your muscles resist the band/tubing as it slowly pulls your foot back to the starting position. Repeat __________ times. Complete this exercise __________ times per day.  STRENGTH - Ankle Inversion   Secure one end of a rubber exercise band/tubing to a fixed object (table, pole). Loop the other end around your foot just before your toes.  Place your fists between your knees. This will focus your strengthening at your  ankle.  Slowly, pull your big toe up and in, making sure the band/tubing is positioned to resist the entire motion.  Hold this position for __________ seconds.  Have your muscles resist the band/tubing as it slowly pulls your foot back to the starting position. Repeat __________ times. Complete this exercises __________ times per day.  Document Released: 08/29/2005 Document Revised: 01/13/2014 Document Reviewed: 12/11/2008 [facility] Patient Information 2015 [place], [place]. This information is not intended to replace advice given to you by your health care provider. Make sure you discuss any questions you have with your health care provider.

## 2015-04-16 NOTE — Progress Notes (Signed)
[name] [address] [place], [place] [address] Phone: [phone] Subjective:    Referred by Gwendolyn Grant, MD  CC: Right ankle pain  [name] is a 54 y.o. female coming in with complaint of right ankle symptoms.       Past medical history, social, surgical and family history all reviewed in electronic medical record.   Review of Systems: No headache, visual changes, nausea, vomiting, diarrhea, constipation, dizziness, abdominal pain, skin rash, fevers, chills, night sweats, weight loss, swollen lymph nodes, body aches, joint swelling, muscle aches, chest pain, shortness of breath, mood changes.   Objective There were no vitals taken for this visit.  General: No apparent distress alert and oriented x3 mood and affect normal, dressed appropriately.  HEENT: Pupils equal, extraocular movements intact  Respiratory: Patient's speak in full sentences and does not appear short of breath  Cardiovascular: No lower extremity edema, non tender, no erythema  Skin: Warm dry intact with no signs of infection or rash on extremities or on axial skeleton.  Abdomen: Soft nontender  Neuro: Cranial nerves II through XII are intact, neurovascularly intact in all extremities with 2+ DTRs and 2+ pulses.  Lymph: No lymphadenopathy of posterior or anterior cervical chain or axillae bilaterally.  Gait normal with good balance and coordination.  MSK:  Non tender with full range of motion and good stability and symmetric strength and tone of shoulders, elbows, wrist, hips bilaterally.  Ankle: Right No visible erythema or swelling. Range of motion is full in all directions. Strength is 5/5 in all directions. Stable lateral and medial ligaments; squeeze test and kleiger test unremarkable; Talar dome nontender; No pain at base of 5th MT; No tenderness over cuboid; No tenderness over N spot or navicular prominence No tenderness on posterior  aspects of lateral and medial malleolus No sign of peroneal tendon subluxations or tenderness to palpation Mild discomfort around the posterior tibialis tendon. Negative tarsal tunnel tinel's Able to walk 4 steps.  Foot exam shows the patient has severe pes planus of the right foot with overpronation the hindfoot.  MSK US performed of: Right ankle This study was ordered, performed, and interpreted by Charlann Boxer D.O.  Foot/Ankle:   All structures visualized.   Talar dome unremarkable  Ankle mortise does have narrowing over the talar navicular joint as well as bone spur formation Peroneus longus and brevis tendons unremarkable on long and transverse views without sheath effusions. Posterior tibialis tendon does have hypoechoic changes but no true tear appreciated. Achilles tendon visualized along length of tendon and unremarkable on long and transverse views without sheath effusion. Anterior Talofibular Ligament and Calcaneofibular Ligaments unremarkable and intact.  Power doppler signal normal.  IMPRESSION:  Posterior tibialis tendinitis, mild with inefficiency. Underlying osteophytic changes.  Procedure note 60737; 15 minutes spent for Therapeutic exercises as stated in above notes.  This included exercises focusing on stretching, strengthening, with significant focus on eccentric aspects.  Ankle strengthening that included:  Basic range of motion exercises to allow proper full motion at ankle Stretching of the lower leg and hamstrings  Theraband exercises for the lower leg - inversion, eversion, dorsiflexion and plantarflexion each to be completed with a theraband Balance exercises to increase proprioception Weight bearing exercises to increase strength and balance Proper technique shown and discussed handout in great detail with ATC.  All questions were discussed and answered.       Impression and Recommendations:     This case required medical  decision making of moderate  complexity.

## 2015-04-16 NOTE — Assessment & Plan Note (Signed)
Patient does have some insufficiency. We discussed over-the-counter orthotics, proper shoewear, icing protocol, and patient given topical anti-implant.. We discussed a heel lift that could also allow for patient to have as much pain. We discussed if continuing have pain injection may be necessary. Patient will come back and see me again in 3-4 weeks for further evaluation and treatment.

## 2015-05-07 ENCOUNTER — Ambulatory Visit: Payer: 59 | Admitting: Family Medicine

## 2015-06-09 ENCOUNTER — Other Ambulatory Visit: Payer: Self-pay | Admitting: Internal Medicine

## 2015-06-16 ENCOUNTER — Telehealth: Payer: Self-pay | Admitting: Internal Medicine

## 2015-06-16 MED ORDER — LOSARTAN POTASSIUM 25 MG PO TABS: 50.0000 mg | ORAL_TABLET | Freq: Every day | ORAL | Status: DC

## 2015-06-16 MED ORDER — HYDROCHLOROTHIAZIDE 25 MG PO TABS: 25.0000 mg | ORAL_TABLET | Freq: Every day | ORAL | Status: DC

## 2015-06-16 NOTE — Telephone Encounter (Signed)
30 day sent to pharmacy.  

## 2015-06-16 NOTE — Telephone Encounter (Signed)
Pt called in and had to make an appt to get meds refilled for Monday.  She has not been seen since 2015.  She is out of BP meds and wants to know if a few can be called in til she can make it til Monday?

## 2015-06-16 NOTE — Telephone Encounter (Signed)
Pt informed

## 2015-06-22 ENCOUNTER — Ambulatory Visit (INDEPENDENT_AMBULATORY_CARE_PROVIDER_SITE_OTHER): Payer: 59 | Admitting: Internal Medicine

## 2015-06-22 ENCOUNTER — Other Ambulatory Visit (INDEPENDENT_AMBULATORY_CARE_PROVIDER_SITE_OTHER): Payer: 59

## 2015-06-22 ENCOUNTER — Encounter: Payer: Self-pay | Admitting: Internal Medicine

## 2015-06-22 VITALS — BP 118/78 | HR 86 | Temp 98.6°F | Resp 14 | Ht 66.5 in | Wt 249.0 lb

## 2015-06-22 DIAGNOSIS — F411 Generalized anxiety disorder: Secondary | ICD-10-CM

## 2015-06-22 DIAGNOSIS — I10 Essential (primary) hypertension: Secondary | ICD-10-CM | POA: Diagnosis not present

## 2015-06-22 DIAGNOSIS — R739 Hyperglycemia, unspecified: Secondary | ICD-10-CM | POA: Insufficient documentation

## 2015-06-22 LAB — BASIC METABOLIC PANEL
BUN: 8 mg/dL (ref 6–23)
CALCIUM: 9.5 mg/dL (ref 8.4–10.5)
CO2: 29 mEq/L (ref 19–32)
Chloride: 97 mEq/L (ref 96–112)
Creatinine, Ser: 0.74 mg/dL (ref 0.40–1.20)
GFR: 105.21 mL/min (ref >60.00)
Glucose, Bld: 96 mg/dL (ref 70–99)
POTASSIUM: 3.3 meq/L — AB (ref 3.5–5.1)
Sodium: 136 mEq/L (ref 135–145)

## 2015-06-22 LAB — HEMOGLOBIN A1C: HEMOGLOBIN A1C: 5.7 % (ref 4.6–6.5)

## 2015-06-22 MED ORDER — ALPRAZOLAM 0.25 MG PO TABS: 0.2500 mg | ORAL_TABLET | Freq: Every evening | ORAL | Status: DC | PRN

## 2015-06-22 MED ORDER — HYDROCHLOROTHIAZIDE 25 MG PO TABS: 25.0000 mg | ORAL_TABLET | Freq: Every day | ORAL | Status: DC

## 2015-06-22 MED ORDER — LOSARTAN POTASSIUM 25 MG PO TABS: 50.0000 mg | ORAL_TABLET | Freq: Every day | ORAL | Status: DC

## 2015-06-22 NOTE — Assessment & Plan Note (Signed)
Renew Xanax Risks discussed

## 2015-06-22 NOTE — Patient Instructions (Signed)
Minimal Blood Pressure Goal= AVERAGE < 140/90;  Ideal is an AVERAGE < 135/85. This AVERAGE should be calculated from @ least 5-7 BP readings taken @ different times of day on different days of week. You should not respond to isolated BP readings , but rather the AVERAGE for that week .Please bring your  blood pressure cuff to office visits to verify that it is reliable.It  can also be checked against the blood pressure device at the pharmacy. Finger or wrist cuffs are not dependable; an arm cuff is. Your next office appointment will be determined based upon review of your pending labs. Those written interpretation of the lab results and instructions will be transmitted to you by mail for your records.  Critical results will be called.   Followup as needed for any active or acute issue. Please report any significant change in your symptoms. 

## 2015-06-22 NOTE — Assessment & Plan Note (Signed)
Blood pressure goals reviewed. BMET 

## 2015-06-22 NOTE — Assessment & Plan Note (Signed)
A1c

## 2015-06-22 NOTE — Progress Notes (Signed)
Pre visit review using our clinic review tool, if applicable. No additional management support is needed unless otherwise documented below in the visit note. 

## 2015-06-22 NOTE — Progress Notes (Signed)
   Subjective:    Patient ID: [name], female    DOB: December 08, 1960, 54 y.o.   MRN: [id number]  HPI The patient is here to assess status of active health conditions.  PMH, FH, & Social History reviewed & updated.No change in [place] as recorded.  She's been compliant with her medicines without adverse effects. She is on a modified heart healthy diet. She does walk 30 minutes 5 times a week without associated cardiopulmonary symptoms. She is somewhat limited by hip pain which begins 30 minutes after starting walking. She relates this to having worn a brace following total knee replacement.  Blood pressure averages 110/80 at home.  She does not smoke. She'll have 1 glass of white wine a week.  She rarely takes the tryptan for migraines. She also rarely takes alprazolam.  Colonoscopy is up-to-date; it was completed November 2015. It was to be repeated 2020 because of polypoid mucosal changes.  Her maternal grandmother had diabetes. The patient's last glucose on record was 100 in April 2014. There is no family history heart attack or stroke.    Review of Systems  Comprehensive review is negative except for some heat intolerance.  Chest pain, palpitations, tachycardia, exertional dyspnea, paroxysmal nocturnal dyspnea, claudication or edema are absent. No unexplained weight loss, abdominal pain, significant dyspepsia, dysphagia, melena, rectal bleeding, or persistently small caliber stools. Dysuria, pyuria, hematuria, frequency, nocturia or polyuria are denied. Change in hair, skin, nails denied. No bowel changes of constipation or diarrhea. No intolerance to cold.     Objective:   Physical Exam  Pertinent or positive findings include: There is crepitus of the knees greater on the left than right. Knee reflexes are 0-1/2+.  General appearance :adequately nourished; in no distress. BMI 39.59.  Eyes: No conjunctival inflammation or scleral icterus is present.  Oral exam:  Lips  and gums are healthy appearing.There is no oropharyngeal erythema or exudate noted. Dental hygiene is good.  Heart:  Normal rate and regular rhythm. S1 and S2 normal without gallop, murmur, click, rub or other extra sounds    Lungs:Chest clear to auscultation; no wheezes, rhonchi,rales ,or rubs present.No increased work of breathing.   Abdomen: bowel sounds normal, soft and non-tender without masses, organomegaly or hernias noted.  No guarding or rebound. No flank tenderness to percussion.  Vascular : all pulses equal ; no bruits present.  Skin:Warm & dry.  Intact without suspicious lesions or rashes ; no tenting    Lymphatic: No lymphadenopathy is noted about the head, neck, axilla.   Neuro: Strength, tone normal.         Assessment & Plan:  See Current Assessment & Plan in Problem List under specific Diagnosis

## 2015-06-24 ENCOUNTER — Telehealth: Payer: Self-pay

## 2015-06-24 DIAGNOSIS — E876 Hypokalemia: Secondary | ICD-10-CM

## 2015-06-24 NOTE — Telephone Encounter (Signed)
Pt informed to have repeat bmet in  6-8 weeks. Labs entered per MD.

## 2015-07-15 ENCOUNTER — Other Ambulatory Visit (INDEPENDENT_AMBULATORY_CARE_PROVIDER_SITE_OTHER): Payer: 59

## 2015-07-15 ENCOUNTER — Encounter: Payer: Self-pay | Admitting: Internal Medicine

## 2015-07-15 ENCOUNTER — Ambulatory Visit (INDEPENDENT_AMBULATORY_CARE_PROVIDER_SITE_OTHER): Payer: 59 | Admitting: Internal Medicine

## 2015-07-15 VITALS — BP 140/90 | HR 110 | Wt 247.0 lb

## 2015-07-15 DIAGNOSIS — R0600 Dyspnea, unspecified: Secondary | ICD-10-CM

## 2015-07-15 DIAGNOSIS — R5383 Other fatigue: Secondary | ICD-10-CM

## 2015-07-15 DIAGNOSIS — I471 Supraventricular tachycardia: Secondary | ICD-10-CM | POA: Diagnosis not present

## 2015-07-15 DIAGNOSIS — R0609 Other forms of dyspnea: Secondary | ICD-10-CM | POA: Diagnosis not present

## 2015-07-15 DIAGNOSIS — R000 Tachycardia, unspecified: Secondary | ICD-10-CM

## 2015-07-15 DIAGNOSIS — I10 Essential (primary) hypertension: Secondary | ICD-10-CM

## 2015-07-15 LAB — CBC WITH DIFFERENTIAL/PLATELET
BASOS PCT: 0.6 % (ref 0.0–3.0)
Basophils Absolute: 0.1 10*3/uL (ref 0.0–0.1)
EOS ABS: 0.1 10*3/uL (ref 0.0–0.7)
Eosinophils Relative: 0.6 % (ref 0.0–5.0)
HCT: 41.5 % (ref 36.0–46.0)
HEMOGLOBIN: 13.4 g/dL (ref 12.0–15.0)
LYMPHS ABS: 4 10*3/uL (ref 0.7–4.0)
Lymphocytes Relative: 47.4 % — ABNORMAL HIGH (ref 12.0–46.0)
MCHC: 32.4 g/dL (ref 30.0–36.0)
MCV: 88.3 fl (ref 78.0–100.0)
Monocytes Absolute: 1.1 10*3/uL — ABNORMAL HIGH (ref 0.1–1.0)
Monocytes Relative: 12.5 % — ABNORMAL HIGH (ref 3.0–12.0)
NEUTROS PCT: 38.9 % — AB (ref 43.0–77.0)
Neutro Abs: 3.3 10*3/uL (ref 1.4–7.7)
Platelets: 300 10*3/uL (ref 150.0–400.0)
RBC: 4.7 Mil/uL (ref 3.87–5.11)
RDW: 15.9 % — AB (ref 11.5–15.5)
WBC: 8.4 10*3/uL (ref 4.0–10.5)

## 2015-07-15 LAB — BASIC METABOLIC PANEL
BUN: 17 mg/dL (ref 6–23)
CO2: 34 mEq/L — ABNORMAL HIGH (ref 19–32)
Calcium: 9.6 mg/dL (ref 8.4–10.5)
Chloride: 92 mEq/L — ABNORMAL LOW (ref 96–112)
Creatinine, Ser: 0.76 mg/dL (ref 0.40–1.20)
GFR: 102 mL/min (ref >60.00)
Glucose, Bld: 101 mg/dL — ABNORMAL HIGH (ref 70–99)
Potassium: 3.3 mEq/L — ABNORMAL LOW (ref 3.5–5.1)
Sodium: 136 mEq/L (ref 135–145)

## 2015-07-15 LAB — TSH: TSH: 0.58 u[IU]/mL (ref 0.35–4.50)

## 2015-07-15 LAB — T4, FREE: Free T4: 0.93 ng/dL (ref 0.60–1.60)

## 2015-07-15 LAB — HEPATIC FUNCTION PANEL
ALT: 36 U/L — AB (ref 0–35)
AST: 29 U/L (ref 0–37)
Albumin: 4.2 g/dL (ref 3.5–5.2)
Alkaline Phosphatase: 61 U/L (ref 39–117)
BILIRUBIN DIRECT: 0.1 mg/dL (ref 0.0–0.3)
BILIRUBIN TOTAL: 0.4 mg/dL (ref 0.2–1.2)
Total Protein: 8.1 g/dL (ref 6.0–8.3)

## 2015-07-15 LAB — T3, FREE: T3 FREE: 3.5 pg/mL (ref 2.3–4.2)

## 2015-07-15 LAB — MAGNESIUM: MAGNESIUM: 2 mg/dL (ref 1.5–2.5)

## 2015-07-15 NOTE — Patient Instructions (Addendum)
To prevent rapid heart beats, avoid stimulants such as decongestants, diet pills, nicotine, or caffeine (coffee, tea, cola, or chocolate) to excess.  Minimal Blood Pressure Goal= AVERAGE < 140/90;  Ideal is an AVERAGE < 135/85. This AVERAGE should be calculated from @ least 5-7 BP readings taken @ different times of day on different days of week. You should not respond to isolated BP readings , but rather the AVERAGE for that week .Please bring your  blood pressure cuff to office visits to verify that it is reliable.It  can also be checked against the blood pressure device at the pharmacy. Finger or wrist cuffs are not dependable; an arm cuff is.   Your next office appointment will be determined based upon review of your pending labs .  Those written interpretation of the lab results and instructions will be transmitted to you by mail for your records.  Critical results will be called.   Followup as needed for any active or acute issue. Please report any significant change in your symptoms.

## 2015-07-15 NOTE — Progress Notes (Signed)
Pre visit review using our clinic review tool, if applicable. No additional management support is needed unless otherwise documented below in the visit note. 

## 2015-07-15 NOTE — Progress Notes (Signed)
   Subjective:    Patient ID: [name], female    DOB: [birth year], 54 y.o.   MRN: [id number]  HPI She is here requesting a change in her blood pressure medicine. She's been on low-dose losartan for 2 years at least. She questions whether it is causing fatigue. Originally she attributed the fatigue to wearing a leg brace and employing crutches.She's been able to discontinue those over a year ago yet the fatigue persists.  She also has some exertional dyspnea after walking half a mile.  She questions that the blood pressure medicines is also causing her to urinate frequently although she's not on a diuretic.  She has been compliant with her medicines. She is not checking her blood pressure. She is unable to exercise regularly because of fatigue. She states she's on a heart healthy diet with no added salt diet.  Family history is positive for hypertension & diabetes .There is no family history of heart attack or stroke.  She has never had an EKG.   Review of Systems  She is intolerant to heat. Chest pain, palpitations, tachycardia, paroxysmal nocturnal dyspnea, claudication or edema are absent. No unexplained weight loss, abdominal pain, significant dyspepsia, dysphagia, melena, rectal bleeding, or persistently small caliber stools. Dysuria, pyuria, hematuria,  nocturia or polyuria are denied. Change in hair, skin, nails denied. No bowel changes of constipation or diarrhea. No intolerance to  cold.     Objective:   Physical Exam  Pertinent or positive findings include:  resting tachycardia of 115. Breath sounds are decreased. She has crepitus in the knees ,greater on the left. There is a well-healed operative scar on the left knee.  General appearance :adequately nourished; in no distress.  Eyes: No conjunctival inflammation or scleral icterus is present.  Oral exam:  Lips and gums are healthy appearing.There is no oropharyngeal erythema or exudate noted. Dental  hygiene is good.  Heart:   regular rhythm. S1 and S2 normal without gallop, murmur, click, rub or other extra sounds    Lungs:Chest clear to auscultation; no wheezes, rhonchi,rales ,or rubs present.No increased work of breathing.   Abdomen: bowel sounds normal, soft and non-tender without masses, organomegaly or hernias noted.  No guarding or rebound.   Vascular : all pulses equal ; no bruits present.  Skin:Warm & dry.  Intact without suspicious lesions or rashes ; no tenting    Lymphatic: No lymphadenopathy is noted about the head, neck, axilla.   Neuro: Strength, tone & DTRs normal.      Assessment & Plan:  #1 hypertension; she feels that the ARB may be causing fatigue  #2 fatigue present at least 2 years  #3 exertional dyspnea  #4 tachycardia;; EKG reveals a sinus tachycardia with a rate of 102. No ischemic changes are present.  Plan: See orders and recommendations

## 2015-07-17 ENCOUNTER — Other Ambulatory Visit: Payer: Self-pay | Admitting: Internal Medicine

## 2015-07-17 DIAGNOSIS — I10 Essential (primary) hypertension: Secondary | ICD-10-CM

## 2015-07-17 DIAGNOSIS — R0600 Dyspnea, unspecified: Secondary | ICD-10-CM

## 2015-07-17 DIAGNOSIS — R000 Tachycardia, unspecified: Secondary | ICD-10-CM

## 2015-07-17 DIAGNOSIS — R5382 Chronic fatigue, unspecified: Secondary | ICD-10-CM

## 2015-07-17 DIAGNOSIS — R0609 Other forms of dyspnea: Secondary | ICD-10-CM

## 2015-07-17 DIAGNOSIS — E876 Hypokalemia: Secondary | ICD-10-CM

## 2015-07-17 MED ORDER — LOSARTAN POTASSIUM 100 MG PO TABS: 100.0000 mg | ORAL_TABLET | Freq: Every day | ORAL | Status: DC

## 2015-07-17 MED ORDER — LOSARTAN POTASSIUM 25 MG PO TABS: 50.0000 mg | ORAL_TABLET | Freq: Every day | ORAL | Status: DC

## 2015-08-11 NOTE — Progress Notes (Signed)
This encounter was created in error - please disregard.

## 2015-08-12 ENCOUNTER — Encounter: Payer: 59 | Admitting: Cardiovascular Disease

## 2015-09-09 NOTE — Progress Notes (Signed)
Cardiology Office Note   Date:  09/10/2015   ID:  [name], DOB 07-04-1961, MRN [id number]  PCP:  Gwendolyn Grant, MD  Cardiologist:   Sharol Harness, MD   Chief Complaint  Patient presents with  . New Evaluation  . Shortness of Breath    WHEN WALKING FOR A LONG TIME.    History of Present Illness: [name] is a 54 y.o. female with hypertension and anxiety who presents for evaluation of sinus tachycardia.  [name] was seen by Dr. Linna Darner on 11/2, at which time she was noted to be tachycardic to 102 bpm.  At that appointment she also complained of fatigue and frequent urination, which she related to her losartan.  She was referred to cardiology for evaluation of her tachycardia.  On review of her vital signs at clinic appointments, her heart rate runs between 76-113 bpm.  Thyroid function was normal. Basic metabolic panel revealed a potassium of 3.3.  Hydrochlorothiazide was lowered and losartan was increased.  [name] reports fatigue.  She tore her L MCL/ACL 2 years ago.  She does not get much exercise due to pain in her back and knee when she walks.  She has gained 50 lb since she injured her knee.  She has followed up with Orthopedics but only stretches and knee strengthening exercises were recommended.  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  She denies any recent car/plane rides or calf pain.  She drinks one cup of coffee daily and occasionally drinks soda. She mostly drinks water.  She denies lower extremity edema, orthopnea, or PND. Ms. Urso struggles with sinus congestion. She uses Sudafed at least 3 times per week and Mucinex D at least twice per week.  She also has anxiety and gets chest pressure when she has an anxiety attack. This happens twice per week and not with exertion.   It improves with deep breathing and lasts for a few minutes at a time.   Past Medical History  Diagnosis Date  . Anxiety state, unspecified   . DEPRESSION   . Hypertension   . Tear of MCL (medial collateral ligament) of knee 11/2012    left knee  . Sinus tachycardia ([place]) 09/10/2015    Past Surgical History  Procedure Laterality Date  . Dilatation & currettage/hysteroscopy with resectocope  08-11-2004    removal polyps  . Hammer toe surgery Bilateral 03-22-2006    fifth toe  . Knee arthroscopy with medial collateral ligament reconstruction Left 12/18/2012    Procedure: KNEE ARTHROSCOPY WITH OPEN MEDIAL COLLATERAL LIGAMENT RECONSTRUCTION WITH ALLOGRAFT,ANTERIOR CRUCIATE LIGAMENT RESCONSTRUCTION WITH ALLOGRAFT, PARTIAL MEDIALMENISECTOMY/DEBRIDEMENT CHONDROPLASTY;  Surgeon: Sydnee Cabal, MD;  Location: [facility];  Service: Orthopedics;  Laterality: Left;  . Colonoscopy  2015     Current Outpatient Prescriptions  Medication Sig Dispense Refill  . ALPRAZolam (XANAX) 0.25 MG tablet Take 1 tablet (0.25 mg total) by mouth at bedtime as needed for anxiety. 30 tablet 0  . aspirin 81 MG chewable tablet Chew 81 mg by mouth daily.    . hydrochlorothiazide (HYDRODIURIL) 25 MG tablet 1/2 qd 90 tablet 1  . losartan (COZAAR) 100 MG tablet Take 1 tablet (100 mg total) by mouth daily. 90 tablet 3  . SUMAtriptan (IMITREX) 50 MG tablet Take 1 tablet (50 mg total) by mouth  every 2 (two) hours as needed for migraine or headache. May repeat in 2 hours if headache persists or recurs. 10 tablet 0   No current facility-administered medications for this visit.    Allergies:   Oxycodone-acetaminophen    Social History:  The patient  reports that she quit smoking about 14 years ago. Her smoking use included Cigarettes. She quit after 6 years of use. She has never used smokeless tobacco. She reports that she drinks alcohol. She reports that she does not use illicit drugs.   Family History:   The patient's family history includes Cancer in her mother; Diabetes in her maternal grandmother; Hypertension in her father and mother. There is no history of Colon cancer, Rectal cancer, or Stomach cancer.    ROS:  Please see the history of present illness.   Otherwise, review of systems are positive for none.   All other systems are reviewed and negative.    PHYSICAL EXAM: VS:  BP 120/80 mmHg  Pulse 110  Ht 5\' 7"  (1.702 m)  Wt 112.946 kg (249 lb)  BMI 38.99 kg/m2 , BMI Body mass index is 38.99 kg/(m^2). GENERAL:  Well appearing.   HEENT:  Pupils equal round and reactive, fundi not visualized, oral mucosa unremarkable NECK:  No jugular venous distention, waveform within normal limits, carotid upstroke brisk and symmetric, no bruits, no thyromegaly LYMPHATICS:  No cervical adenopathy LUNGS:  Clear to auscultation bilaterally HEART:  RRR.  PMI not displaced or sustained,S1 and S2 within normal limits, no S3, no S4, no clicks, no rubs, no murmurs ABD:  Flat, positive bowel sounds normal in frequency in pitch, no bruits, no rebound, no guarding, no midline pulsatile mass, no hepatomegaly, no splenomegaly EXT:  2 plus pulses throughout, no edema, no cyanosis no clubbing SKIN:  No rashes no nodules NEURO:  Cranial nerves II through XII grossly intact, motor grossly intact throughout PSYCH:  Cognitively intact, oriented to person place and time    EKG:  EKG is ordered today. The ekg ordered today demonstrates sinus tachycardia rate 110 bpm.  Early r wave progression.     Recent Labs: 07/15/2015: ALT 36*; BUN 17; Creatinine, Ser 0.76; Hemoglobin 13.4; Magnesium 2.0; Platelets 300.0; Potassium 3.3*; Sodium 136; TSH 0.58    Lipid Panel    Component Value Date/Time   CHOL 173 10/26/2011 0902   TRIG 61.0 10/26/2011 0902   HDL 85.90 10/26/2011 0902   CHOLHDL 2 10/26/2011 0902   VLDL 12.2 10/26/2011 0902   LDLCALC 75 10/26/2011 0902      Wt Readings from Last 3 Encounters:  09/10/15  112.946 kg (249 lb)  07/15/15 112.038 kg (247 lb)  06/22/15 112.946 kg (249 lb)      ASSESSMENT AND PLAN:  # Tachycardia: Asymptomatic sinus tachycardia.  It seems most likely that this due Sudafed and Mucinex D use. She also suffers from anxiety.  There is no evidence of heart failure or infection on exam.  Her thyroid function s normal and she is not anemic. She seems to be at low risk for PE and has no calf pain on exam.  For now she will stop using these over the counter medications.  We will btain an echo to evaluate both her shortness of breath and to ensure that there is no structural heart disease.  # Obesity:  We discussed the importance of regular exercise. She is limited by back and knee pain. I suggested water aerobics and she was amenable to this option.  #  Hypertension: Blood pressure well-controlled on losartan and hydrochlorothiazide.   Current medicines are reviewed at length with the patient today.  The patient does not have concerns regarding medicines.  The following changes have been made:  Stop sudafed and Mucinex D  Labs/ tests ordered today include:   Orders Placed This Encounter  Procedures  . EKG 12-Lead  . ECHOCARDIOGRAM COMPLETE     Disposition:   FU with Chrstopher Malenfant C. Oval Linsey, MD, [facility] in 2 months.    Signed, Gordon Carlson C. Oval Linsey, MD, [facility]  09/10/2015 1:06 PM    [facility]

## 2015-09-10 ENCOUNTER — Encounter: Payer: Self-pay | Admitting: Cardiovascular Disease

## 2015-09-10 ENCOUNTER — Ambulatory Visit (INDEPENDENT_AMBULATORY_CARE_PROVIDER_SITE_OTHER): Payer: 59 | Admitting: Cardiovascular Disease

## 2015-09-10 VITALS — BP 120/80 | HR 110 | Ht 67.0 in | Wt 249.0 lb

## 2015-09-10 DIAGNOSIS — R0602 Shortness of breath: Secondary | ICD-10-CM

## 2015-09-10 DIAGNOSIS — I10 Essential (primary) hypertension: Secondary | ICD-10-CM

## 2015-09-10 DIAGNOSIS — R000 Tachycardia, unspecified: Secondary | ICD-10-CM | POA: Diagnosis not present

## 2015-09-10 HISTORY — DX: Tachycardia, unspecified: R00.0

## 2015-09-10 NOTE — Patient Instructions (Signed)
Medication Instructions:  STOP TAKING MUCINEX D AND SUDAFED  Labwork: NONE  Testing/Procedures: 1. 2D Echocardiogram - Your physician has requested that you have an echocardiogram. Echocardiography is a painless test that uses sound waves to create images of your heart. It provides your doctor with information about the size and shape of your heart and how well your heart's chambers and valves are working. This procedure takes approximately one hour. There are no restrictions for this procedure.  Follow-Up: Dr Oval Linsey recommends that you schedule a follow-up appointment in 2 months.  If you need a refill on your cardiac medications before your next appointment, please call your pharmacy.

## 2015-09-18 MED FILL — HYDROCHLOROTHIAZIDE 25 MG T: 25 | 90 days supply | Qty: 90 | Fill #0

## 2015-09-21 ENCOUNTER — Other Ambulatory Visit (HOSPITAL_COMMUNITY): Payer: 59

## 2015-10-22 MED FILL — LOSARTAN POTASSIUM 100 MG T: 100 | 90 days supply | Qty: 90 | Fill #1

## 2015-11-09 NOTE — Progress Notes (Deleted)
Cardiology Office Note   Date:  11/09/2015   ID:  [name], DOB [birth year], MRN [id number]  PCP:  Gwendolyn Grant, MD  Cardiologist:   Sharol Harness, MD   No chief complaint on file.   Patient ID: [name] is a 55 y.o. female with hypertension and anxiety who presents for evaluation of sinus tachycardia.    Interval History 11/10/15: After her last appointment [name] was asked to stop taking Sudafed and Mucinex D.     exercise  History of Present Illness 09/10/15: [name] was seen by [name] on 11/2, at which time she was noted to be tachycardic to 102 bpm.  At that appointment she also complained of fatigue and frequent urination, which she related to her losartan.  She was referred to cardiology for evaluation of her tachycardia.  On review of her vital signs at clinic appointments, her heart rate runs between 76-113 bpm.  Thyroid function was normal. Basic metabolic panel revealed a potassium of 3.3.  Hydrochlorothiazide was lowered and losartan was increased.  [name] reports fatigue.  She tore her L MCL/ACL 2 years ago.  She does not get much exercise due to pain in her back and knee when she walks.  She has gained 50 lb since she injured her knee.  She has followed up with Orthopedics but only stretches and knee strengthening exercises were recommended.  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  She denies any recent car/plane rides or calf pain.  She drinks one cup of coffee daily and occasionally drinks soda. She mostly drinks water.  She denies lower extremity edema, orthopnea, or PND. [name] struggles with sinus congestion. She uses Sudafed at least 3 times per week and Mucinex D at least twice per week.  She also has anxiety and gets chest pressure when she  has an anxiety attack. This happens twice per week and not with exertion.  It improves with deep breathing and lasts for a few minutes at a time.   Past Medical History  Diagnosis Date  . Anxiety state, unspecified   . DEPRESSION   . Hypertension   . Tear of MCL (medial collateral ligament) of knee 11/2012    left knee  . Sinus tachycardia ([place]) 09/10/2015    Past Surgical History  Procedure Laterality Date  . Dilatation & currettage/hysteroscopy with resectocope  08-11-2004    removal polyps  . Hammer toe surgery Bilateral 03-22-2006    fifth toe  . Knee arthroscopy with medial collateral ligament reconstruction Left 12/18/2012    Procedure: KNEE ARTHROSCOPY WITH OPEN MEDIAL COLLATERAL LIGAMENT RECONSTRUCTION WITH ALLOGRAFT,ANTERIOR CRUCIATE LIGAMENT RESCONSTRUCTION WITH ALLOGRAFT, PARTIAL MEDIALMENISECTOMY/DEBRIDEMENT CHONDROPLASTY;  Surgeon: Sydnee Cabal, MD;  Location: [facility];  Service: Orthopedics;  Laterality: Left;  . Colonoscopy  2015     Current Outpatient Prescriptions  Medication Sig Dispense Refill  . ALPRAZolam (XANAX) 0.25 MG tablet Take 1 tablet (0.25 mg total) by mouth at bedtime as needed for anxiety. 30 tablet 0  . aspirin 81 MG chewable tablet Chew 81 mg by mouth daily.    . hydrochlorothiazide (HYDRODIURIL) 25 MG tablet 1/2 qd 90 tablet 1  . losartan (COZAAR) 100 MG tablet Take 1 tablet (100 mg total) by mouth daily. 90 tablet 3  . SUMAtriptan (IMITREX) 50 MG  tablet Take 1 tablet (50 mg total) by mouth every 2 (two) hours as needed for migraine or headache. May repeat in 2 hours if headache persists or recurs. 10 tablet 0   No current facility-administered medications for this visit.    Allergies:   Oxycodone-acetaminophen    Social History:  The patient  reports that she quit smoking about 14 years ago. Her smoking use included Cigarettes. She quit after 6 years of use. She has never used smokeless tobacco. She reports that she drinks  alcohol. She reports that she does not use illicit drugs.   Family History:  The patient's family history includes Cancer in her mother; Diabetes in her maternal grandmother; Hypertension in her father and mother. There is no history of Colon cancer, Rectal cancer, or Stomach cancer.    ROS:  Please see the history of present illness.   Otherwise, review of systems are positive for none.   All other systems are reviewed and negative.    PHYSICAL EXAM: VS:  There were no vitals taken for this visit. , BMI There is no weight on file to calculate BMI. GENERAL:  Well appearing.   HEENT:  Pupils equal round and reactive, fundi not visualized, oral mucosa unremarkable NECK:  No jugular venous distention, waveform within normal limits, carotid upstroke brisk and symmetric, no bruits, no thyromegaly LYMPHATICS:  No cervical adenopathy LUNGS:  Clear to auscultation bilaterally HEART:  RRR.  PMI not displaced or sustained,S1 and S2 within normal limits, no S3, no S4, no clicks, no rubs, no murmurs ABD:  Flat, positive bowel sounds normal in frequency in pitch, no bruits, no rebound, no guarding, no midline pulsatile mass, no hepatomegaly, no splenomegaly EXT:  2 plus pulses throughout, no edema, no cyanosis no clubbing SKIN:  No rashes no nodules NEURO:  Cranial nerves II through XII grossly intact, motor grossly intact throughout PSYCH:  Cognitively intact, oriented to person place and time    EKG:  EKG is ordered today. The ekg ordered today demonstrates sinus tachycardia rate 110 bpm.  Early r wave progression.     Recent Labs: 07/15/2015: ALT 36*; BUN 17; Creatinine, Ser 0.76; Hemoglobin 13.4; Magnesium 2.0; Platelets 300.0; Potassium 3.3*; Sodium 136; TSH 0.58    Lipid Panel    Component Value Date/Time   CHOL 173 10/26/2011 0902   TRIG 61.0 10/26/2011 0902   HDL 85.90 10/26/2011 0902   CHOLHDL 2 10/26/2011 0902   VLDL 12.2 10/26/2011 0902   LDLCALC 75 10/26/2011 0902      Wt  Readings from Last 3 Encounters:  09/10/15 112.946 kg (249 lb)  07/15/15 112.038 kg (247 lb)  06/22/15 112.946 kg (249 lb)      ASSESSMENT AND PLAN:  # Tachycardia: Asymptomatic sinus tachycardia.  It seems most likely that this due Sudafed and Mucinex D use. She also suffers from anxiety.  There is no evidence of heart failure or infection on exam.  Her thyroid function s normal and she is not anemic. She seems to be at low risk for PE and has no calf pain on exam.  For now she will stop using these over the counter medications.  We will btain an echo to evaluate both her shortness of breath and to ensure that there is no structural heart disease.  # Obesity:  We discussed the importance of regular exercise. She is limited by back and knee pain. I suggested water aerobics and she was amenable to this option.  # Hypertension: Blood  pressure well-controlled on losartan and hydrochlorothiazide.   Current medicines are reviewed at length with the patient today.  The patient does not have concerns regarding medicines.  The following changes have been made:  Stop sudafed and Mucinex D  Labs/ tests ordered today include:   No orders of the defined types were placed in this encounter.     Disposition:   FU with Billee Balcerzak C. Oval Linsey, MD, [facility] in 2 months.    Signed, Yaniah Thiemann C. Oval Linsey, MD, [facility]  11/09/2015 11:20 PM    [place]

## 2015-11-10 ENCOUNTER — Encounter: Payer: 59 | Admitting: Cardiovascular Disease

## 2015-11-10 ENCOUNTER — Encounter: Payer: Self-pay | Admitting: *Deleted

## 2015-11-26 DIAGNOSIS — M71571 Other bursitis, not elsewhere classified, right ankle and foot: Secondary | ICD-10-CM | POA: Diagnosis not present

## 2015-11-26 DIAGNOSIS — M19071 Primary osteoarthritis, right ankle and foot: Secondary | ICD-10-CM | POA: Diagnosis not present

## 2015-11-26 DIAGNOSIS — M76821 Posterior tibial tendinitis, right leg: Secondary | ICD-10-CM | POA: Diagnosis not present

## 2015-11-28 NOTE — Progress Notes (Signed)
This encounter was created in error - please disregard.

## 2015-11-30 DIAGNOSIS — M76821 Posterior tibial tendinitis, right leg: Secondary | ICD-10-CM | POA: Diagnosis not present

## 2015-11-30 DIAGNOSIS — M71571 Other bursitis, not elsewhere classified, right ankle and foot: Secondary | ICD-10-CM | POA: Diagnosis not present

## 2015-12-07 DIAGNOSIS — M76821 Posterior tibial tendinitis, right leg: Secondary | ICD-10-CM | POA: Diagnosis not present

## 2015-12-07 DIAGNOSIS — M76822 Posterior tibial tendinitis, left leg: Secondary | ICD-10-CM | POA: Diagnosis not present

## 2015-12-24 MED FILL — HYDROCHLOROTHIAZIDE 25 MG T: 25 | 90 days supply | Qty: 90 | Fill #1

## 2016-01-26 MED FILL — LOSARTAN POTASSIUM 100 MG T: 100 | 90 days supply | Qty: 90 | Fill #2

## 2016-02-03 DIAGNOSIS — L700 Acne vulgaris: Secondary | ICD-10-CM | POA: Diagnosis not present

## 2016-02-03 MED FILL — CLINDAMYCIN PHOSP 1% LOTION: 1 | 30 days supply | Qty: 60 | Fill #0

## 2016-03-30 MED FILL — HYDROCHLOROTHIAZIDE 25 MG T: 25 | 30 days supply | Qty: 30 | Fill #0

## 2016-05-03 ENCOUNTER — Other Ambulatory Visit: Payer: Self-pay | Admitting: Internal Medicine

## 2016-05-03 MED FILL — LOSARTAN POTASSIUM 100 MG T: 100 | 90 days supply | Qty: 90 | Fill #3

## 2016-05-18 ENCOUNTER — Telehealth: Payer: Self-pay | Admitting: Nurse Practitioner

## 2016-05-18 ENCOUNTER — Encounter: Payer: Self-pay | Admitting: Nurse Practitioner

## 2016-05-18 ENCOUNTER — Ambulatory Visit (INDEPENDENT_AMBULATORY_CARE_PROVIDER_SITE_OTHER): Payer: 59 | Admitting: Nurse Practitioner

## 2016-05-18 ENCOUNTER — Other Ambulatory Visit (INDEPENDENT_AMBULATORY_CARE_PROVIDER_SITE_OTHER): Payer: 59

## 2016-05-18 VITALS — BP 108/74 | HR 115 | Temp 98.5°F | Ht 67.0 in | Wt 242.0 lb

## 2016-05-18 DIAGNOSIS — R51 Headache: Secondary | ICD-10-CM | POA: Diagnosis not present

## 2016-05-18 DIAGNOSIS — I10 Essential (primary) hypertension: Secondary | ICD-10-CM

## 2016-05-18 DIAGNOSIS — F411 Generalized anxiety disorder: Secondary | ICD-10-CM

## 2016-05-18 DIAGNOSIS — Z0000 Encounter for general adult medical examination without abnormal findings: Secondary | ICD-10-CM

## 2016-05-18 DIAGNOSIS — R6889 Other general symptoms and signs: Secondary | ICD-10-CM | POA: Diagnosis not present

## 2016-05-18 DIAGNOSIS — Z0001 Encounter for general adult medical examination with abnormal findings: Secondary | ICD-10-CM

## 2016-05-18 DIAGNOSIS — R739 Hyperglycemia, unspecified: Secondary | ICD-10-CM

## 2016-05-18 DIAGNOSIS — R519 Headache, unspecified: Secondary | ICD-10-CM

## 2016-05-18 DIAGNOSIS — E876 Hypokalemia: Secondary | ICD-10-CM | POA: Diagnosis not present

## 2016-05-18 LAB — BASIC METABOLIC PANEL
BUN: 26 mg/dL — ABNORMAL HIGH (ref 6–23)
CALCIUM: 9.3 mg/dL (ref 8.4–10.5)
CO2: 32 mEq/L (ref 19–32)
CREATININE: 1.42 mg/dL — AB (ref 0.40–1.20)
Chloride: 96 mEq/L (ref 96–112)
GFR: 49.43 mL/min — AB (ref >60.00)
Glucose, Bld: 137 mg/dL — ABNORMAL HIGH (ref 70–99)
Potassium: 3.4 mEq/L — ABNORMAL LOW (ref 3.5–5.1)
Sodium: 136 mEq/L (ref 135–145)

## 2016-05-18 LAB — LIPID PANEL
CHOL/HDL RATIO: 2
CHOLESTEROL: 186 mg/dL (ref 0–200)
HDL: 92 mg/dL (ref >39.00)
LDL CALC: 79 mg/dL (ref 0–99)
NonHDL: 93.66
TRIGLYCERIDES: 71 mg/dL (ref 0.0–149.0)
VLDL: 14.2 mg/dL (ref 0.0–40.0)

## 2016-05-18 LAB — HEMOGLOBIN A1C: HEMOGLOBIN A1C: 6 % (ref 4.6–6.5)

## 2016-05-18 MED ORDER — LOSARTAN POTASSIUM 100 MG PO TABS: 100.0000 mg | ORAL_TABLET | Freq: Every day | ORAL | 2 refills | Status: DC

## 2016-05-18 MED ORDER — ALPRAZOLAM 0.25 MG PO TABS
0.2500 mg | ORAL_TABLET | Freq: Every evening | ORAL | 0 refills | Status: DC | PRN
Start: 2016-05-18 — End: 2019-07-18

## 2016-05-18 MED FILL — ALPRAZolam 0.25 MG TABS: 0.25 | 30 days supply | Qty: 30 | Fill #0

## 2016-05-18 NOTE — Assessment & Plan Note (Addendum)
Stable Ordered BMP today. Continue healthy diet and regular exercise. Due to persistent hypokalemia, and decreased kidney penetration; hydrochlorothiazide was stopped. Continue losartan. Return to office in 1 month for repeat BMP and blood pressure check.

## 2016-05-18 NOTE — Progress Notes (Signed)
Subjective:    Patient ID: [name], female    DOB: 1961/06/09, 55 y.o.   MRN: [id number]  Patient presents today for complete physical and chronic care f/up  HPI  Immunizations: Health Maintenance  Topic Date Due  . Flu Shot  04/12/2016  . Pap Smear  04/14/2017  . Mammogram  06/15/2017  . Tetanus Vaccine  07/19/2020  . Colon Cancer Screening  07/18/2024  .  Hepatitis C: One time screening is recommended by Center for Disease Control  (CDC) for  adults born from [birth year] through 1965.   Addressed  . HIV Screening  Addressed   Diet: healthy Weight: trying to loss through diet and exercise. Wt Readings from Last 3 Encounters:  05/18/16 242 lb (109.8 kg)  09/10/15 249 lb (112.9 kg)  07/15/15 247 lb (112 kg)   Exercise:walking 3x/week  Depression/Suicide:denies No flowsheet data found. No flowsheet data found. Colonoscopy (every 5-14yrs, >50-24yrs): up to date  Pap Smear (every 85yrs for >21-29 without HPV, every 4yrs for >30-22yrs with HPV):up to date, last done 2016 (normal per patient) by GYN Mammogram (yearly, >54yrs): patient will schedule  Advanced Directives 07/26/2014  Does patient have an advance directive? No  Type of Advance Directive -  Would patient like information on creating an advanced directive? No - patient declined information  Pre-existing out of facility DNR order (yellow form or pink MOST form) -   Menstrual cycle is irregular at this time, last one was 59months ago, no vaginal symtoms. Medications and allergies reviewed with patient and updated if appropriate.  Patient Active Problem List   Diagnosis Date Noted  . Sinus tachycardia ([place]) 09/10/2015  . Hyperglycemia 06/22/2015  . Posterior tibialis tendon insufficiency 04/16/2015  . Headache 07/30/2014  . Primary localized osteoarthrosis, lower leg 01/31/2014  . ALLERGIC RHINITIS 11/25/2010  . Essential hypertension 07/21/2010  . Anxiety state 05/19/2010  . DEPRESSION 05/19/2010      Current Outpatient Prescriptions on File Prior to Visit  Medication Sig Dispense Refill  . aspirin 81 MG chewable tablet Chew 81 mg by mouth daily.    . SUMAtriptan (IMITREX) 50 MG tablet Take 1 tablet (50 mg total) by mouth every 2 (two) hours as needed for migraine or headache. May repeat in 2 hours if headache persists or recurs. 10 tablet 0   No current facility-administered medications on file prior to visit.     Past Medical History:  Diagnosis Date  . Anxiety state, unspecified   . DEPRESSION   . Hypertension   . Sinus tachycardia ([place]) 09/10/2015  . Tear of MCL (medial collateral ligament) of knee 11/2012   left knee    Past Surgical History:  Procedure Laterality Date  . COLONOSCOPY  2015  . DILATATION & CURRETTAGE/HYSTEROSCOPY WITH RESECTOCOPE  08-11-2004   removal polyps  . HAMMER TOE SURGERY Bilateral 03-22-2006   fifth toe  . KNEE ARTHROSCOPY WITH MEDIAL COLLATERAL LIGAMENT RECONSTRUCTION Left 12/18/2012   Procedure: KNEE ARTHROSCOPY WITH OPEN MEDIAL COLLATERAL LIGAMENT RECONSTRUCTION WITH ALLOGRAFT,ANTERIOR CRUCIATE LIGAMENT RESCONSTRUCTION WITH ALLOGRAFT, PARTIAL MEDIALMENISECTOMY/DEBRIDEMENT CHONDROPLASTY;  Surgeon: Sydnee Cabal, MD;  Location: [facility];  Service: Orthopedics;  Laterality: Left;    Social History   Social History  . Marital status: Married    Spouse name: N/A  . Number of children: N/A  . Years of education: N/A   Social History Main Topics  . Smoking status: Former Smoker    Years: 6.00    Types: Cigarettes    Quit  date: 12/12/2000  . Smokeless tobacco: Never Used  . Alcohol use Yes     Comment: White wine  . Drug use: No  . Sexual activity: Not Asked   Other Topics Concern  . None   Social History Narrative  . None    Family History  Problem Relation Age of Onset  . Hypertension Mother   . Cancer Mother     Kidney cancer  . Hypertension Father   . Diabetes Maternal Grandmother     amputation  .  Colon cancer Neg Hx   . Rectal cancer Neg Hx   . Stomach cancer Neg Hx        Review of Systems  Constitutional: Negative.   HENT: Negative.   Eyes: Negative.   Respiratory: Negative.   Cardiovascular: Negative.   Gastrointestinal: Negative.   Genitourinary: Negative.   Musculoskeletal: Negative.   Skin: Negative.   Neurological: Negative.   Endo/Heme/Allergies: Negative.   Psychiatric/Behavioral: Negative for depression, hallucinations, substance abuse and suicidal ideas. The patient is nervous/anxious.        Panic attacks controlled with xanax prn.    Objective:   Vitals:   05/18/16 1026  BP: 108/74  Pulse: (!) 115  Temp: 98.5 F (36.9 C)    Body mass index is 37.9 kg/m.   Physical Examination:  Physical Exam  Constitutional: She is oriented to person, place, and time and well-developed, well-nourished, and in no distress.  HENT:  Head: Normocephalic.  Right Ear: External ear normal.  Left Ear: External ear normal.  Nose: Nose normal.  Mouth/Throat: Oropharynx is clear and moist.  Eyes: Conjunctivae and EOM are normal. Pupils are equal, round, and reactive to light. No scleral icterus.  Neck: Normal range of motion. Neck supple. No thyromegaly present.  Cardiovascular: Normal rate, normal heart sounds and intact distal pulses.   Pulmonary/Chest: Effort normal.  Abdominal: Soft. Bowel sounds are normal.  Genitourinary:  Genitourinary Comments: Vaginal and breast exam deferred to GYN  Lymphadenopathy:    She has no cervical adenopathy.  Neurological: She is alert and oriented to person, place, and time. She has normal reflexes. No cranial nerve deficit. Gait normal.  Skin: Skin is warm and dry.  Vitals reviewed.  Recent Results (from the past 2160 hour(s))  Basic Metabolic Panel (BMET)     Status: Abnormal   Collection Time: 05/18/16 11:09 AM  Result Value Ref Range   Sodium 136 135 - 145 mEq/L   Potassium 3.4 (L) 3.5 - 5.1 mEq/L   Chloride 96 96 -  112 mEq/L   CO2 32 19 - 32 mEq/L   Glucose, Bld 137 (H) 70 - 99 mg/dL   BUN 26 (H) 6 - 23 mg/dL   Creatinine, Ser 1.42 (H) 0.40 - 1.20 mg/dL   Calcium 9.3 8.4 - 10.5 mg/dL   GFR 49.43 (L) >60.00 mL/min  Hemoglobin A1c     Status: None   Collection Time: 05/18/16 11:09 AM  Result Value Ref Range   Hgb A1c MFr Bld 6.0 4.6 - 6.5 %    Comment: Glycemic Control Guidelines for People with Diabetes:Non Diabetic:  <6%Goal of Therapy: <7%Additional Action Suggested:  >8%   Lipid Profile     Status: None   Collection Time: 05/18/16 11:09 AM  Result Value Ref Range   Cholesterol 186 0 - 200 mg/dL    Comment: ATP III Classification       Desirable:  < 200 mg/dL  Borderline High:  200 - 239 mg/dL          High:  > = 240 mg/dL   Triglycerides 71.0 0.0 - 149.0 mg/dL    Comment: Normal:  <150 mg/dLBorderline High:  150 - 199 mg/dL   HDL 92.00 >39.00 mg/dL   VLDL 14.2 0.0 - 40.0 mg/dL   LDL Cholesterol 79 0 - 99 mg/dL   Total CHOL/HDL Ratio 2     Comment:                Men          Women1/2 Average Risk     3.4          3.3Average Risk          5.0          4.42X Average Risk          9.6          7.13X Average Risk          15.0          11.0                       NonHDL 93.66     Comment: NOTE:  Non-HDL goal should be 30 mg/dL higher than patient's LDL goal (i.e. LDL goal of < 70 mg/dL, would have non-HDL goal of < 100 mg/dL)  TSH     Status: None   Collection Time: 05/18/16 11:09 AM  Result Value Ref Range   TSH 0.67 0.35 - 4.50 uIU/mL   ASSESSMENT and PLAN:  Patte was seen today for establish care.  Diagnoses and all orders for this visit:  Essential hypertension -     Basic Metabolic Panel (BMET); Future -     losartan (COZAAR) 100 MG tablet; Take 1 tablet (100 mg total) by mouth daily. -     Basic Metabolic Panel (BMET); Future  Anxiety state -     TSH; Future -     ALPRAZolam (XANAX) 0.25 MG tablet; Take 1 tablet (0.25 mg total) by mouth at bedtime as needed for  anxiety.  Nonintractable headache, unspecified chronicity pattern, unspecified headache type  Hyperglycemia -     Hemoglobin A1c; Future  Encounter for preventative adult health care exam with abnormal findings -     Basic Metabolic Panel (BMET); Future -     Lipid Profile; Future -     TSH; Future  Hypokalemia -     Basic Metabolic Panel (BMET); Future    Essential hypertension Stable Ordered BMP today. Continue healthy diet and regular exercise. Due to persistent hypokalemia, and decreased kidney penetration; hydrochlorothiazide was stopped. Continue losartan. Return to office in 1 month for repeat BMP and blood pressure check.  Anxiety state Patient uses medication sparingly for panic attacks triggered by crossing a bridge or social or work related stress. Current prescription expired, but did not use all pills. Has done psychotherapy is past, was helpful. Refilled xanax. Patient was advised about the need to switch to SSRI, hence will not be getting additional xanax refill.  Headache Stable. Has not needed imitrex in over 22months  Hyperglycemia Today's hemoglobin A1c indicates prediabetes.  She was advised to maintain low carb and low sugar diet, regular daily exercise: for weight loss.  F/up in 1months.     Follow up: Return in about 6 months (around 11/15/2016).  Wilfred Lacy, NP

## 2016-05-18 NOTE — Assessment & Plan Note (Signed)
Stable. Has not needed imitrex in over 69months

## 2016-05-18 NOTE — Telephone Encounter (Signed)
Patient was seen today.  She is out of hydrochlorothiazide.  Needs script sent to [facility] long outpatient pharmacy.

## 2016-05-18 NOTE — Assessment & Plan Note (Addendum)
Patient uses medication sparingly for panic attacks triggered by crossing a bridge or social or work related stress. Current prescription expired, but did not use all pills. Has done psychotherapy is past, was helpful. Refilled xanax. Patient was advised about the need to switch to SSRI, hence will not be getting additional xanax refill.

## 2016-05-18 NOTE — Progress Notes (Signed)
Pre visit review using our clinic review tool, if applicable. No additional management support is needed unless otherwise documented below in the visit note. 

## 2016-05-19 ENCOUNTER — Other Ambulatory Visit: Payer: Self-pay | Admitting: *Deleted

## 2016-05-19 LAB — TSH: TSH: 0.67 u[IU]/mL (ref 0.35–4.50)

## 2016-05-19 MED ORDER — HYDROCHLOROTHIAZIDE 25 MG PO TABS: 25.0000 mg | ORAL_TABLET | Freq: Every morning | ORAL | 1 refills | Status: DC

## 2016-05-19 NOTE — Telephone Encounter (Signed)
I am declining refill request---patient advised

## 2016-05-19 NOTE — Assessment & Plan Note (Signed)
Today's hemoglobin A1c indicates prediabetes.  She was advised to maintain low carb and low sugar diet, regular daily exercise: for weight loss.  F/up in 23months.

## 2016-05-19 NOTE — Telephone Encounter (Signed)
Patient was seen yesterday---med is not on med list, do you want patient taking hctz?----she is requesting refill----please advise, thanks

## 2016-05-19 NOTE — Telephone Encounter (Signed)
Talk to patient today about lab results and medication. She was advised to stop hydrochlorothiazide today. Return to office in 1 month for repeat BMP and blood pressure check. Patient verbalized understanding and agreed with the above plan.

## 2016-06-02 MED FILL — HYDROCHLOROTHIAZIDE 25 MG T: 25 | 90 days supply | Qty: 90 | Fill #0

## 2016-08-17 DIAGNOSIS — J343 Hypertrophy of nasal turbinates: Secondary | ICD-10-CM | POA: Diagnosis not present

## 2016-08-17 DIAGNOSIS — J320 Chronic maxillary sinusitis: Secondary | ICD-10-CM | POA: Diagnosis not present

## 2016-08-17 DIAGNOSIS — J040 Acute laryngitis: Secondary | ICD-10-CM | POA: Diagnosis not present

## 2016-08-17 DIAGNOSIS — J3489 Other specified disorders of nose and nasal sinuses: Secondary | ICD-10-CM | POA: Diagnosis not present

## 2016-08-17 DIAGNOSIS — J322 Chronic ethmoidal sinusitis: Secondary | ICD-10-CM | POA: Diagnosis not present

## 2016-08-17 DIAGNOSIS — J342 Deviated nasal septum: Secondary | ICD-10-CM | POA: Diagnosis not present

## 2016-08-17 MED FILL — CEFUROXIME AXETIL 250 MG TA: 250 | 10 days supply | Qty: 20 | Fill #0

## 2016-11-15 ENCOUNTER — Ambulatory Visit: Payer: 59 | Admitting: Nurse Practitioner

## 2017-01-06 MED FILL — TINIDAZOLE 500 MG TABLET: 500 | 2 days supply | Qty: 8 | Fill #0

## 2017-01-06 MED FILL — FLUCONAZOLE 150 MG TABLET: 150 | 7 days supply | Qty: 3 | Fill #0

## 2017-01-17 DIAGNOSIS — Z1231 Encounter for screening mammogram for malignant neoplasm of breast: Secondary | ICD-10-CM | POA: Diagnosis not present

## 2017-01-17 DIAGNOSIS — N760 Acute vaginitis: Secondary | ICD-10-CM | POA: Diagnosis not present

## 2017-01-17 DIAGNOSIS — Z1151 Encounter for screening for human papillomavirus (HPV): Secondary | ICD-10-CM | POA: Diagnosis not present

## 2017-01-17 DIAGNOSIS — Z01419 Encounter for gynecological examination (general) (routine) without abnormal findings: Secondary | ICD-10-CM | POA: Diagnosis not present

## 2017-01-17 DIAGNOSIS — Z6841 Body Mass Index (BMI) 40.0 and over, adult: Secondary | ICD-10-CM | POA: Diagnosis not present

## 2017-01-18 ENCOUNTER — Other Ambulatory Visit: Payer: Self-pay | Admitting: Obstetrics and Gynecology

## 2017-01-18 DIAGNOSIS — R928 Other abnormal and inconclusive findings on diagnostic imaging of breast: Secondary | ICD-10-CM

## 2017-01-24 ENCOUNTER — Ambulatory Visit
Admission: RE | Admit: 2017-01-24 | Discharge: 2017-01-24 | Disposition: A | Discharge status: Home / Self Care | Payer: 59 | Source: Ambulatory Visit | Attending: Obstetrics and Gynecology | Admitting: Obstetrics and Gynecology

## 2017-01-24 ENCOUNTER — Other Ambulatory Visit: Payer: Self-pay | Admitting: Obstetrics and Gynecology

## 2017-01-24 DIAGNOSIS — Z1321 Encounter for screening for nutritional disorder: Secondary | ICD-10-CM | POA: Diagnosis not present

## 2017-01-24 DIAGNOSIS — R928 Other abnormal and inconclusive findings on diagnostic imaging of breast: Secondary | ICD-10-CM | POA: Diagnosis not present

## 2017-01-24 DIAGNOSIS — Z130 Encounter for screening for diseases of the blood and blood-forming organs and certain disorders involving the immune mechanism: Secondary | ICD-10-CM | POA: Diagnosis not present

## 2017-01-24 DIAGNOSIS — N6311 Unspecified lump in the right breast, upper outer quadrant: Secondary | ICD-10-CM | POA: Diagnosis not present

## 2017-01-24 DIAGNOSIS — Z0000 Encounter for general adult medical examination without abnormal findings: Secondary | ICD-10-CM | POA: Diagnosis not present

## 2017-01-24 DIAGNOSIS — Z833 Family history of diabetes mellitus: Secondary | ICD-10-CM | POA: Diagnosis not present

## 2017-01-24 DIAGNOSIS — N6310 Unspecified lump in the right breast, unspecified quadrant: Secondary | ICD-10-CM

## 2017-01-24 DIAGNOSIS — Z1329 Encounter for screening for other suspected endocrine disorder: Secondary | ICD-10-CM | POA: Diagnosis not present

## 2017-01-24 DIAGNOSIS — Z13220 Encounter for screening for lipoid disorders: Secondary | ICD-10-CM | POA: Diagnosis not present

## 2017-02-02 MED FILL — VIT D2 1.25 MG (50,000 UNIT: 1.25 MG | 84 days supply | Qty: 12 | Fill #0

## 2017-03-10 DIAGNOSIS — G5603 Carpal tunnel syndrome, bilateral upper limbs: Secondary | ICD-10-CM | POA: Diagnosis not present

## 2017-03-10 DIAGNOSIS — M542 Cervicalgia: Secondary | ICD-10-CM | POA: Diagnosis not present

## 2017-03-10 DIAGNOSIS — Z6839 Body mass index (BMI) 39.0-39.9, adult: Secondary | ICD-10-CM | POA: Diagnosis not present

## 2017-03-10 DIAGNOSIS — M5030 Other cervical disc degeneration, unspecified cervical region: Secondary | ICD-10-CM | POA: Diagnosis not present

## 2017-03-10 DIAGNOSIS — I10 Essential (primary) hypertension: Secondary | ICD-10-CM | POA: Diagnosis not present

## 2017-03-10 DIAGNOSIS — M47812 Spondylosis without myelopathy or radiculopathy, cervical region: Secondary | ICD-10-CM | POA: Diagnosis not present

## 2017-08-01 ENCOUNTER — Other Ambulatory Visit: Payer: 59

## 2017-11-16 DIAGNOSIS — K5900 Constipation, unspecified: Secondary | ICD-10-CM | POA: Diagnosis not present

## 2017-11-16 DIAGNOSIS — R5383 Other fatigue: Secondary | ICD-10-CM | POA: Diagnosis not present

## 2017-11-16 DIAGNOSIS — E663 Overweight: Secondary | ICD-10-CM | POA: Diagnosis not present

## 2017-11-16 DIAGNOSIS — I10 Essential (primary) hypertension: Secondary | ICD-10-CM | POA: Diagnosis not present

## 2017-11-16 DIAGNOSIS — Z6839 Body mass index (BMI) 39.0-39.9, adult: Secondary | ICD-10-CM | POA: Diagnosis not present

## 2017-11-16 DIAGNOSIS — R635 Abnormal weight gain: Secondary | ICD-10-CM | POA: Diagnosis not present

## 2017-11-16 DIAGNOSIS — G4701 Insomnia due to medical condition: Secondary | ICD-10-CM | POA: Diagnosis not present

## 2017-11-16 MED FILL — OLMESARTAN-HCTZ 40-12.5 MG: 40-12.5 | 30 days supply | Qty: 30 | Fill #0

## 2017-12-07 DIAGNOSIS — E663 Overweight: Secondary | ICD-10-CM | POA: Diagnosis not present

## 2017-12-07 DIAGNOSIS — G4700 Insomnia, unspecified: Secondary | ICD-10-CM | POA: Diagnosis not present

## 2017-12-07 DIAGNOSIS — Z6837 Body mass index (BMI) 37.0-37.9, adult: Secondary | ICD-10-CM | POA: Diagnosis not present

## 2017-12-07 DIAGNOSIS — I10 Essential (primary) hypertension: Secondary | ICD-10-CM | POA: Diagnosis not present

## 2017-12-07 MED FILL — hydrOXYzine HCL 25 MG TABS: 25 | 30 days supply | Qty: 30 | Fill #0

## 2018-01-22 MED FILL — OLMESARTAN-HCTZ 40-12.5 MG: 40-12.5 | 30 days supply | Qty: 30 | Fill #0

## 2018-03-08 DIAGNOSIS — Z6836 Body mass index (BMI) 36.0-36.9, adult: Secondary | ICD-10-CM | POA: Diagnosis not present

## 2018-03-08 DIAGNOSIS — I10 Essential (primary) hypertension: Secondary | ICD-10-CM | POA: Diagnosis not present

## 2018-03-29 DIAGNOSIS — E669 Obesity, unspecified: Secondary | ICD-10-CM | POA: Diagnosis not present

## 2018-03-29 DIAGNOSIS — R739 Hyperglycemia, unspecified: Secondary | ICD-10-CM | POA: Diagnosis not present

## 2018-03-29 DIAGNOSIS — R6882 Decreased libido: Secondary | ICD-10-CM | POA: Diagnosis not present

## 2018-03-29 DIAGNOSIS — E559 Vitamin D deficiency, unspecified: Secondary | ICD-10-CM | POA: Diagnosis not present

## 2018-03-29 DIAGNOSIS — N951 Menopausal and female climacteric states: Secondary | ICD-10-CM | POA: Diagnosis not present

## 2018-03-29 DIAGNOSIS — R5383 Other fatigue: Secondary | ICD-10-CM | POA: Diagnosis not present

## 2018-03-29 DIAGNOSIS — Z13220 Encounter for screening for lipoid disorders: Secondary | ICD-10-CM | POA: Diagnosis not present

## 2018-04-09 DIAGNOSIS — Z0389 Encounter for observation for other suspected diseases and conditions ruled out: Secondary | ICD-10-CM | POA: Diagnosis not present

## 2018-04-13 MED FILL — OLMESARTAN-HCTZ 40-12.5 MG: 40-12.5 | 30 days supply | Qty: 30 | Fill #0

## 2018-05-16 DIAGNOSIS — E282 Polycystic ovarian syndrome: Secondary | ICD-10-CM | POA: Diagnosis not present

## 2018-05-16 DIAGNOSIS — N951 Menopausal and female climacteric states: Secondary | ICD-10-CM | POA: Diagnosis not present

## 2018-05-16 DIAGNOSIS — E559 Vitamin D deficiency, unspecified: Secondary | ICD-10-CM | POA: Diagnosis not present

## 2018-05-16 DIAGNOSIS — E669 Obesity, unspecified: Secondary | ICD-10-CM | POA: Diagnosis not present

## 2018-05-16 MED FILL — PROGESTERONE 100 MG CAPSULE: 100 | 30 days supply | Qty: 30 | Fill #0

## 2018-05-16 MED FILL — ESTRADIOL 0.5 MG TABLET: 0.5 | 30 days supply | Qty: 30 | Fill #0

## 2018-06-12 MED FILL — OLMESARTAN-HCTZ 40-12.5 MG: 40-12.5 | 30 days supply | Qty: 30 | Fill #1

## 2018-06-15 MED FILL — ESTRADIOL 0.5 MG TABLET: 0.5 | 30 days supply | Qty: 30 | Fill #1

## 2018-06-27 DIAGNOSIS — N951 Menopausal and female climacteric states: Secondary | ICD-10-CM | POA: Diagnosis not present

## 2018-06-27 DIAGNOSIS — R5383 Other fatigue: Secondary | ICD-10-CM | POA: Diagnosis not present

## 2018-06-27 DIAGNOSIS — E669 Obesity, unspecified: Secondary | ICD-10-CM | POA: Diagnosis not present

## 2018-06-27 DIAGNOSIS — R6882 Decreased libido: Secondary | ICD-10-CM | POA: Diagnosis not present

## 2018-06-27 DIAGNOSIS — E282 Polycystic ovarian syndrome: Secondary | ICD-10-CM | POA: Diagnosis not present

## 2018-06-27 MED FILL — NP THYROID 60 MG TABLET: 60 | 30 days supply | Qty: 30 | Fill #0

## 2018-06-28 MED FILL — PROGESTERONE 200 MG CAPSULE: 200 | 30 days supply | Qty: 30 | Fill #0

## 2018-07-27 MED FILL — OLMESARTAN-HCTZ 40-12.5 MG: 40-12.5 | 30 days supply | Qty: 30 | Fill #2

## 2018-07-27 MED FILL — ESTRADIOL 0.5 MG TABLET: 0.5 | 30 days supply | Qty: 30 | Fill #2

## 2018-07-27 MED FILL — NP THYROID 60 MG TABLET: 60 | 30 days supply | Qty: 30 | Fill #1

## 2018-07-27 MED FILL — PROGESTERONE 200 MG CAPSULE: 200 | 30 days supply | Qty: 30 | Fill #1

## 2018-08-28 DIAGNOSIS — Z713 Dietary counseling and surveillance: Secondary | ICD-10-CM | POA: Diagnosis not present

## 2018-08-28 DIAGNOSIS — I10 Essential (primary) hypertension: Secondary | ICD-10-CM | POA: Diagnosis not present

## 2018-08-28 DIAGNOSIS — E669 Obesity, unspecified: Secondary | ICD-10-CM | POA: Diagnosis not present

## 2018-08-28 DIAGNOSIS — F419 Anxiety disorder, unspecified: Secondary | ICD-10-CM | POA: Diagnosis not present

## 2018-09-03 MED FILL — ESTRADIOL 0.5 MG TABLET: 0.5 | 30 days supply | Qty: 30 | Fill #3

## 2018-09-03 MED FILL — NP THYROID 60 MG TABLET: 60 | 30 days supply | Qty: 30 | Fill #2

## 2018-09-10 MED FILL — ALPRAZolam 0.25 MG TABS: 0.25 | 14 days supply | Qty: 14 | Fill #0

## 2018-09-18 DIAGNOSIS — I10 Essential (primary) hypertension: Secondary | ICD-10-CM | POA: Diagnosis not present

## 2018-09-18 DIAGNOSIS — R5383 Other fatigue: Secondary | ICD-10-CM | POA: Diagnosis not present

## 2018-09-18 DIAGNOSIS — E669 Obesity, unspecified: Secondary | ICD-10-CM | POA: Diagnosis not present

## 2018-10-01 MED FILL — PROGESTERONE 200 MG CAPSULE: 200 | 30 days supply | Qty: 30 | Fill #2

## 2018-10-01 MED FILL — OLMESARTAN-HCTZ 40-12.5 MG: 40-12.5 | 30 days supply | Qty: 30 | Fill #1

## 2018-10-03 MED FILL — NP THYROID 90 MG TABLET: 90 | 30 days supply | Qty: 30 | Fill #0

## 2018-11-01 MED FILL — NP THYROID 90 MG TABLET: 90 | 30 days supply | Qty: 30 | Fill #1

## 2018-11-02 MED FILL — ESTRADIOL 0.5 MG TABS: 0.5 | 30 days supply | Qty: 30 | Fill #0

## 2018-11-13 DIAGNOSIS — N951 Menopausal and female climacteric states: Secondary | ICD-10-CM | POA: Diagnosis not present

## 2018-11-13 DIAGNOSIS — E282 Polycystic ovarian syndrome: Secondary | ICD-10-CM | POA: Diagnosis not present

## 2018-11-13 DIAGNOSIS — R5383 Other fatigue: Secondary | ICD-10-CM | POA: Diagnosis not present

## 2018-11-13 DIAGNOSIS — I10 Essential (primary) hypertension: Secondary | ICD-10-CM | POA: Diagnosis not present

## 2018-11-14 MED FILL — ESTRADIOL 1 MG TABS: 1 | 30 days supply | Qty: 30 | Fill #0

## 2018-11-14 MED FILL — ALPRAZolam 0.5 MG TABS: 0.5 | 14 days supply | Qty: 14 | Fill #0

## 2018-11-14 MED FILL — PROGESTERONE 200 MG CAPSULE: 200 | 30 days supply | Qty: 60 | Fill #0

## 2018-12-03 MED FILL — NP THYROID 90 MG TABLET: 90 | 30 days supply | Qty: 30 | Fill #2

## 2018-12-03 MED FILL — OLMESARTAN-HCTZ 40-12.5 MG: 40-12.5 | 90 days supply | Qty: 45 | Fill #0

## 2018-12-25 MED FILL — ALPRAZolam 0.5 MG TABS: 0.5 | 15 days supply | Qty: 30 | Fill #0

## 2019-01-02 MED FILL — ESTRADIOL 1 MG TABS: 1 | 30 days supply | Qty: 30 | Fill #1

## 2019-01-02 MED FILL — NP THYROID 90 MG TABLET: 90 | 30 days supply | Qty: 30 | Fill #3

## 2019-01-14 ENCOUNTER — Telehealth: Payer: Self-pay | Admitting: Nurse Practitioner

## 2019-01-14 NOTE — Telephone Encounter (Signed)
Called and talked with patient. She states that she is seeing another PCP.

## 2019-01-16 ENCOUNTER — Ambulatory Visit (INDEPENDENT_AMBULATORY_CARE_PROVIDER_SITE_OTHER): Payer: 59 | Admitting: Internal Medicine

## 2019-01-30 DIAGNOSIS — R6882 Decreased libido: Secondary | ICD-10-CM | POA: Diagnosis not present

## 2019-01-30 DIAGNOSIS — E282 Polycystic ovarian syndrome: Secondary | ICD-10-CM | POA: Diagnosis not present

## 2019-01-30 DIAGNOSIS — E669 Obesity, unspecified: Secondary | ICD-10-CM | POA: Diagnosis not present

## 2019-01-30 DIAGNOSIS — R5383 Other fatigue: Secondary | ICD-10-CM | POA: Diagnosis not present

## 2019-01-30 DIAGNOSIS — N951 Menopausal and female climacteric states: Secondary | ICD-10-CM | POA: Diagnosis not present

## 2019-01-30 DIAGNOSIS — I10 Essential (primary) hypertension: Secondary | ICD-10-CM | POA: Diagnosis not present

## 2019-01-30 DIAGNOSIS — Z713 Dietary counseling and surveillance: Secondary | ICD-10-CM | POA: Diagnosis not present

## 2019-02-05 DIAGNOSIS — N951 Menopausal and female climacteric states: Secondary | ICD-10-CM | POA: Diagnosis not present

## 2019-02-05 DIAGNOSIS — R5383 Other fatigue: Secondary | ICD-10-CM | POA: Diagnosis not present

## 2019-02-05 DIAGNOSIS — E669 Obesity, unspecified: Secondary | ICD-10-CM | POA: Diagnosis not present

## 2019-02-05 MED FILL — NP THYROID 120 MG TABLET: 120 | 30 days supply | Qty: 30 | Fill #0

## 2019-02-08 MED FILL — ESTRADIOL 1 MG TABS: 1 | 30 days supply | Qty: 30 | Fill #2

## 2019-02-11 MED FILL — PROGESTERONE 200 MG CAPSULE: 200 | 30 days supply | Qty: 60 | Fill #1

## 2019-03-05 MED FILL — OLMESARTAN-HCTZ 40-12.5 MG: 40-12.5 | 30 days supply | Qty: 15 | Fill #0

## 2019-03-11 MED FILL — NP THYROID 120 MG TABLET: 120 | 30 days supply | Qty: 30 | Fill #1

## 2019-03-11 MED FILL — ESTRADIOL 1 MG TABS: 1 | 30 days supply | Qty: 30 | Fill #3

## 2019-03-20 MED FILL — ALPRAZolam 0.5 MG TABS: 0.5 | 15 days supply | Qty: 30 | Fill #1

## 2019-03-21 MED FILL — PROGESTERONE 200 MG CAPSULE: 200 | 30 days supply | Qty: 60 | Fill #2

## 2019-03-26 ENCOUNTER — Other Ambulatory Visit: Payer: Self-pay | Admitting: Obstetrics and Gynecology

## 2019-03-26 DIAGNOSIS — N6310 Unspecified lump in the right breast, unspecified quadrant: Secondary | ICD-10-CM

## 2019-04-03 DIAGNOSIS — E282 Polycystic ovarian syndrome: Secondary | ICD-10-CM | POA: Diagnosis not present

## 2019-04-03 DIAGNOSIS — I10 Essential (primary) hypertension: Secondary | ICD-10-CM | POA: Diagnosis not present

## 2019-04-03 DIAGNOSIS — R6882 Decreased libido: Secondary | ICD-10-CM | POA: Diagnosis not present

## 2019-04-03 DIAGNOSIS — E669 Obesity, unspecified: Secondary | ICD-10-CM | POA: Diagnosis not present

## 2019-04-04 MED FILL — NP THYROID 120 MG TABLET: 120 | 30 days supply | Qty: 30 | Fill #2

## 2019-04-04 MED FILL — OLMESARTAN-HCTZ 40-12.5 MG: 40-12.5 | 30 days supply | Qty: 15 | Fill #1

## 2019-04-11 MED FILL — ESTRADIOL 1 MG TABS: 1 | 30 days supply | Qty: 30 | Fill #0

## 2019-04-12 ENCOUNTER — Ambulatory Visit: Payer: 59

## 2019-04-12 ENCOUNTER — Other Ambulatory Visit: Payer: Self-pay

## 2019-04-12 ENCOUNTER — Ambulatory Visit
Admission: RE | Admit: 2019-04-12 | Discharge: 2019-04-12 | Disposition: A | Discharge status: Home / Self Care | Payer: 59 | Source: Ambulatory Visit | Attending: Obstetrics and Gynecology | Admitting: Obstetrics and Gynecology

## 2019-04-12 DIAGNOSIS — R928 Other abnormal and inconclusive findings on diagnostic imaging of breast: Secondary | ICD-10-CM | POA: Diagnosis not present

## 2019-04-12 DIAGNOSIS — N6310 Unspecified lump in the right breast, unspecified quadrant: Secondary | ICD-10-CM

## 2019-04-23 MED FILL — ESTRADIOL 1 MG TABS: 1 | 30 days supply | Qty: 30 | Fill #0

## 2019-05-08 MED FILL — NP THYROID 120 MG TABLET: 120 | 30 days supply | Qty: 30 | Fill #3

## 2019-05-08 MED FILL — PROGESTERONE 200 MG CAPSULE: 200 | 30 days supply | Qty: 60 | Fill #3

## 2019-05-08 MED FILL — OLMESARTAN-HCTZ 40-12.5 MG: 40-12.5 | 30 days supply | Qty: 15 | Fill #2

## 2019-05-16 ENCOUNTER — Ambulatory Visit (INDEPENDENT_AMBULATORY_CARE_PROVIDER_SITE_OTHER): Payer: 59 | Admitting: Internal Medicine

## 2019-05-16 ENCOUNTER — Other Ambulatory Visit: Payer: Self-pay

## 2019-05-16 ENCOUNTER — Encounter (INDEPENDENT_AMBULATORY_CARE_PROVIDER_SITE_OTHER): Payer: Self-pay | Admitting: Internal Medicine

## 2019-05-16 VITALS — BP 138/80 | HR 72 | Ht 66.0 in | Wt 212.8 lb

## 2019-05-16 DIAGNOSIS — E669 Obesity, unspecified: Secondary | ICD-10-CM

## 2019-05-16 DIAGNOSIS — E559 Vitamin D deficiency, unspecified: Secondary | ICD-10-CM | POA: Diagnosis not present

## 2019-05-16 DIAGNOSIS — I10 Essential (primary) hypertension: Secondary | ICD-10-CM

## 2019-05-16 DIAGNOSIS — E282 Polycystic ovarian syndrome: Secondary | ICD-10-CM

## 2019-05-16 HISTORY — DX: Vitamin D deficiency, unspecified: E55.9

## 2019-05-16 HISTORY — DX: Polycystic ovarian syndrome: E28.2

## 2019-05-16 HISTORY — DX: Obesity, unspecified: E66.9

## 2019-05-16 NOTE — Patient Instructions (Addendum)
[name] Dietary Recommendations for Weight Loss What to Avoid . Avoid added sugars o Often added sugar can be found in processed foods such as many condiments, dry cereals, cakes, cookies, chips, crisps, crackers, candies, sweetened drinks, etc.  o Read labels and AVOID/DECREASE use of foods with the following in their ingredient list: Sugar, fructose, high fructose corn syrup, sucrose, glucose, maltose, dextrose, molasses, cane sugar, brown sugar, any type of syrup, agave nectar, etc.   . Avoid snacking in between meals . Avoid foods made with flour o If you are going to eat food made with flour, choose those made with whole-grains; and, minimize your consumption as much as is tolerable . Avoid processed foods o These foods are generally stocked in the middle of the grocery store. Focus on shopping on the perimeter of the grocery.  What to Include . Vegetables o GREEN LEAFY VEGETABLES: Kale, spinach, mustard greens, collard greens, cabbage, broccoli, etc. o OTHER: Asparagus, cauliflower, eggplant, carrots, peas, Brussel sprouts, tomatoes, bell peppers, zucchini, beets, cucumbers, etc. . Grains, seeds, and legumes o Beans: kidney beans, black eyed peas, garbanzo beans, black beans, pinto beans, etc. o Whole, unrefined grains: brown rice, barley, bulgur, oatmeal, etc. . Healthy fats  o Avoid highly processed fats such as vegetable oil o Examples of healthy fats: avocado, olives, virgin olive oil, dark chocolate (?72% Cocoa), nuts (peanuts, almonds, walnuts, cashews, pecans, etc.) . Low - Moderate Intake of Animal Sources of Protein o Meat sources: chicken, turkey, salmon, tuna. Limit to 4 ounces of meat at one time. o Consider limiting dairy sources, but when choosing dairy focus on: PLAIN Greek yogurt, cottage cheese, high-protein milk . Fruit o Choose berries  When to Eat . Intermittent Fasting: o Choosing not to eat for a specific time period, but DO FOCUS ON HYDRATION  when fasting o Multiple Techniques: - Time Restricted Eating: eat 3 meals in a day, each meal lasting no more than 60 minutes, no snacks between meals - 16-18 hour fast: fast for 16 to 18 hours up to 7 days a week. Often suggested to start with 2-3 nonconsecutive days per week.  . Remember the time you sleep is counted as fasting.  . Examples of eating schedule: Fast from 7:00pm-11:00am. Eat between 11:00am-7:00pm.  - 24-hour fast: fast for 24 hours up to every other day. Often suggested to start with 1 day per week . Remember the time you sleep is counted as fasting . Examples of eating schedule:  o Eating day: eat 2-3 meals on your eating day. If doing 2 meals, each meal should last no more than 90 minutes. If doing 3 meals, each meal should last no more than 60 minutes. Finish last meal by 7:00pm. o Fasting day: Fast until 7:00pm.  o IF YOU FEEL UNWELL FOR ANY REASON/IN ANY WAY WHEN FASTING, STOP FASTING BY EATING A NUTRITIOUS SNACK OR LIGHT MEAL o ALWAYS FOCUS ON HYDRATION DURING FASTS - Acceptable Hydration sources: water, broths, tea/coffee (black tea/coffee is best but using a small amount of whole-fat dairy products in coffee/tea is acceptable).  - Poor Hydration Sources: anything with sugar or artificial sweeteners added to it  These recommendations have been developed for patients that are actively receiving medical care from either Dr. Ayda Tancredi or Sarah Gray, DNP, NP-C at [facility]. These recommendations are developed for patients with specific medical conditions and are not meant to be distributed or used by others that are not actively receiving care from either provider listed   above at [facility]. It is not appropriate to participate in the above eating plans without proper medical supervision.   Reference: Rexanne Mano. The obesity code. [place]; 2016.  Go to [employer].com for Vitamin D3 5000 UNITS -take 2 daily.

## 2019-05-16 NOTE — Progress Notes (Signed)
Subjective:  Patient ID: [name], female    DOB: [birth year]  Age: 58 y.o. MRN: [id number]  CC: This lady comes in for follow-up regarding her obesity, PCOS, hypertension and depression.  HPI She continues to do well with intermittent fasting and eating healthier based on diet of lower carbohydrate, moderate protein and higher fat.  She continues to drink plenty of water and stay well-hydrated.  She also exercises every day with cycling and strength training.  She continues to take bioidentical hormone therapy with estradiol and progesterone.  She has been started on testosterone injections on a weekly basis but she is only taken 3 injections so far.  She already feels that her energy levels are improved, her sex drive is certainly better. I do see that she is not taking any vitamin D3 supplementation.   Past Medical History:  Diagnosis Date  . Anxiety state, unspecified   . DEPRESSION   . Hypertension   . Obesity (BMI 35.0-39.9 without comorbidity) 05/16/2019  . PCOS (polycystic ovarian syndrome) 05/16/2019  . Sinus tachycardia 09/10/2015  . Tear of MCL (medial collateral ligament) of knee 11/2012   left knee  . Vitamin D deficiency disease 05/16/2019     Social History   Social History Narrative  . Not on file   Social History   Substance and Sexual Activity  Alcohol Use Yes   Comment: White wine    Social History   Tobacco Use  Smoking Status Former Smoker  . Years: 6.00  . Types: Cigarettes  . Quit date: 12/12/2000  . Years since quitting: 18.4  Smokeless Tobacco Never Used    Married   Current Meds  Medication Sig  . ALPRAZolam (XANAX) 0.25 MG tablet Take 1 tablet (0.25 mg total) by mouth at bedtime as needed for anxiety.  Marland Kitchen estradiol (ESTRACE) 1 MG tablet Take 1 mg by mouth daily.  . NP THYROID 120 MG tablet Take 120 mg by mouth daily before breakfast.  . olmesartan-hydrochlorothiazide (BENICAR HCT) 40-12.5 MG tablet Take 1 tablet by mouth daily.  .  progesterone (PROMETRIUM) 200 MG capsule Take 400 mg by mouth daily at 12 noon.  . TESTOSTERONE CYPIONATE IJ Inject 5 mg as directed every 7 (seven) days. Compounded       Objective:   Today's Vitals: BP 138/80   Pulse 72   Ht 5\' 6"  (1.676 m)   Wt 212 lb 12.8 oz (96.5 kg)   LMP  (Approximate)   BMI 34.35 kg/m  Vitals with BMI 05/16/2019 05/18/2016 09/10/2015  Height 5\' 6"  5\' 7"  5\' 7"   Weight 212 lbs 13 oz 242 lbs 249 lbs  BMI [id number]  Systolic 0000000 [id number]  Diastolic 80 74 80  Pulse 72 115 110       Physical Exam  She looks systemically well and she has lost a further 9 pounds in weight since the last time I had seen her.  Assessment     1. Essential hypertension   2. PCOS (polycystic ovarian syndrome)   3. Vitamin D deficiency disease   4. Obesity (BMI 35.0-39.9 without comorbidity)       Plan 1.  Continue with all medications for chronic conditions. 2.  Continue with intermittent fasting and I would recommend now that she tries to achieve 16 hours of fasting every single day.  At the present time, she is fasting 2 days a week.  Continue with exercise with cycling and strength training. 3.  I  will follow her up in about couple of months and we will do all the blood work then.  Tests Ordered:  No orders of the defined types were placed in this encounter.    Doree Albee, MD

## 2019-05-23 MED FILL — ESTRADIOL 1 MG TABS: 1 | 30 days supply | Qty: 30 | Fill #1

## 2019-06-12 ENCOUNTER — Other Ambulatory Visit (INDEPENDENT_AMBULATORY_CARE_PROVIDER_SITE_OTHER): Payer: Self-pay | Admitting: Internal Medicine

## 2019-06-12 ENCOUNTER — Other Ambulatory Visit (INDEPENDENT_AMBULATORY_CARE_PROVIDER_SITE_OTHER): Payer: Self-pay

## 2019-06-12 DIAGNOSIS — E282 Polycystic ovarian syndrome: Secondary | ICD-10-CM

## 2019-06-12 MED ORDER — NP THYROID 120 MG PO TABS: 120.0000 mg | ORAL_TABLET | Freq: Every day | ORAL | 3 refills | Status: DC

## 2019-06-12 MED FILL — NP THYROID 120 MG TABLET: 120 | 30 days supply | Qty: 30 | Fill #0

## 2019-06-12 MED FILL — OLMESARTAN-HCTZ 40-12.5 MG: 40-12.5 | 30 days supply | Qty: 15 | Fill #3

## 2019-06-13 ENCOUNTER — Telehealth (INDEPENDENT_AMBULATORY_CARE_PROVIDER_SITE_OTHER): Payer: Self-pay

## 2019-06-13 ENCOUNTER — Other Ambulatory Visit (INDEPENDENT_AMBULATORY_CARE_PROVIDER_SITE_OTHER): Payer: Self-pay | Admitting: Internal Medicine

## 2019-06-13 DIAGNOSIS — E282 Polycystic ovarian syndrome: Secondary | ICD-10-CM

## 2019-06-13 DIAGNOSIS — R519 Headache, unspecified: Secondary | ICD-10-CM

## 2019-06-13 MED ORDER — PROGESTERONE MICRONIZED 200 MG PO CAPS: 400.0000 mg | ORAL_CAPSULE | Freq: Every day | ORAL | 3 refills | Status: DC

## 2019-06-13 MED FILL — PROGESTERONE 200 MG CAPSULE: 200 | 30 days supply | Qty: 60 | Fill #0

## 2019-06-20 NOTE — Telephone Encounter (Signed)
done

## 2019-06-24 MED FILL — ESTRADIOL 1 MG TABS: 1 | 30 days supply | Qty: 30 | Fill #2

## 2019-07-11 MED FILL — OLMESARTAN-HCTZ 40-12.5 MG: 40-12.5 | 30 days supply | Qty: 15 | Fill #4

## 2019-07-11 MED FILL — NP THYROID 120 MG TABLET: 120 | 30 days supply | Qty: 30 | Fill #1

## 2019-07-18 ENCOUNTER — Other Ambulatory Visit (INDEPENDENT_AMBULATORY_CARE_PROVIDER_SITE_OTHER): Payer: Self-pay | Admitting: Internal Medicine

## 2019-07-18 ENCOUNTER — Other Ambulatory Visit (INDEPENDENT_AMBULATORY_CARE_PROVIDER_SITE_OTHER): Payer: Self-pay

## 2019-07-18 DIAGNOSIS — F411 Generalized anxiety disorder: Secondary | ICD-10-CM

## 2019-07-18 MED ORDER — ALPRAZOLAM 0.25 MG PO TABS: 0.2500 mg | ORAL_TABLET | Freq: Every evening | ORAL | 0 refills | Status: DC | PRN

## 2019-07-18 MED FILL — ALPRAZolam 0.25 MG TABS: 0.25 | 30 days supply | Qty: 30 | Fill #0

## 2019-07-24 ENCOUNTER — Ambulatory Visit (INDEPENDENT_AMBULATORY_CARE_PROVIDER_SITE_OTHER): Payer: 59 | Admitting: Internal Medicine

## 2019-07-30 MED FILL — ESTRADIOL 1 MG TABS: 1 | 30 days supply | Qty: 30 | Fill #3

## 2019-07-30 MED FILL — PROGESTERONE 200 MG CAPSULE: 200 | 30 days supply | Qty: 60 | Fill #1

## 2019-08-14 MED FILL — OLMESARTAN-HCTZ 40-12.5 MG: 40-12.5 | 30 days supply | Qty: 15 | Fill #5

## 2019-08-14 MED FILL — NP THYROID 120 MG TABLET: 120 | 30 days supply | Qty: 30 | Fill #2

## 2019-09-03 MED FILL — ESTRADIOL 1 MG TABS: 1 | 30 days supply | Qty: 30 | Fill #0

## 2019-09-17 ENCOUNTER — Other Ambulatory Visit (INDEPENDENT_AMBULATORY_CARE_PROVIDER_SITE_OTHER): Payer: Self-pay | Admitting: Internal Medicine

## 2019-09-17 MED FILL — OLMESARTAN-HCTZ 40-12.5 MG: 40-12.5 | 30 days supply | Qty: 15 | Fill #0

## 2019-09-17 MED FILL — NP THYROID 120 MG TABLET: 120 | 30 days supply | Qty: 30 | Fill #3

## 2019-09-18 ENCOUNTER — Other Ambulatory Visit (INDEPENDENT_AMBULATORY_CARE_PROVIDER_SITE_OTHER): Payer: Self-pay

## 2019-09-18 DIAGNOSIS — F411 Generalized anxiety disorder: Secondary | ICD-10-CM

## 2019-09-18 MED ORDER — ALPRAZOLAM 0.25 MG PO TABS: 0.2500 mg | ORAL_TABLET | Freq: Every evening | ORAL | 0 refills | Status: DC | PRN

## 2019-09-18 MED ORDER — OLMESARTAN MEDOXOMIL-HCTZ 40-12.5 MG PO TABS: 0.5000 | ORAL_TABLET | Freq: Every day | ORAL | 0 refills | Status: DC

## 2019-09-18 MED FILL — ALPRAZolam 0.25 MG TABS: 0.25 | 30 days supply | Qty: 30 | Fill #0

## 2019-10-01 ENCOUNTER — Telehealth (INDEPENDENT_AMBULATORY_CARE_PROVIDER_SITE_OTHER): Payer: Self-pay

## 2019-10-01 NOTE — Telephone Encounter (Signed)
I was able to call this patient back.  And she tells me she continues to have fatigue and headache.  She tells me that tested positive for SARS-CoV-2 on 09/25/2019.  I do not see where this test was completed within the [facility] health system as these results are not under her result review.  However she tells me she discussed her symptoms with her director and her director told her that she would require a work note to stay out of work.  I will write her a note and fax it to her director Georga Kaufmann at [phone].

## 2019-10-01 NOTE — Telephone Encounter (Signed)
Please asked Judson Roch to do with this.  Thanks.

## 2019-10-01 NOTE — Telephone Encounter (Signed)
Please review the phone note to contact her .

## 2019-10-01 NOTE — Telephone Encounter (Signed)
Pt called from work very tearful and crying stating that  in her Dept called her and told her to come back to work. While she was still postive in the symptoms. Pt has been in bed since last Tuesday 1/12. Pt said she was  still feeling terrible and she does not want to pass this to [place] she works with but Leadership made her come back into work. She is requesting  for Dr Anastasio Champion to help her and let her know. She can't make it at work ;she is not well.

## 2019-10-02 ENCOUNTER — Encounter (INDEPENDENT_AMBULATORY_CARE_PROVIDER_SITE_OTHER): Payer: Self-pay | Admitting: Nurse Practitioner

## 2019-10-10 ENCOUNTER — Other Ambulatory Visit (INDEPENDENT_AMBULATORY_CARE_PROVIDER_SITE_OTHER): Payer: Self-pay

## 2019-10-10 DIAGNOSIS — F411 Generalized anxiety disorder: Secondary | ICD-10-CM

## 2019-10-10 MED FILL — ALPRAZolam 0.25 MG TABS: 0.25 | 30 days supply | Qty: 30 | Fill #0

## 2019-10-16 ENCOUNTER — Other Ambulatory Visit: Payer: Self-pay

## 2019-10-16 ENCOUNTER — Emergency Department (HOSPITAL_COMMUNITY): Payer: 59

## 2019-10-16 ENCOUNTER — Encounter (HOSPITAL_COMMUNITY): Payer: Self-pay | Admitting: Family Medicine

## 2019-10-16 ENCOUNTER — Observation Stay (HOSPITAL_COMMUNITY)
Admission: EM | Admit: 2019-10-16 | Discharge: 2019-10-17 | Disposition: A | Discharge status: Home / Self Care | Payer: 59 | Attending: Internal Medicine | Admitting: Internal Medicine

## 2019-10-16 DIAGNOSIS — I10 Essential (primary) hypertension: Secondary | ICD-10-CM | POA: Diagnosis not present

## 2019-10-16 DIAGNOSIS — F411 Generalized anxiety disorder: Secondary | ICD-10-CM | POA: Diagnosis not present

## 2019-10-16 DIAGNOSIS — J984 Other disorders of lung: Secondary | ICD-10-CM | POA: Insufficient documentation

## 2019-10-16 DIAGNOSIS — E669 Obesity, unspecified: Secondary | ICD-10-CM | POA: Diagnosis not present

## 2019-10-16 DIAGNOSIS — I2699 Other pulmonary embolism without acute cor pulmonale: Principal | ICD-10-CM

## 2019-10-16 DIAGNOSIS — E876 Hypokalemia: Secondary | ICD-10-CM

## 2019-10-16 DIAGNOSIS — F419 Anxiety disorder, unspecified: Secondary | ICD-10-CM | POA: Insufficient documentation

## 2019-10-16 DIAGNOSIS — Z8616 Personal history of COVID-19: Secondary | ICD-10-CM | POA: Insufficient documentation

## 2019-10-16 DIAGNOSIS — R0602 Shortness of breath: Secondary | ICD-10-CM | POA: Diagnosis not present

## 2019-10-16 DIAGNOSIS — Z79899 Other long term (current) drug therapy: Secondary | ICD-10-CM | POA: Insufficient documentation

## 2019-10-16 DIAGNOSIS — Z6831 Body mass index (BMI) 31.0-31.9, adult: Secondary | ICD-10-CM | POA: Diagnosis not present

## 2019-10-16 DIAGNOSIS — Z8249 Family history of ischemic heart disease and other diseases of the circulatory system: Secondary | ICD-10-CM | POA: Diagnosis not present

## 2019-10-16 DIAGNOSIS — Z79890 Hormone replacement therapy (postmenopausal): Secondary | ICD-10-CM | POA: Insufficient documentation

## 2019-10-16 DIAGNOSIS — Z87891 Personal history of nicotine dependence: Secondary | ICD-10-CM | POA: Insufficient documentation

## 2019-10-16 DIAGNOSIS — R000 Tachycardia, unspecified: Secondary | ICD-10-CM | POA: Diagnosis not present

## 2019-10-16 LAB — CBC WITH DIFFERENTIAL/PLATELET
Abs Immature Granulocytes: 0.05 10*3/uL (ref 0.00–0.07)
Basophils Absolute: 0 10*3/uL (ref 0.0–0.1)
Basophils Relative: 0 %
Eosinophils Absolute: 0.1 10*3/uL (ref 0.0–0.5)
Eosinophils Relative: 0 %
HCT: 38.4 % (ref 36.0–46.0)
Hemoglobin: 12.3 g/dL (ref 12.0–15.0)
Immature Granulocytes: 0 %
Lymphocytes Relative: 24 %
Lymphs Abs: 2.9 10*3/uL (ref 0.7–4.0)
MCH: 28.7 pg (ref 26.0–34.0)
MCHC: 32 g/dL (ref 30.0–36.0)
MCV: 89.7 fL (ref 80.0–100.0)
Monocytes Absolute: 1.3 10*3/uL — ABNORMAL HIGH (ref 0.1–1.0)
Monocytes Relative: 11 %
Neutro Abs: 7.8 10*3/uL — ABNORMAL HIGH (ref 1.7–7.7)
Neutrophils Relative %: 65 %
Platelets: 221 10*3/uL (ref 150–400)
RBC: 4.28 MIL/uL (ref 3.87–5.11)
RDW: 13.2 % (ref 11.5–15.5)
WBC: 12.1 10*3/uL — ABNORMAL HIGH (ref 4.0–10.5)
nRBC: 0 % (ref 0.0–0.2)

## 2019-10-16 LAB — BASIC METABOLIC PANEL
Anion gap: 13 (ref 5–15)
BUN: 8 mg/dL (ref 6–20)
CO2: 22 mmol/L (ref 22–32)
Calcium: 9.2 mg/dL (ref 8.9–10.3)
Chloride: 102 mmol/L (ref 98–111)
Creatinine, Ser: 0.83 mg/dL (ref 0.44–1.00)
GFR calc Af Amer: >60 mL/min (ref >60)
GFR calc non Af Amer: >60 mL/min (ref >60)
Glucose, Bld: 113 mg/dL — ABNORMAL HIGH (ref 70–99)
Potassium: 3 mmol/L — ABNORMAL LOW (ref 3.5–5.1)
Sodium: 137 mmol/L (ref 135–145)

## 2019-10-16 LAB — PROTIME-INR
INR: 1.1 (ref 0.8–1.2)
Prothrombin Time: 14.5 seconds (ref 11.4–15.2)

## 2019-10-16 LAB — TROPONIN I (HIGH SENSITIVITY): Troponin I (High Sensitivity): 4 ng/L (ref <18)

## 2019-10-16 LAB — APTT: aPTT: 33 seconds (ref 24–36)

## 2019-10-16 LAB — D-DIMER, QUANTITATIVE: D-Dimer, Quant: 3.59 ug/mL-FEU — ABNORMAL HIGH (ref 0.00–0.50)

## 2019-10-16 MED ORDER — SODIUM CHLORIDE 0.9 % IV BOLUS
500.0000 mL | Freq: Once | INTRAVENOUS | Status: AC
  Administered 2019-10-16: 500 mL via INTRAVENOUS

## 2019-10-16 MED ORDER — IOHEXOL 350 MG/ML SOLN
100.0000 mL | Freq: Once | INTRAVENOUS | Status: AC | PRN
  Administered 2019-10-16: 100 mL via INTRAVENOUS

## 2019-10-16 MED ORDER — HEPARIN BOLUS VIA INFUSION
5000.0000 [IU] | Freq: Once | INTRAVENOUS | Status: AC
  Administered 2019-10-16: 5000 [IU] via INTRAVENOUS
  Filled 2019-10-16: qty 5000

## 2019-10-16 MED ORDER — ACETAMINOPHEN 325 MG PO TABS
650.0000 mg | ORAL_TABLET | Freq: Once | ORAL | Status: AC | PRN
  Administered 2019-10-16: 21:00:00 650 mg via ORAL
  Filled 2019-10-16: qty 2

## 2019-10-16 MED ORDER — HEPARIN (PORCINE) 25000 UT/250ML-% IV SOLN
1300.0000 [IU]/h | INTRAVENOUS | Status: DC
  Administered 2019-10-16: 23:00:00 1300 [IU]/h via INTRAVENOUS
  Filled 2019-10-16: qty 250

## 2019-10-16 MED ORDER — SODIUM CHLORIDE (PF) 0.9 % IJ SOLN
INTRAMUSCULAR | Status: AC
  Filled 2019-10-16: qty 50

## 2019-10-16 NOTE — ED Provider Notes (Signed)
[facility] DEPT Provider Note   CSN: [id number] Arrival date & time: 10/16/19  1909     History Chief Complaint  Patient presents with  . SHOB  . COVID + 01/08    [name] is a 59 y.o. female.  She was diagnosed with Covid about 3 weeks ago.  She said she has been back to work after being out for about 10 days.  For about a week she has been having more shortness of breath and pain in her left chest when she takes a deep breath.  Having trouble lying down flat.  Cough minimally productive.  No recent fevers.  No hemoptysis.  No leg swelling.  The history is provided by the patient.  Shortness of Breath Severity:  Moderate Onset quality:  Gradual Duration:  1 week Timing:  Intermittent Progression:  Worsening Chronicity:  New Context: activity   Relieved by:  Nothing Worsened by:  Activity, deep breathing and coughing Ineffective treatments:  None tried Associated symptoms: chest pain and cough   Associated symptoms: no abdominal pain, no fever, no hemoptysis, no neck pain, no rash, no sore throat, no syncope and no vomiting   Risk factors: no hx of PE/DVT        Past Medical History:  Diagnosis Date  . Anxiety state, unspecified   . DEPRESSION   . Hypertension   . Obesity (BMI 35.0-39.9 without comorbidity) 05/16/2019  . PCOS (polycystic ovarian syndrome) 05/16/2019  . Sinus tachycardia 09/10/2015  . Tear of MCL (medial collateral ligament) of knee 11/2012   left knee  . Vitamin D deficiency disease 05/16/2019    Patient Active Problem List   Diagnosis Date Noted  . PCOS (polycystic ovarian syndrome) 05/16/2019  . Vitamin D deficiency disease 05/16/2019  . Obesity (BMI 35.0-39.9 without comorbidity) 05/16/2019  . Sinus tachycardia 09/10/2015  . Hyperglycemia 06/22/2015  . Posterior tibialis tendon insufficiency 04/16/2015  . Headache 07/30/2014  . Primary localized osteoarthrosis, lower leg 01/31/2014  . ALLERGIC RHINITIS  11/25/2010  . Essential hypertension 07/21/2010  . Anxiety state 05/19/2010  . DEPRESSION 05/19/2010    Past Surgical History:  Procedure Laterality Date  . COLONOSCOPY  2015  . DILATATION & CURRETTAGE/HYSTEROSCOPY WITH RESECTOCOPE  08-11-2004   removal polyps  . HAMMER TOE SURGERY Bilateral 03-22-2006   fifth toe  . KNEE ARTHROSCOPY WITH MEDIAL COLLATERAL LIGAMENT RECONSTRUCTION Left 12/18/2012   Procedure: KNEE ARTHROSCOPY WITH OPEN MEDIAL COLLATERAL LIGAMENT RECONSTRUCTION WITH ALLOGRAFT,ANTERIOR CRUCIATE LIGAMENT RESCONSTRUCTION WITH ALLOGRAFT, PARTIAL MEDIALMENISECTOMY/DEBRIDEMENT CHONDROPLASTY;  Surgeon: Sydnee Cabal, MD;  Location: [facility];  Service: Orthopedics;  Laterality: Left;     OB History   No obstetric history on file.     Family History  Problem Relation Age of Onset  . Hypertension Mother   . Cancer Mother        Kidney cancer  . Hypertension Father   . Diabetes Maternal Grandmother        amputation  . Colon cancer Neg Hx   . Rectal cancer Neg Hx   . Stomach cancer Neg Hx     Social History   Tobacco Use  . Smoking status: Former Smoker    Years: 6.00    Types: Cigarettes    Quit date: 12/12/2000    Years since quitting: 18.8  . Smokeless tobacco: Never Used  Substance Use Topics  . Alcohol use: Yes    Comment: White wine   . Drug use: No  Home Medications Prior to Admission medications   Medication Sig Start Date End Date Taking? Authorizing Provider  ALPRAZolam (XANAX) 0.25 MG tablet Take 1 tablet (0.25 mg total) by mouth at bedtime as needed for anxiety. 09/18/19   Doree Albee, MD  estradiol (ESTRACE) 1 MG tablet Take 1 mg by mouth daily.    [provider]  NP THYROID 120 MG tablet Take 1 tablet (120 mg total) by mouth daily. 06/12/19   Doree Albee, MD  olmesartan-hydrochlorothiazide (BENICAR HCT) 40-12.5 MG tablet Take 0.5 tablets by mouth daily. 09/18/19   Doree Albee, MD  progesterone  (PROMETRIUM) 200 MG capsule Take 2 capsules (400 mg total) by mouth daily at 12 noon. 06/13/19   Hurshel Party C, MD  TESTOSTERONE CYPIONATE IJ Inject 5 mg as directed every 7 (seven) days. Compounded    [provider]    Allergies    Oxycodone-acetaminophen  Review of Systems   Review of Systems  Constitutional: Negative for fever.  HENT: Negative for sore throat.   Eyes: Negative for visual disturbance.  Respiratory: Positive for cough and shortness of breath. Negative for hemoptysis.   Cardiovascular: Positive for chest pain. Negative for syncope.  Gastrointestinal: Negative for abdominal pain and vomiting.  Genitourinary: Negative for dysuria.  Musculoskeletal: Negative for neck pain.  Skin: Negative for rash.  Neurological: Negative for syncope.    Physical Exam Updated Vital Signs BP 113/76 (BP Location: Left Arm)   Pulse (!) 119   Temp 100.2 F (37.9 C) (Oral)   Resp (!) 28   Ht 5\' 7"  (1.702 m)   Wt 87.1 kg   LMP 06/18/2015   SpO2 96%   BMI 30.07 kg/m   Physical Exam Vitals and nursing note reviewed.  Constitutional:      General: She is not in acute distress.    Appearance: She is well-developed.  HENT:     Head: Normocephalic and atraumatic.  Eyes:     Conjunctiva/sclera: Conjunctivae normal.  Cardiovascular:     Rate and Rhythm: Regular rhythm. Tachycardia present.     Pulses: Normal pulses.     Heart sounds: No murmur.  Pulmonary:     Effort: Pulmonary effort is normal. No respiratory distress.     Breath sounds: Normal breath sounds.  Abdominal:     Palpations: Abdomen is soft.     Tenderness: There is no abdominal tenderness.  Musculoskeletal:        General: Normal range of motion.     Cervical back: Neck supple.     Right lower leg: No edema.     Left lower leg: No edema.  Skin:    General: Skin is warm and dry.     Capillary Refill: Capillary refill takes less than 2 seconds.  Neurological:     General: No focal deficit  present.     Mental Status: She is alert.     ED Results / Procedures / Treatments   Labs (all labs ordered are listed, but only abnormal results are displayed) Labs Reviewed  CBC WITH DIFFERENTIAL/PLATELET - Abnormal; Notable for the following components:      Result Value   WBC 12.1 (*)    Neutro Abs 7.8 (*)    Monocytes Absolute 1.3 (*)    All other components within normal limits  BASIC METABOLIC PANEL - Abnormal; Notable for the following components:   Potassium 3.0 (*)    Glucose, Bld 113 (*)    All other components within  normal limits  D-DIMER, QUANTITATIVE (NOT AT [facility]) - Abnormal; Notable for the following components:   D-Dimer, Quant 3.59 (*)    All other components within normal limits  HEPARIN LEVEL (UNFRACTIONATED) - Abnormal; Notable for the following components:   Heparin Unfractionated 0.75 (*)    All other components within normal limits  MAGNESIUM - Abnormal; Notable for the following components:   Magnesium 1.4 (*)    All other components within normal limits  BASIC METABOLIC PANEL - Abnormal; Notable for the following components:   Potassium 3.3 (*)    CO2 20 (*)    Calcium 8.7 (*)    All other components within normal limits  CBC WITH DIFFERENTIAL/PLATELET - Abnormal; Notable for the following components:   Hemoglobin 11.3 (*)    Monocytes Absolute 1.3 (*)    All other components within normal limits  MAGNESIUM - Abnormal; Notable for the following components:   Magnesium 1.4 (*)    All other components within normal limits  APTT  PROTIME-INR  PHOSPHORUS  HIV ANTIBODY (ROUTINE TESTING W REFLEX)  HEPARIN LEVEL (UNFRACTIONATED)  TROPONIN I (HIGH SENSITIVITY)  TROPONIN I (HIGH SENSITIVITY)    EKG None  Radiology DG Chest 2 View  Result Date: 10/16/2019 CLINICAL DATA:  59 year old female with shortness of breath. EXAM: CHEST - 2 VIEW COMPARISON:  Chest radiograph dated 01/03/2011. FINDINGS: There is shallow inspiration. Left lung base densities,  likely atelectasis. Infiltrate is less likely but not excluded. Clinical correlation is recommended. No pleural effusion or pneumothorax. The cardiac silhouette is within limits. No acute osseous pathology. IMPRESSION: Left lung base atelectasis versus less likely developing infiltrate. Clinical correlation is recommended. Electronically Signed   By: Anner Crete M.D.   On: 10/16/2019 20:49   CT Angio Chest PE W/Cm &/Or Wo Cm  Result Date: 10/16/2019 CLINICAL DATA:  Shortness of breath. EXAM: CT ANGIOGRAPHY CHEST WITH CONTRAST TECHNIQUE: Multidetector CT imaging of the chest was performed using the standard protocol during bolus administration of intravenous contrast. Multiplanar CT image reconstructions and MIPs were obtained to evaluate the vascular anatomy. CONTRAST:  136mL OMNIPAQUE IOHEXOL 350 MG/ML SOLN COMPARISON:  None. FINDINGS: Cardiovascular: Marked severity pulmonary embolism is seen involving numerous upper lobe and lower lobe branches of the left pulmonary artery. A mild amount of pulmonary embolism is also seen within a middle lobe branch of the right pulmonary artery. No saddle embolus is identified. There is no evidence of associated heart strain. Normal heart size. No pericardial effusion. Mediastinum/Nodes: No enlarged mediastinal, hilar, or axillary lymph nodes. Thyroid gland, trachea, and esophagus demonstrate no significant findings. Lungs/Pleura: Moderate to marked severity patchy infiltrate is seen within the left lower lobe. There is no evidence of a pleural effusion or pneumothorax. Upper Abdomen: Noninflamed diverticula are seen throughout the large bowel. Musculoskeletal: No chest wall abnormality. No acute or significant osseous findings. Review of the MIP images confirms the above findings. IMPRESSION: 1. Marked severity bilateral pulmonary embolism involving predominantly the upper lobe and lower lobe branches of the left pulmonary artery. 2. Moderate to marked severity left  lower lobe infiltrate. 3. Colonic diverticulosis. Electronically Signed   By: Virgina Norfolk M.D.   On: 10/16/2019 22:26    Procedures .Critical Care Performed by: Hayden Rasmussen, MD Authorized by: Hayden Rasmussen, MD   Critical care provider statement:    Critical care time (minutes):  45   Critical care time was exclusive of:  Separately billable procedures and treating other patients   Critical care  was necessary to treat or prevent imminent or life-threatening deterioration of the following conditions:  Respiratory failure   Critical care was time spent personally by me on the following activities:  Discussions with consultants, evaluation of patient's response to treatment, examination of patient, ordering and performing treatments and interventions, ordering and review of laboratory studies, ordering and review of radiographic studies, pulse oximetry, re-evaluation of patient's condition, obtaining history from patient or surrogate, review of old charts and development of treatment plan with patient or surrogate   I assumed direction of critical care for this patient from another provider in my specialty: no     (including critical care time)  Medications Ordered in ED Medications  0.9 %  sodium chloride infusion ( Intravenous New Bag/Given 10/17/19 0332)  potassium chloride 10 mEq in 100 mL IVPB (10 mEq Intravenous New Bag/Given 10/17/19 0450)  thyroid (ARMOUR) tablet 120 mg (120 mg Oral Given 10/17/19 0948)  sodium chloride flush (NS) 0.9 % injection 3 mL (3 mLs Intravenous Given 10/17/19 0242)  acetaminophen (TYLENOL) tablet 650 mg (has no administration in time range)    Or  acetaminophen (TYLENOL) suppository 650 mg (has no administration in time range)  albuterol (PROVENTIL) (2.5 MG/3ML) 0.083% nebulizer solution 3 mL (has no administration in time range)  traMADol (ULTRAM) tablet 50 mg (50 mg Oral Given 10/17/19 0518)  heparin ADULT infusion 100 units/mL (25000 units/245mL  sodium chloride 0.45%) (1,250 Units/hr Intravenous Rate/Dose Change 10/17/19 0603)  magnesium oxide (MAG-OX) tablet 400 mg (400 mg Oral Given 10/17/19 0947)  ketorolac (TORADOL) 15 MG/ML injection 15 mg (has no administration in time range)  acetaminophen (TYLENOL) tablet 650 mg (650 mg Oral Given 10/16/19 2059)  sodium chloride 0.9 % bolus 500 mL (0 mLs Intravenous Stopped 10/16/19 2217)  iohexol (OMNIPAQUE) 350 MG/ML injection 100 mL (100 mLs Intravenous Contrast Given 10/16/19 2212)  sodium chloride (PF) 0.9 % injection (  Given by Other 10/16/19 2217)  heparin bolus via infusion 5,000 Units (5,000 Units Intravenous Bolus from Bag 10/16/19 2310)    ED Course  I have reviewed the triage vital signs and the nursing notes.  Pertinent labs & imaging results that were available during my care of the patient were reviewed by me and considered in my medical decision making (see chart for details).  Clinical Course as of Oct 15 2248  Wed Oct 16, 2019  2052 Differential includes ACS, PE, pneumothorax, pneumonia, musculoskeletal, reflux   [MB]  2116 EKG shows sinus tachycardia rate of 121 abnormal R wave progression borderline QTC prolongation at 491.  Nonspecific ST-T's.   [MB]  2245 Received a call from radiology that patient has a large left-sided PE with some on the right but not saddle embolus and no signs of right heart strain.  She also has some pneumonia changes.  I reviewed this with the patient and have ordered her heparin per pharmacy protocol.  Hospitalist has been paged.   [MB]  2249 Discussed with Dr. Velia Meyer [facility] hospitalist who accepts the patient for admission.  He is recommending holding off on antibiotics for now and just treating the PE.   [MB]    Clinical Course User Index [MB] Hayden Rasmussen, MD   MDM Rules/Calculators/A&P                       Final Clinical Impression(s) / ED Diagnoses Final diagnoses:  Acute pulmonary embolism without acute cor pulmonale, unspecified  pulmonary embolism type ([place])  Rx / DC Orders ED Discharge Orders    None       Hayden Rasmussen, MD 10/17/19 1003

## 2019-10-16 NOTE — ED Triage Notes (Signed)
Patient states she was diagnosed with COVID on September 20, 2019. Since, she has been experiencing shortness of breath and unable to take a deep breath. When lying down, she has more difficulty. Patient is ambulatory from triage on acute distress.

## 2019-10-16 NOTE — Progress Notes (Signed)
ANTICOAGULATION CONSULT NOTE - Initial Consult  Pharmacy Consult for IV heparin Indication: pulmonary embolus  Allergies  Allergen Reactions  . Oxycodone-Acetaminophen Itching    TYLOX ALSO    Patient Measurements: Height: 5\' 7"  (170.2 cm) Weight: 192 lb (87.1 kg) IBW/kg (Calculated) : 61.6 Heparin Dosing Weight: 70 kg  Vital Signs: Temp: 100.2 F (37.9 C) (02/03 2026) Temp Source: Oral (02/03 2026) BP: 107/65 (02/03 2230) Pulse Rate: 98 (02/03 2230)  Labs: Recent Labs    10/16/19 2049  HGB 12.3  HCT 38.4  PLT 221  CREATININE 0.83  TROPONINIHS 4    Estimated Creatinine Clearance: 83.7 mL/min (by C-G formula based on SCr of 0.83 mg/dL).   Medical History: Past Medical History:  Diagnosis Date  . Anxiety state, unspecified   . DEPRESSION   . Hypertension   . Obesity (BMI 35.0-39.9 without comorbidity) 05/16/2019  . PCOS (polycystic ovarian syndrome) 05/16/2019  . Sinus tachycardia 09/10/2015  . Tear of MCL (medial collateral ligament) of knee 11/2012   left knee  . Vitamin D deficiency disease 05/16/2019    Medications:  Scheduled:  Infusions:   Assessment: 63 yoF Covid + 1/7, now with increasing SOB. CT shows biltateral PE no heart strain.  Baseline labs: INR = 1.1, aptt = 33,  H/H plts WNL  Goal of Therapy:  Heparin level 0.3-0.7 units/ml Monitor platelets by anticoagulation protocol: Yes    Plan:  Baseline INR, aptt STAT Heparin 5000 unit bolus x1 Start drip at 1300 units/hr Daily CBC/HL Check 1st HL in 6 hours  Dorrene German 10/16/2019,10:52 PM

## 2019-10-16 NOTE — H&P (Signed)
History and Physical    PLEASE NOTE THAT DRAGON DICTATION SOFTWARE WAS USED IN THE CONSTRUCTION OF THIS NOTE.   [name] [id number] DOB: October 25, 1960 DOA: 10/16/2019  PCP: Doree Albee, MD Patient coming from: home   I have personally briefly reviewed patient's old medical records in [place]  Chief Complaint: chest pain  HPI: [name] is a 59 y.o. female with medical history significant for htn, anxiety, recent COVID-19 infection, who is admitted to [facility] on 10/16/2019 with acute left-sided pulmonary embolism after presenting from home to [facility] Emergency Department complaining of chest pain.   The patient reports 4-5 days of left-side chest pain which worsens with deep inspiration as well as non-productive cough that patient has been experiencing in the absence of hemoptysis over a similar time-frame. Associated with mild SOB in the absence of any discrete orthopnea, PND, or peripheral edema.  Also denies any associated calf tenderness or leg erythema.  Chest discomfort is nonexertional and is not reproducible with palpation of the left side of the anterior chest wall.  Denies any recent trauma or surgical procedures.  Denies any personal or family history of prior DVT/PE.  No personal history of malignancy.  However, the patient reports that she was diagnosed with COVID-19 on 09/20/2019, and that her associated respiratory symptoms resolved rendering her completely asymptomatic before development of the above presenting left-sided chest pain with associated nonproductive cough and mild shortness of breath.  She denies any associated subjective fever, chills, rigors, generalized myalgias.  Not on any antiplatelet or anticoagulant medications at home.  No reported history of gastrointestinal bleed.  Over the last week, the patient denies any headache, neck stiffness, rhinitis, rhinorrhea, sore throat, nausea, vomiting, abdominal pain,  diarrhea, or rash.  She also denies any recent dysuria, gross hematuria, or change in urinary urgency/frequency.    ED Course:  Vital signs in the ED were notable for the following: Temperature max 100.2; initial heart rate noted to be 108, which subsequently decreased into the 80s.  Blood pressure ranged from 104/60 9-1 13/76; respiratory rate 19-26, and oxygen saturation 96 to 100% on room air.  Labs were notable for the following: BMP notable for sodium 137, potassium 3.0, creatinine 0.83.  Initial high-sensitivity troponin I found to be 4, with repeat value demonstrating slight trend down to 3.  D-dimer 3.59.  CBC notable for white blood cell count of 12,100, hemoglobin 12.3.  INR 1.1.  Chest x-ray showed evidence of left lower lobe airspace opacity suggestive of atelectasis in the absence of any evidence of pulmonary edema or pneumothorax.  CTA of the chest showed a large left-sided pulmonary embolism but no evidence of saddle embolus or evidence of right-sided heart strain  While in the ED, the following were administered: Acetaminophen 650 mg p.o. x1.  IV normal saline x500 cc bolus.  Heparin bolus followed by initiation of heparin drip.  Subsequently, the patient was admitted for overnight observation to the med telemetry floor for further evaluation and management of presenting acute pulmonary embolism, including monitoring for development of hypotension versus acute hypoxia, with need for additional planning regarding transition to [place] for treatment of such.    Review of Systems: As per HPI otherwise 10 point review of systems negative.   Past Medical History:  Diagnosis Date  . Anxiety state, unspecified   . DEPRESSION   . Hypertension   . Obesity (BMI 35.0-39.9 without comorbidity) 05/16/2019  . PCOS (polycystic ovarian syndrome)  05/16/2019  . Sinus tachycardia 09/10/2015  . Tear of MCL (medial collateral ligament) of knee 11/2012   left knee  . Vitamin D deficiency disease  05/16/2019    Past Surgical History:  Procedure Laterality Date  . COLONOSCOPY  2015  . DILATATION & CURRETTAGE/HYSTEROSCOPY WITH RESECTOCOPE  08-11-2004   removal polyps  . HAMMER TOE SURGERY Bilateral 03-22-2006   fifth toe  . KNEE ARTHROSCOPY WITH MEDIAL COLLATERAL LIGAMENT RECONSTRUCTION Left 12/18/2012   Procedure: KNEE ARTHROSCOPY WITH OPEN MEDIAL COLLATERAL LIGAMENT RECONSTRUCTION WITH ALLOGRAFT,ANTERIOR CRUCIATE LIGAMENT RESCONSTRUCTION WITH ALLOGRAFT, PARTIAL MEDIALMENISECTOMY/DEBRIDEMENT CHONDROPLASTY;  Surgeon: Sydnee Cabal, MD;  Location: [facility];  Service: Orthopedics;  Laterality: Left;    Social History:  reports that she quit smoking about 18 years ago. Her smoking use included cigarettes. She quit after 6.00 years of use. She has never used smokeless tobacco. She reports current alcohol use. She reports that she does not use drugs.   Allergies  Allergen Reactions  . Oxycodone-Acetaminophen Itching    TYLOX ALSO    Family History  Problem Relation Age of Onset  . Hypertension Mother   . Cancer Mother        Kidney cancer  . Hypertension Father   . Diabetes Maternal Grandmother        amputation  . Colon cancer Neg Hx   . Rectal cancer Neg Hx   . Stomach cancer Neg Hx      Prior to Admission medications   Medication Sig Start Date End Date Taking? Authorizing Provider  ALPRAZolam (XANAX) 0.25 MG tablet Take 1 tablet (0.25 mg total) by mouth at bedtime as needed for anxiety. 09/18/19  Yes Gosrani, Nimish C, MD  estradiol (ESTRACE) 1 MG tablet Take 1 mg by mouth daily.   Yes [provider]  NP THYROID 120 MG tablet Take 1 tablet (120 mg total) by mouth daily. 06/12/19  Yes Gosrani, Nimish C, MD  olmesartan-hydrochlorothiazide (BENICAR HCT) 40-12.5 MG tablet Take 0.5 tablets by mouth daily. 09/18/19  Yes Gosrani, Nimish C, MD  progesterone (PROMETRIUM) 200 MG capsule Take 2 capsules (400 mg total) by mouth daily at 12 noon. 06/13/19  Yes  Gosrani, Nimish C, MD  TESTOSTERONE CYPIONATE IJ Inject 5 mg as directed every 7 (seven) days. Compounded   Yes [provider]     Objective    Physical Exam: Vitals:   10/16/19 2026 10/16/19 2130 10/16/19 2200 10/16/19 2230  BP: 113/76 100/68 105/60 107/65  Pulse: (!) 119 (!) 108 (!) 103 98  Resp: (!) 28 (!) 29 (!) 26 (!) 23  Temp: 100.2 F (37.9 C)     TempSrc: Oral     SpO2: 96% 96% 94% 98%  Weight:      Height:        General: appears to be stated age; alert, oriented Skin: warm, dry, no rash Head:  AT/[place] Eyes:  PEARL b/l, EOMI Mouth:  Oral mucosa membranes appear moist, normal dentition Neck: supple; trachea midline Heart:  RRR; did not appreciate any M/R/G Lungs: CTAB, did not appreciate any wheezes, rales, or rhonchi Abdomen: + BS; soft, ND, NT Vascular: 2+ pedal pulses b/l; 2+ radial pulses b/l Extremities: no peripheral edema, no muscle wasting   Labs on Admission: I have personally reviewed following labs and imaging studies  CBC: Recent Labs  Lab 10/16/19 2049  WBC 12.1*  NEUTROABS 7.8*  HGB 12.3  HCT 38.4  MCV 89.7  PLT [id number]   Basic Metabolic Panel: Recent  Labs  Lab 10/16/19 2049  NA 137  K 3.0*  CL 102  CO2 22  GLUCOSE 113*  BUN 8  CREATININE 0.83  CALCIUM 9.2   GFR: Estimated Creatinine Clearance: 83.7 mL/min (by C-G formula based on SCr of 0.83 mg/dL). Liver Function Tests: No results for input(s): AST, ALT, ALKPHOS, BILITOT, PROT, ALBUMIN in the last 168 hours. No results for input(s): LIPASE, AMYLASE in the last 168 hours. No results for input(s): AMMONIA in the last 168 hours. Coagulation Profile: No results for input(s): INR, PROTIME in the last 168 hours. Cardiac Enzymes: No results for input(s): CKTOTAL, CKMB, CKMBINDEX, TROPONINI in the last 168 hours. BNP (last 3 results) No results for input(s): PROBNP in the last 8760 hours. HbA1C: No results for input(s): HGBA1C in the last 72 hours. CBG: No results for  input(s): GLUCAP in the last 168 hours. Lipid Profile: No results for input(s): CHOL, HDL, LDLCALC, TRIG, CHOLHDL, LDLDIRECT in the last 72 hours. Thyroid Function Tests: No results for input(s): TSH, T4TOTAL, FREET4, T3FREE, THYROIDAB in the last 72 hours. Anemia Panel: No results for input(s): VITAMINB12, FOLATE, FERRITIN, TIBC, IRON, RETICCTPCT in the last 72 hours. Urine analysis:    Component Value Date/Time   COLORURINE LT. YELLOW 10/26/2011 0902   APPEARANCEUR CLEAR 10/26/2011 0902   LABSPEC 1.025 10/26/2011 0902   PHURINE 6.0 10/26/2011 0902   GLUCOSEU NEGATIVE 10/26/2011 0902   HGBUR MODERATE 10/26/2011 0902   BILIRUBINUR NEGATIVE 10/26/2011 0902   KETONESUR NEGATIVE 10/26/2011 0902   UROBILINOGEN 0.2 10/26/2011 0902   NITRITE NEGATIVE 10/26/2011 0902   LEUKOCYTESUR NEGATIVE 10/26/2011 0902    Radiological Exams on Admission: DG Chest 2 View  Result Date: 10/16/2019 CLINICAL DATA:  59 year old female with shortness of breath. EXAM: CHEST - 2 VIEW COMPARISON:  Chest radiograph dated 01/03/2011. FINDINGS: There is shallow inspiration. Left lung base densities, likely atelectasis. Infiltrate is less likely but not excluded. Clinical correlation is recommended. No pleural effusion or pneumothorax. The cardiac silhouette is within limits. No acute osseous pathology. IMPRESSION: Left lung base atelectasis versus less likely developing infiltrate. Clinical correlation is recommended. Electronically Signed   By: Anner Crete M.D.   On: 10/16/2019 20:49   CT Angio Chest PE W/Cm &/Or Wo Cm  Result Date: 10/16/2019 CLINICAL DATA:  Shortness of breath. EXAM: CT ANGIOGRAPHY CHEST WITH CONTRAST TECHNIQUE: Multidetector CT imaging of the chest was performed using the standard protocol during bolus administration of intravenous contrast. Multiplanar CT image reconstructions and MIPs were obtained to evaluate the vascular anatomy. CONTRAST:  175mL OMNIPAQUE IOHEXOL 350 MG/ML SOLN COMPARISON:   None. FINDINGS: Cardiovascular: Marked severity pulmonary embolism is seen involving numerous upper lobe and lower lobe branches of the left pulmonary artery. A mild amount of pulmonary embolism is also seen within a middle lobe branch of the right pulmonary artery. No saddle embolus is identified. There is no evidence of associated heart strain. Normal heart size. No pericardial effusion. Mediastinum/Nodes: No enlarged mediastinal, hilar, or axillary lymph nodes. Thyroid gland, trachea, and esophagus demonstrate no significant findings. Lungs/Pleura: Moderate to marked severity patchy infiltrate is seen within the left lower lobe. There is no evidence of a pleural effusion or pneumothorax. Upper Abdomen: Noninflamed diverticula are seen throughout the large bowel. Musculoskeletal: No chest wall abnormality. No acute or significant osseous findings. Review of the MIP images confirms the above findings. IMPRESSION: 1. Marked severity bilateral pulmonary embolism involving predominantly the upper lobe and lower lobe branches of the left pulmonary artery. 2. Moderate  to marked severity left lower lobe infiltrate. 3. Colonic diverticulosis. Electronically Signed   By: Virgina Norfolk M.D.   On: 10/16/2019 22:26     Assessment/Plan   [name] is a 59 y.o. female with medical history significant for htn, anxiety, recent COVID-19 infection, who is admitted to [facility] on 10/16/2019 with acute left-sided pulmonary embolism after presenting from home to [facility] Emergency Department complaining of chest pain.    Principal Problem:   Acute pulmonary embolism (HCC) Active Problems:   Anxiety state   Essential hypertension   Hypokalemia    #) Acute Pulmonary Embolism: In the context of presenting left pleuritic chest pain associated with shortness breath, mildly productive cough, with CTA chest demonstrating large left-sided PE without evidence of s acute pulmonary embolism  involving the right upper lobe. No evidence of associated cor pulmonale or hypoxia. No evidence of associated hypotension to warrant consideration for TPA administration. Appears to be patient's first PE/DVT, and appears to be provoked given recent COVID-19 with its associated prothrombotic state. In setting of provoked first-time PE, anticipate 3-6 months of anticoagulation. There is limited utility in obtaining venous Dopplers of the bilateral lower extremities as half of all pulmonary emboli occur in the absence of overtly identifiable DVTs. Started on Heparin drip in the ED this evening, with anticipation that this will be transitioned to a DOAC prior to discharge.     Plan: Heparin drip overnight.. Anticipate conversion to an oral anticoagulant in the morning, will additional discussions regarding the risks versus benefits of the various available oral anticoagulant options. Monitor on telemetry. Monitor for development of hypotension. Prn supplemental oxygen order to maintain oxygen saturations greater than or equal to 92%. Prn acetaminophen. Continuous pulse ox. Check INR. Recheck CBC in the morning. Incentive spirometry. Will hold home anti-hypertensive medications for now in setting of close monitoring for development of hypotension. IVF's overnight for support pre-load dependent nature of acute PE.     #) Hypokalemia: presenting potassium noted to be 3.0.   Plan: KCl 40 mEq IV over 4 hours x 1 now. Add-on serum Mg level. Repeat BMP in the morning. Monitor on telemetry.       #) History of essential hypertension: home antihypertensive regimen consists of olmesartan and HCTZ.Marland Kitchen Systolic blood pressures in the ED  today noted to be in the low 100's mmHg.    Plan: Close monitoring of subsequent blood pressure via routine vital signs, particular for development of hypotension in setting of presenting large left-sided PE. Hold home anti-hypertensives for now, as above.      #) Anxiety: on  prn Xanax at home. Doesn't appear to be on a scheduled SSRI or SNRI.   Plan: will hold home Xanax for now.       #) Recent COVID-19 infxn: dx'ed with COVID-19 on 09/20/19. Positive COVID finding occurred within the last 3 months, but greater than 21 days ago, there is no indication for continuation of isolation, and no benefit in repeating COVID-19 testing at this time given high likehood of residual positive finding. Additionally, given that patient respiratory symptoms initially improved outside of 21 day window following initial dx before development of these evening's presenting pleuritic CP, sob, and cough, alternate dx was suspected, and verified with identification of aforementioned PE. Clinically, less suspicious for bacterial pna at this time, although this is a possibility given sequence of symptoms following recent COVID-19 dx.   Plan: discontinue isolation as above. Refrain from retesting for COVID  at this time, as above. Repeat CBC in the morning.     DVT prophylaxis: on Heparin drip, as aboave. Code Status: full Family Communication: none Disposition Plan: Per Rounding Team Consults called: none  Admission status: observation; med-tele.    PLEASE NOTE THAT DRAGON DICTATION SOFTWARE WAS USED IN THE CONSTRUCTION OF THIS NOTE.   [place] [facility] Pager [phone] From [place].   Otherwise, please contact night-coverage  [email or web address] Password [facility]  10/16/2019, 10:51 PM

## 2019-10-17 ENCOUNTER — Encounter (HOSPITAL_COMMUNITY): Payer: Self-pay | Admitting: Internal Medicine

## 2019-10-17 DIAGNOSIS — F419 Anxiety disorder, unspecified: Secondary | ICD-10-CM | POA: Diagnosis not present

## 2019-10-17 DIAGNOSIS — E876 Hypokalemia: Secondary | ICD-10-CM | POA: Diagnosis not present

## 2019-10-17 DIAGNOSIS — Z79899 Other long term (current) drug therapy: Secondary | ICD-10-CM | POA: Diagnosis not present

## 2019-10-17 DIAGNOSIS — Z79890 Hormone replacement therapy (postmenopausal): Secondary | ICD-10-CM | POA: Diagnosis not present

## 2019-10-17 DIAGNOSIS — J984 Other disorders of lung: Secondary | ICD-10-CM | POA: Diagnosis not present

## 2019-10-17 DIAGNOSIS — I2699 Other pulmonary embolism without acute cor pulmonale: Secondary | ICD-10-CM | POA: Diagnosis not present

## 2019-10-17 DIAGNOSIS — Z8616 Personal history of COVID-19: Secondary | ICD-10-CM | POA: Diagnosis not present

## 2019-10-17 DIAGNOSIS — I10 Essential (primary) hypertension: Secondary | ICD-10-CM | POA: Diagnosis not present

## 2019-10-17 LAB — CBC WITH DIFFERENTIAL/PLATELET
Abs Immature Granulocytes: 0.04 10*3/uL (ref 0.00–0.07)
Basophils Absolute: 0 10*3/uL (ref 0.0–0.1)
Basophils Relative: 0 %
Eosinophils Absolute: 0.1 10*3/uL (ref 0.0–0.5)
Eosinophils Relative: 1 %
HCT: 37 % (ref 36.0–46.0)
Hemoglobin: 11.3 g/dL — ABNORMAL LOW (ref 12.0–15.0)
Immature Granulocytes: 0 %
Lymphocytes Relative: 32 %
Lymphs Abs: 3.3 10*3/uL (ref 0.7–4.0)
MCH: 28.1 pg (ref 26.0–34.0)
MCHC: 30.5 g/dL (ref 30.0–36.0)
MCV: 92 fL (ref 80.0–100.0)
Monocytes Absolute: 1.3 10*3/uL — ABNORMAL HIGH (ref 0.1–1.0)
Monocytes Relative: 12 %
Neutro Abs: 5.7 10*3/uL (ref 1.7–7.7)
Neutrophils Relative %: 55 %
Platelets: 209 10*3/uL (ref 150–400)
RBC: 4.02 MIL/uL (ref 3.87–5.11)
RDW: 13.2 % (ref 11.5–15.5)
WBC: 10.5 10*3/uL (ref 4.0–10.5)
nRBC: 0 % (ref 0.0–0.2)

## 2019-10-17 LAB — MAGNESIUM
Magnesium: 1.4 mg/dL — ABNORMAL LOW (ref 1.7–2.4)
Magnesium: 1.4 mg/dL — ABNORMAL LOW (ref 1.7–2.4)

## 2019-10-17 LAB — HEPARIN LEVEL (UNFRACTIONATED): Heparin Unfractionated: 0.75 IU/mL — ABNORMAL HIGH (ref 0.30–0.70)

## 2019-10-17 LAB — BASIC METABOLIC PANEL
Anion gap: 14 (ref 5–15)
BUN: 8 mg/dL (ref 6–20)
CO2: 20 mmol/L — ABNORMAL LOW (ref 22–32)
Calcium: 8.7 mg/dL — ABNORMAL LOW (ref 8.9–10.3)
Chloride: 104 mmol/L (ref 98–111)
Creatinine, Ser: 0.66 mg/dL (ref 0.44–1.00)
GFR calc Af Amer: >60 mL/min (ref >60)
GFR calc non Af Amer: >60 mL/min (ref >60)
Glucose, Bld: 97 mg/dL (ref 70–99)
Potassium: 3.3 mmol/L — ABNORMAL LOW (ref 3.5–5.1)
Sodium: 138 mmol/L (ref 135–145)

## 2019-10-17 LAB — PHOSPHORUS: Phosphorus: 2.6 mg/dL (ref 2.5–4.6)

## 2019-10-17 LAB — HIV ANTIBODY (ROUTINE TESTING W REFLEX): HIV Screen 4th Generation wRfx: NONREACTIVE

## 2019-10-17 LAB — TROPONIN I (HIGH SENSITIVITY): Troponin I (High Sensitivity): 3 ng/L (ref <18)

## 2019-10-17 MED ORDER — ALBUTEROL SULFATE (2.5 MG/3ML) 0.083% IN NEBU: 3.0000 mL | INHALATION_SOLUTION | RESPIRATORY_TRACT | Status: DC | PRN

## 2019-10-17 MED ORDER — APIXABAN 5 MG PO TABS: 5.0000 mg | ORAL_TABLET | Freq: Two times a day (BID) | ORAL | Status: DC

## 2019-10-17 MED ORDER — MAGNESIUM SULFATE 4 GM/100ML IV SOLN
4.0000 g | Freq: Once | INTRAVENOUS | Status: DC
  Filled 2019-10-17: qty 100

## 2019-10-17 MED ORDER — HEPARIN (PORCINE) 25000 UT/250ML-% IV SOLN: 1250.0000 [IU]/h | INTRAVENOUS | Status: AC

## 2019-10-17 MED ORDER — POTASSIUM CHLORIDE 10 MEQ/100ML IV SOLN
10.0000 meq | INTRAVENOUS | Status: AC
  Administered 2019-10-17 (×2): 10 meq via INTRAVENOUS
  Filled 2019-10-17 (×4): qty 100

## 2019-10-17 MED ORDER — APIXABAN 5 MG PO TABS
10.0000 mg | ORAL_TABLET | Freq: Two times a day (BID) | ORAL | Status: DC
  Administered 2019-10-17: 10 mg via ORAL
  Filled 2019-10-17: qty 4

## 2019-10-17 MED ORDER — TRAMADOL HCL 50 MG PO TABS
50.0000 mg | ORAL_TABLET | Freq: Four times a day (QID) | ORAL | Status: DC | PRN
  Administered 2019-10-17: 05:00:00 50 mg via ORAL
  Filled 2019-10-17: qty 1

## 2019-10-17 MED ORDER — THYROID 120 MG PO TABS
120.0000 mg | ORAL_TABLET | Freq: Every day | ORAL | Status: DC
  Administered 2019-10-17: 120 mg via ORAL
  Filled 2019-10-17: qty 1

## 2019-10-17 MED ORDER — ACETAMINOPHEN 325 MG PO TABS: 650.0000 mg | ORAL_TABLET | Freq: Four times a day (QID) | ORAL | Status: DC | PRN

## 2019-10-17 MED ORDER — MAGNESIUM OXIDE 400 (241.3 MG) MG PO TABS
400.0000 mg | ORAL_TABLET | Freq: Two times a day (BID) | ORAL | Status: DC
  Administered 2019-10-17: 400 mg via ORAL
  Filled 2019-10-17: qty 1

## 2019-10-17 MED ORDER — ACETAMINOPHEN 650 MG RE SUPP: 650.0000 mg | Freq: Four times a day (QID) | RECTAL | Status: DC | PRN

## 2019-10-17 MED ORDER — APIXABAN 5 MG PO TABS: 5.0000 mg | ORAL_TABLET | Freq: Two times a day (BID) | ORAL | 0 refills | Status: DC

## 2019-10-17 MED ORDER — MAGNESIUM OXIDE 400 (241.3 MG) MG PO TABS: 400.0000 mg | ORAL_TABLET | Freq: Two times a day (BID) | ORAL | 0 refills | Status: AC

## 2019-10-17 MED ORDER — SODIUM CHLORIDE 0.9% FLUSH
3.0000 mL | Freq: Two times a day (BID) | INTRAVENOUS | Status: DC
  Administered 2019-10-17: 3 mL via INTRAVENOUS

## 2019-10-17 MED ORDER — APIXABAN 5 MG PO TABS: 10.0000 mg | ORAL_TABLET | Freq: Two times a day (BID) | ORAL | 0 refills | Status: DC

## 2019-10-17 MED ORDER — IBUPROFEN 600 MG PO TABS: 600.0000 mg | ORAL_TABLET | Freq: Four times a day (QID) | ORAL | 0 refills | Status: AC | PRN

## 2019-10-17 MED ORDER — KETOROLAC TROMETHAMINE 15 MG/ML IJ SOLN
15.0000 mg | Freq: Once | INTRAMUSCULAR | Status: AC
  Administered 2019-10-17: 15 mg via INTRAVENOUS
  Filled 2019-10-17: qty 1

## 2019-10-17 MED ORDER — SODIUM CHLORIDE 0.9 % IV SOLN: INTRAVENOUS | Status: DC

## 2019-10-17 MED FILL — ELIQUIS 5 MG TABLET: 5 | 30 days supply | Qty: 60 | Fill #0

## 2019-10-17 NOTE — Progress Notes (Signed)
Initial Nutrition Assessment  DOCUMENTATION CODES:   Obesity unspecified  INTERVENTION:  Ensure Enlive po daily, each supplement provides 350 kcal and 20 grams of protein  MVI with minerals daily  NUTRITION DIAGNOSIS:   Inadequate oral intake related to decreased appetite, acute illness(acute left-sided pulmonary embolism) as evidenced by per patient/family report, energy intake < or equal to 50% for > or equal to 5 days.    GOAL:   Patient will meet greater than or equal to 90% of their needs    MONITOR:   PO intake, Supplement acceptance, Labs, Weight trends, I & O's  REASON FOR ASSESSMENT:   Malnutrition Screening Tool    ASSESSMENT:  59 year female with past medical history significant for HTN, anxiety, diagnosed with COVID-19 on 1/8 presented with 4-5 day history of left-side chest pain, mild SOB, and non-productive cough and admitted with acute left-sided pulmonary embolism.  Patient reports that she is feeling better and waiting on discharge paperwork this afternoon at RD visit. Lunch tray remained on bedside table, pt reports that she did not care for her meal. She endorses improvement to appetite and stated that she is ready for eating some chicken pot pie tonight for dinner. She endorses poor appetite over the past week and decreased po intake prior to admission, recalls only drinking water and juice for the last 3-4 days. Patient reports usual intake of 2 meals/day. Occasionally, she will have a couple pieces of bacon with coffee for breakfast, recalls salads and sandwiches for lunch, and reports eating a balanced dinner.   RD encouraged intake of nutrition supplement as needed with decreased oral intake. Patient amenable to strawberry Ensure during admission.  UBW 192 lbs per pt report Current wt 201.08 lbs Limited recent wt history for review, on 05/16/19 pt wt 96.5 kg (212.3 lbs). Patient has lost 11.22 lbs over the past 5 months which is insignificant for time  frame.  Medications reviewed  Labs: BG 97,113, K 3.3 (L), Mg 1.4 (L) PO4 (WNL)  NUTRITION - FOCUSED PHYSICAL EXAM: Deferred   Diet Order:   Diet Order            Diet regular Room service appropriate? Yes; Fluid consistency: Thin  Diet effective now        Diet - low sodium heart healthy              EDUCATION NEEDS:   Education needs have been addressed  Skin:  Skin Assessment: Reviewed RN Assessment  Last BM:  2/1  Height:   Ht Readings from Last 1 Encounters:  10/17/19 5\' 7"  (1.702 m)    Weight:   Wt Readings from Last 1 Encounters:  10/17/19 91.4 kg    Ideal Body Weight:  61.4 kg  BMI:  Body mass index is 31.58 kg/m.  Estimated Nutritional Needs:   Kcal:  1800-2000  Protein:  90-100  Fluid:  >/= 1.8 L/day    Lajuan Lines, RD, LDN Clinical Nutrition Office Telephone [phone] After Hours/Weekend Pager: [phone]

## 2019-10-17 NOTE — Discharge Summary (Signed)
Physician Discharge Summary  [name] [id number] DOB: 1961/04/12 DOA: 10/16/2019  PCP: Doree Albee, MD  Admit date: 10/16/2019 Discharge date: 10/17/2019   Code Status: Full Code  Admitted From: Home Discharged to: [place]: None Equipment/Devices: None Discharge Condition: Improving  Recommendations for Outpatient Follow-up   1. Follow up with PCP in 1 week 2. Please follow up BMP/CBC/magnesium 3. Antihypertensives held at discharge for hypomagnesemia and soft BP, follow-up blood pressure and consider reinstituting antihypertensives 4. To continue Eliquis for at least 3 months   Hospital Summary  This is a 59 year old female with past medical history of hypertension, anxiety, recent COVID-19 infection on 09/20/2019 who presented to [facility] with chest pain and shortness of breath and admitted on 11/02/2019 due to acute PE, suspected provoked in setting of recent COVID-19 infection.  She was initially started on heparin drip which was transitioned to Eliquis prior to discharge.  Patient did not require oxygen at rest or ambulation prior to discharge  A & P   Principal Problem:   Acute pulmonary embolism ([place]) Active Problems:   Anxiety state   Essential hypertension   Hypokalemia   1. Acute pulmonary embolism without evidence of right heart strain, suspected provoked in setting of recent COVID 19 infection 1. CTA chest: Marked severity bilateral PE involving predominantly Upper lobe and lower lobe branches of left pulmonary artery, moderate to marked severity left lower lobe infiltrate 2. Initially on heparin drip 3. Transitioned to eliquis at discharge for at least 3 months, reevaluate outpatient 4. Tolerating room air at rest and ambulation 5. Incentive spirometry at discharge 2. Left lower lobe infiltrate 1. Afebrile and mild leukocytosis (likely reactive from PE) 2. Most likely atelectasis, unlikely infectious 3. Incentive spirometry 3. Noncardiac left  chest/flank pain secondary to PE 1. Tramadol did not help 2. Resolved with Toradol x 1 3. Discharged with short term Ibuprofen 4. Hypokalemia 1. Given IV repletion 2. Holding Benicar at discharge 3. Follow up outpatient BMP 5. Hypomagnesemia 1. Holding Benicar 2. Discharged on short term Mag Ox 3. Follow up Mg 6. Hypertension 1. BP has been soft during hospitalization 2. Holding antihypertensive at discharge, reevaluate outpatient 7. Anxiety 1. Continue home Xanax 8. Recent COVID 19 infection 1. Dx 09/20/19, respiratory symptoms resolved prior to admission so patient was not on COVID precautions and did not get retested on admission as within 3 months since positive 2. Likely led to PE, as above    Consultants  . none  Procedures  . none  Antibiotics   Anti-infectives (From admission, onward)   None        Subjective  Evaluated at bedside with husband and bedside nurse. Patient states that she has persistent pain of left chest/flank with deep inspiration prior to toradol which is causing her to limit her inspiration. Encouraged to use incentive spirometer. Using O2 only for comfort not for O2 need. Denies any other complaints.   Nursing noted patients chest pain to have resolved to 0/10 with Toradol  Objective   Discharge Exam: Vitals:   10/17/19 0640 10/17/19 1234  BP: 105/68 102/66  Pulse: 100 91  Resp: 16 (!) 24  Temp: 98.4 F (36.9 C) 98.1 F (36.7 C)  SpO2: 100% 98%   Vitals:   10/17/19 0137 10/17/19 0234 10/17/19 0640 10/17/19 1234  BP: 100/67 101/71 105/68 102/66  Pulse: 91 92 100 91  Resp: (!) 27 20 16  (!) 24  Temp:  98 F (36.7 C) 98.4 F (36.9 C)  98.1 F (36.7 C)  TempSrc:  Oral Oral Oral  SpO2: 100% 100% 100% 98%  Weight:  91.4 kg    Height:  5\' 7"  (1.702 m)      Physical Exam Vitals and nursing note reviewed.  Constitutional:      General: She is not in acute distress.    Appearance: She is not toxic-appearing.  HENT:     Head:  Normocephalic.  Cardiovascular:     Rate and Rhythm: Normal rate and regular rhythm.  Pulmonary:     Effort: No respiratory distress.     Breath sounds: No stridor.     Comments: Rales in bases, atelectasis Musculoskeletal:     Right lower leg: No tenderness. No edema.     Left lower leg: No tenderness. No edema.  Skin:    Coloration: Skin is not cyanotic or pale.  Neurological:     General: No focal deficit present.  Psychiatric:        Mood and Affect: Mood normal.        Behavior: Behavior normal.       The results of significant diagnostics from this hospitalization (including imaging, microbiology, ancillary and laboratory) are listed below for reference.     Microbiology: No results found for this or any previous visit (from the past 240 hour(s)).   Labs: BNP (last 3 results) No results for input(s): BNP in the last 8760 hours. Basic Metabolic Panel: Recent Labs  Lab 10/16/19 2049 10/16/19 2249 10/17/19 0448  NA 137  --  138  K 3.0*  --  3.3*  CL 102  --  104  CO2 22  --  20*  GLUCOSE 113*  --  97  BUN 8  --  8  CREATININE 0.83  --  0.66  CALCIUM 9.2  --  8.7*  MG  --  1.4* 1.4*  PHOS  --   --  2.6   Liver Function Tests: No results for input(s): AST, ALT, ALKPHOS, BILITOT, PROT, ALBUMIN in the last 168 hours. No results for input(s): LIPASE, AMYLASE in the last 168 hours. No results for input(s): AMMONIA in the last 168 hours. CBC: Recent Labs  Lab 10/16/19 2049 10/17/19 0448  WBC 12.1* 10.5  NEUTROABS 7.8* 5.7  HGB 12.3 11.3*  HCT 38.4 37.0  MCV 89.7 92.0  PLT 221 209   Cardiac Enzymes: No results for input(s): CKTOTAL, CKMB, CKMBINDEX, TROPONINI in the last 168 hours. BNP: Invalid input(s): POCBNP CBG: No results for input(s): GLUCAP in the last 168 hours. D-Dimer Recent Labs    10/16/19 2049  DDIMER 3.59*   Hgb A1c No results for input(s): HGBA1C in the last 72 hours. Lipid Profile No results for input(s): CHOL, HDL, LDLCALC,  TRIG, CHOLHDL, LDLDIRECT in the last 72 hours. Thyroid function studies No results for input(s): TSH, T4TOTAL, T3FREE, THYROIDAB in the last 72 hours.  Invalid input(s): FREET3 Anemia work up No results for input(s): VITAMINB12, FOLATE, FERRITIN, TIBC, IRON, RETICCTPCT in the last 72 hours. Urinalysis    Component Value Date/Time   COLORURINE LT. YELLOW 10/26/2011 0902   APPEARANCEUR CLEAR 10/26/2011 0902   LABSPEC 1.025 10/26/2011 0902   PHURINE 6.0 10/26/2011 0902   GLUCOSEU NEGATIVE 10/26/2011 0902   HGBUR MODERATE 10/26/2011 0902   BILIRUBINUR NEGATIVE 10/26/2011 0902   KETONESUR NEGATIVE 10/26/2011 0902   UROBILINOGEN 0.2 10/26/2011 0902   NITRITE NEGATIVE 10/26/2011 0902   LEUKOCYTESUR NEGATIVE 10/26/2011 0902   Sepsis Labs Invalid input(s): PROCALCITONIN,  WBC,  [place] Microbiology No results found for this or any previous visit (from the past 240 hour(s)).  Discharge Instructions     Discharge Instructions    Diet - low sodium heart healthy   Complete by: As directed    Discharge instructions   Complete by: As directed    You were seen and examined in the hospital for pulmonary embolism and cared for by a hospitalist.   Upon Discharge:  -Take Eliquis 10 mg twice daily for the next 7 days (next dose 10 PM 2/4) followed by 5 mg twice daily for at least the next 3 months -Stop taking your blood pressure medication (Benicar) for now as your blood pressure was low in the hospital and you had a low magnesium level.   -You can take ibuprofen 600 mg every 6 hours as needed for moderate to severe pain over the next 7 days, monitor your blood pressure while taking this medication as it can raise your BP. -Take magnesium supplements over the next 2 days as prescribed -Follow-up with your primary care physician with blood work in the next 5 to 7 days  Bring all home medications to your appointment to review Request that your primary physician go over all hospital tests  and procedures/radiological results at the follow up.   Please get all hospital records sent to your physician by signing a hospital release before you go home.     Read the complete instructions along with all the possible side effects for all the medicines you take and that have been prescribed to you. Take any new medicines after you have completely understood and accept all the possible adverse reactions/side effects.   If you have any questions about your discharge medications or the care you received while you were in the hospital, you can call the unit and asked to speak with the hospitalist on call. Once you are discharged, your primary care physician will handle any further medical issues. Please note that NO REFILLS for any discharge medications will be authorized, as it is imperative that you return to your primary care physician (or establish a relationship with a primary care physician if you do not have one) for your aftercare needs so that they can reassess your need for medications and monitor your lab values.   Do not drive, operate heavy machinery, perform activities at heights, swimming or participation in water activities or provide baby sitting services if your were admitted for loss of consciousness/seizures or if you are on sedating medications including, but not limited to benzodiazepines, sleep medications, narcotic pain medications, etc., until you have been cleared to do so by a medical doctor.   Do not take more than prescribed medications.   Wear a seat belt while driving.  If you have smoked or chewed Tobacco in the last 2 years please stop smoking; also stop any regular Alcohol and/or any Recreational drug use including marijuana.  If you experience worsening of your admission symptoms or develop shortness of breath, chest pain, suicidal or homicidal thoughts or experience a life threatening emergency, you must seek medical attention immediately by calling 911 or  calling your PCP immediately.   Incentive spirometry RT   Complete by: As directed    Increase activity slowly   Complete by: As directed      Allergies as of 10/17/2019      Reactions   Oxycodone-acetaminophen Itching   TYLOX ALSO      Medication List    STOP taking these  medications   olmesartan-hydrochlorothiazide 40-12.5 MG tablet Commonly known as: BENICAR HCT     TAKE these medications   ALPRAZolam 0.25 MG tablet Commonly known as: XANAX Take 1 tablet (0.25 mg total) by mouth at bedtime as needed for anxiety.   apixaban 5 MG Tabs tablet Commonly known as: ELIQUIS Take 2 tablets (10 mg total) by mouth 2 (two) times daily for 7 days.   apixaban 5 MG Tabs tablet Commonly known as: ELIQUIS Take 1 tablet (5 mg total) by mouth 2 (two) times daily. Start taking on: October 24, 2019   estradiol 1 MG tablet Commonly known as: ESTRACE Take 1 mg by mouth daily.   ibuprofen 600 MG tablet Commonly known as: ADVIL Take 1 tablet (600 mg total) by mouth every 6 (six) hours as needed for up to 7 days for moderate pain.   magnesium oxide 400 (241.3 Mg) MG tablet Commonly known as: MAG-OX Take 1 tablet (400 mg total) by mouth 2 (two) times daily for 2 days.   NP Thyroid 120 MG tablet Generic drug: thyroid Take 1 tablet (120 mg total) by mouth daily.   progesterone 200 MG capsule Commonly known as: PROMETRIUM Take 2 capsules (400 mg total) by mouth daily at 12 noon.   TESTOSTERONE CYPIONATE IJ Inject 5 mg as directed every 7 (seven) days. Compounded       Allergies  Allergen Reactions  . Oxycodone-Acetaminophen Itching    TYLOX ALSO    Time coordinating discharge: Over 30 minutes   SIGNED:   Harold Hedge, D.O. [facility] Pager: [phone]  10/17/2019, 2:51 PM

## 2019-10-17 NOTE — Progress Notes (Signed)
Patient [name] got infiltrated while infusing IV potassium  with NS at 100 ML/HR. Two bags completed and two left to be infuse. Pt wish not to cont with any more IV potassium since she can swallow po well. IV was put in by IV team with [place] due to patient is hard stick. Patient in no acute distress at this time. NP made aware.

## 2019-10-17 NOTE — ED Notes (Signed)
Attempted to call report. Receiving floor stated they have not approved the patient yet.

## 2019-10-17 NOTE — TOC Benefit Eligibility Note (Signed)
Transition of Care [facility]) Benefit Eligibility Note    Patient Details  Name: [name] MRN: [id number] Date of Birth: October 14, 1960   Medication/Dose: Arne [place]   2.5 MG BID   and   ELIQUIS  5 MG BID  Covered?: Yes  Tier: 3 Drug  Prescription Coverage Preferred Pharmacy: [facility]  and    [facility]  Spoke with Person/Company/Phone Number:: ADRIANA   @ [employer] #  [phone]  Co-Pay: $ 120.68  Prior Approval: No  Deductible: Unmet  Additional Notes: APIXABAN  AND  ELIQUIS  10 MG : NON-FORMULARY    Memory Argue Phone Number: 10/17/2019, 11:59 AM

## 2019-10-17 NOTE — TOC Initial Note (Signed)
Transition of Care [facility]) - Initial/Assessment Note    Patient Details  Name: [name] MRN: [id number] Date of Birth: 11-20-1960  [facility] of [facility]) CM/SW Contact:    Dessa Phi, RN Phone Number: 10/17/2019, 11:01 AM  Clinical Narrative: Benefit check for Eliquis-await outcome.Pharmacy to provide free 30day trial coupon.                  Expected Discharge Plan: Home/Self Care Barriers to Discharge: Continued Medical Work up   Patient Goals and CMS Choice        Expected Discharge Plan and Services Expected Discharge Plan: Home/Self Care   Discharge Planning Services: CM Consult   Living arrangements for the past 2 months: Single Family Home                                      Prior Living Arrangements/Services Living arrangements for the past 2 months: Single Family Home Lives with:: Spouse Patient language and need for interpreter reviewed:: Yes Do you feel safe going back to the place where you live?: Yes      Need for Family Participation in Patient Care: No (Comment) Care giver support system in place?: Yes (comment)   Criminal Activity/Legal Involvement Pertinent to Current Situation/Hospitalization: No - Comment as needed  Activities of Daily Living Home Assistive Devices/Equipment: Eyeglasses ADL Screening (condition at time of admission) Patient's cognitive ability adequate to safely complete daily activities?: Yes Is the patient deaf or have difficulty hearing?: No Does the patient have difficulty seeing, even when wearing glasses/contacts?: No Does the patient have difficulty concentrating, remembering, or making decisions?: No Patient able to express need for assistance with ADLs?: Yes Does the patient have difficulty dressing or bathing?: No Independently performs ADLs?: Yes (appropriate for developmental age) Does the patient have difficulty walking or climbing stairs?: No Weakness of Legs: None Weakness of Arms/Hands:  None  Permission Sought/Granted Permission sought to share information with : Case Manager Permission granted to share information with : Yes, Verbal Permission Granted  Share Information with NAME: Case Manager     Permission granted to share info w Relationship: [name] spouse [id number]     Emotional Assessment Appearance:: Appears stated age Attitude/Demeanor/Rapport: Gracious Affect (typically observed): Accepting Orientation: : Oriented to Self, Oriented to Place, Oriented to  Time, Oriented to Situation Alcohol / Substance Use: Not Applicable Psych Involvement: No (comment)  Admission diagnosis:  Acute pulmonary embolism (H[facility]) [I26.99] Acute pulmonary embolism without acute cor pulmonale, unspecified pulmonary embolism type ([facility]) [I26.99] Patient Active Problem List   Diagnosis Date Noted  . Hypokalemia 10/17/2019  . Acute pulmonary embolism ([place]) 10/16/2019  . PCOS (polycystic ovarian syndrome) 05/16/2019  . Vitamin D deficiency disease 05/16/2019  . Obesity (BMI 35.0-39.9 without comorbidity) 05/16/2019  . Sinus tachycardia 09/10/2015  . Hyperglycemia 06/22/2015  . Posterior tibialis tendon insufficiency 04/16/2015  . Headache 07/30/2014  . Primary localized osteoarthrosis, lower leg 01/31/2014  . ALLERGIC RHINITIS 11/25/2010  . Essential hypertension 07/21/2010  . Anxiety state 05/19/2010  . DEPRESSION 05/19/2010   PCP:  Doree Albee, MD Pharmacy:   [facility] [place] [address] Phone: [phone] Fax: [phone]  [facility] [place], [place]  [phone] Phone: [phone] Fax: [phone]     Social Determinants of Health (SDOH) Interventions    Readmission Risk Interventions No flowsheet data found.

## 2019-10-17 NOTE — Progress Notes (Signed)
ANTICOAGULATION CONSULT NOTE -  Consult  Pharmacy Consult for IV heparin Indication: pulmonary embolus  Allergies  Allergen Reactions  . Oxycodone-Acetaminophen Itching    TYLOX ALSO    Patient Measurements: Height: 5\' 7"  (170.2 cm) Weight: 201 lb 9.6 oz (91.4 kg) IBW/kg (Calculated) : 61.6 Heparin Dosing Weight: 70 kg  Vital Signs: Temp: 98 F (36.7 C) (02/04 0234) Temp Source: Oral (02/04 0234) BP: 101/71 (02/04 0234) Pulse Rate: 92 (02/04 0234)  Labs: Recent Labs    10/16/19 2049 10/16/19 2249 10/17/19 0448  HGB 12.3  --  11.3*  HCT 38.4  --  37.0  PLT 221  --  209  APTT  --  33  --   LABPROT  --  14.5  --   INR  --  1.1  --   HEPARINUNFRC  --   --  0.75*  CREATININE 0.83  --  0.66  TROPONINIHS 4 3  --     Estimated Creatinine Clearance: 88.9 mL/min (by C-G formula based on SCr of 0.66 mg/dL).   Medical History: Past Medical History:  Diagnosis Date  . Anxiety state, unspecified   . DEPRESSION   . Hypertension   . Obesity (BMI 35.0-39.9 without comorbidity) 05/16/2019  . PCOS (polycystic ovarian syndrome) 05/16/2019  . Sinus tachycardia 09/10/2015  . Tear of MCL (medial collateral ligament) of knee 11/2012   left knee  . Vitamin D deficiency disease 05/16/2019    Medications:  Scheduled:  Infusions:   Assessment: 55 yoF Covid + 1/7, now with increasing SOB. CT shows biltateral PE no heart strain.  Baseline labs: INR = 1.1, aptt = 33,  H/H plts WNL Today, 2/4  0448 HL = 0.75 at high end of therapeutic, no infusion or bleeding issues per RN. Was drawn at 5.5 hours vs 6 hours and pt received large bolus.   Goal of Therapy:  Heparin level 0.3-0.7 units/ml Monitor platelets by anticoagulation protocol: Yes    Plan:  Will decrease heparin drip just slightly to 1250 units/hr- hopefully won't trend down too much as bolus wears off Recheck HL in 6 hours Daily CBC/HL   Lawana Pai R 10/17/2019,5:50 AM

## 2019-10-17 NOTE — ED Notes (Signed)
Attempted to obtain second iv access for potassium unsuccessfully. Second RN attempting now.

## 2019-10-17 NOTE — ED Notes (Addendum)
4th floor is working on patient placement. Will call back to give report when placement is finalized.

## 2019-10-17 NOTE — Progress Notes (Signed)
ANTICOAGULATION CONSULT NOTE -  Consult  Pharmacy Consult for apixaban Indication: pulmonary embolus  Allergies  Allergen Reactions  . Oxycodone-Acetaminophen Itching    TYLOX ALSO    Patient Measurements: Height: 5\' 7"  (170.2 cm) Weight: 201 lb 9.6 oz (91.4 kg) IBW/kg (Calculated) : 61.6 Heparin Dosing Weight: 70 kg  Vital Signs: Temp: 98.1 F (36.7 C) (02/04 1234) Temp Source: Oral (02/04 1234) BP: 102/66 (02/04 1234) Pulse Rate: 91 (02/04 1234)  Labs: Recent Labs    10/16/19 2049 10/16/19 2249 10/17/19 0448  HGB 12.3  --  11.3*  HCT 38.4  --  37.0  PLT 221  --  209  APTT  --  33  --   LABPROT  --  14.5  --   INR  --  1.1  --   HEPARINUNFRC  --   --  0.75*  CREATININE 0.83  --  0.66  TROPONINIHS 4 3  --     Estimated Creatinine Clearance: 88.9 mL/min (by C-G formula based on SCr of 0.66 mg/dL).   Medical History: Past Medical History:  Diagnosis Date  . Anxiety state, unspecified   . DEPRESSION   . Hypertension   . Obesity (BMI 35.0-39.9 without comorbidity) 05/16/2019  . PCOS (polycystic ovarian syndrome) 05/16/2019  . Sinus tachycardia 09/10/2015  . Tear of MCL (medial collateral ligament) of knee 11/2012   left knee  . Vitamin D deficiency disease 05/16/2019    Assessment: 28 yoF Covid + 1/7, now with increasing SOB. CT shows biltateral PE no heart strain.   Baseline labs: INR = 1.1, aptt = 33,  H/H plts WNL  Today, 2/4  -Patient has been on IV heparin. Pharmacy consulted to transition patient to apixaban for treatment of PE.  -CBC:  Hgb slightly low, Plt WNL and stable -SCr 0.7, CrCl ~85 mL/min   Plan:   Discontinue heparin at time of apixaban initiation  Apixaban 10 mg PO BID x 7 days followed by apixaban 5 mg PO BID  Monitor CBC q72h while inpatient  Manufacturer coupon and medication counseling provided  Lenis Noon, PharmD 10/17/2019,1:50 PM

## 2019-10-17 NOTE — Progress Notes (Signed)
PT Cancellation Note  Patient Details Name: [name] MRN: [id number] DOB: 05-09-1961   Cancelled Treatment:    Reason Eval/Treat Not Completed: Medical issues which prohibited therapy(Pt admitted with pulmonary embolism. Noted heparin started at 23:09 10/16/19. Per rehab protocol, mobility will be held until 24* after start of heparin. Will follow.)   Philomena Doheny PT 10/17/2019  Acute Rehabilitation Services Pager [phone] Office [phone]

## 2019-10-17 NOTE — Discharge Instructions (Signed)
Information on my medicine - ELIQUIS (apixaban)  This medication education was reviewed with me or my healthcare representative as part of my discharge preparation.    Why was Eliquis prescribed for you? Eliquis was prescribed to treat blood clots that may have been found in the veins of your legs (deep vein thrombosis) or in your lungs (pulmonary embolism) and to reduce the risk of them occurring again.  What do You need to know about Eliquis ? The starting dose is 10 mg (two 5 mg tablets) taken TWICE daily for the FIRST SEVEN (7) DAYS, then on (enter date)  10/24/19  the dose is reduced to ONE 5 mg tablet taken TWICE daily.  Eliquis may be taken with or without food.   Try to take the dose about the same time in the morning and in the evening. If you have difficulty swallowing the tablet whole please discuss with your pharmacist how to take the medication safely.  Take Eliquis exactly as prescribed and DO NOT stop taking Eliquis without talking to the doctor who prescribed the medication.  Stopping may increase your risk of developing a new blood clot.  Refill your prescription before you run out.  After discharge, you should have regular check-up appointments with your healthcare provider that is prescribing your Eliquis.    What do you do if you miss a dose? If a dose of ELIQUIS is not taken at the scheduled time, take it as soon as possible on the same day and twice-daily administration should be resumed. The dose should not be doubled to make up for a missed dose.  Important Safety Information A possible side effect of Eliquis is bleeding. You should call your healthcare provider right away if you experience any of the following: ? Bleeding from an injury or your nose that does not stop. ? Unusual colored urine (red or dark brown) or unusual colored stools (red or black). ? Unusual bruising for unknown reasons. ? A serious fall or if you hit your head (even if there is no  bleeding).  Some medicines may interact with Eliquis and might increase your risk of bleeding or clotting while on Eliquis. To help avoid this, consult your healthcare provider or pharmacist prior to using any new prescription or non-prescription medications, including herbals, vitamins, non-steroidal anti-inflammatory drugs (NSAIDs) and supplements.  This website has more information on Eliquis (apixaban): http://www.eliquis.com/eliquis/home

## 2019-10-23 MED FILL — NP THYROID 120 MG TABLET: 120 | 30 days supply | Qty: 30 | Fill #0

## 2019-10-23 MED FILL — OLMESARTAN-HCTZ 40-12.5 MG: 40-12.5 | 30 days supply | Qty: 15 | Fill #0

## 2019-10-23 MED FILL — PROGESTERONE MICRONIZED 200: 200 | 30 days supply | Qty: 60 | Fill #2

## 2019-10-23 MED FILL — ESTRADIOL 1 MG TABS: 1 | 30 days supply | Qty: 30 | Fill #1

## 2019-10-29 ENCOUNTER — Ambulatory Visit (INDEPENDENT_AMBULATORY_CARE_PROVIDER_SITE_OTHER): Payer: 59 | Admitting: Nurse Practitioner

## 2019-10-29 ENCOUNTER — Encounter (INDEPENDENT_AMBULATORY_CARE_PROVIDER_SITE_OTHER): Payer: Self-pay | Admitting: Nurse Practitioner

## 2019-10-29 ENCOUNTER — Other Ambulatory Visit: Payer: Self-pay

## 2019-10-29 VITALS — BP 130/82 | HR 107 | Temp 96.5°F | Resp 18 | Ht 67.0 in | Wt 195.0 lb

## 2019-10-29 DIAGNOSIS — I2609 Other pulmonary embolism with acute cor pulmonale: Secondary | ICD-10-CM

## 2019-10-29 DIAGNOSIS — I10 Essential (primary) hypertension: Secondary | ICD-10-CM | POA: Diagnosis not present

## 2019-10-29 DIAGNOSIS — E876 Hypokalemia: Secondary | ICD-10-CM | POA: Diagnosis not present

## 2019-10-29 NOTE — Progress Notes (Signed)
Subjective:  Patient ID: [name], female    DOB: [birth year]  Age: 59 y.o. MRN: [id number]  CC:  Chief Complaint  Patient presents with  . Follow-up    Post hospitalization f/u      HPI  This patient presents today for the above.  She was hospitalized earlier this month for pulmonary embolism.  She was discharged on 10/17/2019.  She tells me she is currently taking Eliquis 10 mg twice a day.  She denies any signs of bleeding, chest pain, shortness of breath.  She would like to be referred to a pulmonologist for further evaluation.  She does continue on bioidentical hormone placement therapy including testosterone, estradiol, and progesterone.  She tolerates his medications quite well.  Of note the discharge summary recommends rechecking metabolic panel, CBC, and magnesium level.  Discharge summary also indicates that patient was told to hold her blood pressure medication upon discharge because she had low blood pressure in the hospital.  Today the patient tells me she is taking her blood pressure medication is not holding it.  She is not sure which blood pressure medication she is on, but per chart review she was on olmesartan/hydrochlorothiazide 40-12.5 mg tablets, she tells me she takes 1/2 tablet daily.  She is also on magnesium for replacement, but is not sure how much she is taking currently.   Past Medical History:  Diagnosis Date  . Anxiety state, unspecified   . DEPRESSION   . Hypertension   . Obesity (BMI 35.0-39.9 without comorbidity) 05/16/2019  . PCOS (polycystic ovarian syndrome) 05/16/2019  . Sinus tachycardia 09/10/2015  . Tear of MCL (medial collateral ligament) of knee 11/2012   left knee  . Vitamin D deficiency disease 05/16/2019      Family History  Problem Relation Age of Onset  . Hypertension Mother   . Cancer Mother        Kidney cancer  . Hypertension Father   . Diabetes Maternal Grandmother        amputation  . Colon cancer Neg Hx   . Rectal  cancer Neg Hx   . Stomach cancer Neg Hx     Social History   Social History Narrative  . Not on file   Social History   Tobacco Use  . Smoking status: Former Smoker    Years: 6.00    Types: Cigarettes    Quit date: 12/12/2000    Years since quitting: 18.8  . Smokeless tobacco: Never Used  Substance Use Topics  . Alcohol use: Yes    Comment: White wine      Current Meds  Medication Sig  . ALPRAZolam (XANAX) 0.25 MG tablet Take 1 tablet (0.25 mg total) by mouth at bedtime as needed for anxiety.  Marland Kitchen apixaban (ELIQUIS) 5 MG TABS tablet Take 1 tablet (5 mg total) by mouth 2 (two) times daily.  . magnesium oxide (MAG-OX) 400 MG tablet Take 800 mg by mouth daily.  . NP THYROID 120 MG tablet Take 1 tablet (120 mg total) by mouth daily.  Marland Kitchen olmesartan-hydrochlorothiazide (BENICAR HCT) 40-12.5 MG tablet Take 0.5 tablets by mouth daily.  . [DISCONTINUED] estradiol (ESTRACE) 1 MG tablet Take 1 mg by mouth daily.  . [DISCONTINUED] progesterone (PROMETRIUM) 200 MG capsule Take 2 capsules (400 mg total) by mouth daily at 12 noon.  . [DISCONTINUED] TESTOSTERONE CYPIONATE IJ Inject 5 mg as directed every 7 (seven) days. Compounded    ROS:  Review of Systems  Constitutional: Negative  for malaise/fatigue.  Respiratory: Negative for cough, shortness of breath and wheezing.   Cardiovascular: Negative for chest pain.  Gastrointestinal: Negative for abdominal pain and blood in stool.     Objective:   Today's Vitals: BP 130/82 (BP Location: Right Arm, Patient Position: Sitting, Cuff Size: Normal)   Pulse (!) 107   Temp (!) 96.5 F (35.8 C) (Temporal)   Resp 18   Ht '5\' 7"'  (1.702 m)   Wt 195 lb (88.5 kg)   LMP 06/18/2015   SpO2 99% Comment: Wearing mask.  BMI 30.54 kg/m  Vitals with BMI 10/29/2019 10/17/2019 10/17/2019  Height '5\' 7"'  - -  Weight 195 lbs - -  BMI 69.62 - -  Systolic 952 841 324  Diastolic 82 66 68  Pulse 401 91 100     Physical Exam Vitals reviewed.  Constitutional:        General: She is not in acute distress.    Appearance: Normal appearance.  HENT:     Head: Normocephalic and atraumatic.  Neck:     Vascular: No carotid bruit.  Cardiovascular:     Rate and Rhythm: Normal rate and regular rhythm.     Pulses: Normal pulses.     Heart sounds: Normal heart sounds.  Pulmonary:     Effort: Pulmonary effort is normal.     Breath sounds: Normal breath sounds.  Skin:    General: Skin is warm and dry.  Neurological:     General: No focal deficit present.     Mental Status: She is alert and oriented to person, place, and time.  Psychiatric:        Mood and Affect: Mood normal.        Behavior: Behavior normal.        Judgment: Judgment normal.          Assessment   1. Acute pulmonary embolism with acute cor pulmonale, unspecified pulmonary embolism type ([place])   2. Essential hypertension   3. Hypokalemia   4. Hypomagnesemia       Tests ordered Orders Placed This Encounter  Procedures  . CMP with eGFR([facility])  . CBC  . Magnesium  . Ambulatory referral to Pulmonology     Plan: Please see assessment and plan per problem list below.   No orders of the defined types were placed in this encounter.   Patient to follow-up in 1 month  Ailene Ards, NP

## 2019-10-29 NOTE — Assessment & Plan Note (Signed)
I will collect blood work today for further evaluation to see if she needs to continue taking her supplement.  I also asked her to check her medicine cabinet and let me know via MyChart what dose of replacement she is taking.

## 2019-10-29 NOTE — Assessment & Plan Note (Signed)
I told her to reduce her Eliquis from 10 mg twice a day to 5 mg by mouth twice a day.  She tells me she understands.  I will refer her to pulmonology per her request.  I have also told her to hold her bioidentical hormone replacement therapy at this time.  She will follow-up with Dr. Anastasio Champion in approximately 1 month to determine if she can reinitiate her testosterone, estradiol, and progesterone.  I also told her to let me know if she experiences any signs of bleeding or worsening symptoms such as chest pain and/or shortness of breath.  She tells me she understands.

## 2019-10-29 NOTE — Patient Instructions (Addendum)
Thank you for choosing [facility] as your medical provider! If you have any questions or concerns regarding your health care, please do not hesitate to call our office.  Some changes to medication were made: 1.  Eliquis: Take 5 mg by mouth twice a day.  Check your pill bottle at home.  If the pills are 5 mg/pill then take only 1 pill by mouth in the morning and 1 pill by mouth in the evening.  If the pill bottle has 10 mg pills in it please notify me via MyChart and I will send a prescription for 5 mg tablets.  You will need to stay on this medication for at least 3 months maybe even 6 months.  Monitor for any signs of bleeding and if this occurs please let us know.  Per your request I have referred you to pulmonology for further evaluation and management.  2.  Blood pressure medication: Please check your pill bottle at home and let me know what you are taking.  I believe you are taking a combination pill called olmesartan-hydrochlorothiazide (40 mg - 12.5 mg).  If you are taking something other than this notify me via MyChart and I will update your chart.  Blood pressure is well controlled today so I will not make any changes to her medication  3.  Magnesium supplement: Please check your pill bottle at home and let me know exactly how much magnesium you are taking.  We are checking blood work today to make sure your magnesium has normalized.  I will let you know if you can stop this medication once we get your blood work back.  4.  Testosterone and other hormone replacement: Please hold all of your hormone replacement for now.  Once Dr. Anastasio Champion returns to the office I will let him know that we are holding this medication and if you would like you to restart it I will ask him to reach out to you.  Please follow-up as scheduled in 1 month. We look forward to seeing you again soon!   At [facility] we value your feedback. You may receive a survey about your visit today. Please share  your experience as we strive to create trusting relationships with our patients to provide genuine, compassionate, quality care.  We appreciate your understanding and patience as we review any laboratory studies, imaging, and other diagnostic tests that are ordered as we care for you. We do our best to address any and all results in a timely manner. If you do not hear about test results within 1 week, please do not hesitate to contact us. If we referred you to a specialist during your visit or ordered imaging testing, contact the office if you have not been contacted to be scheduled within 1 weeks.  We also encourage the use of MyChart, which contains your medical information for your review as well. If you are not enrolled in this feature, an access code is on this after visit summary for your convenience. Thank you for allowing [place] to be involved in your care.

## 2019-10-29 NOTE — Assessment & Plan Note (Signed)
She will continue taking her antihypertensive as she is taking it now.  I asked her to look in her medicine cabinet and notify me in my chart what medication she is actually taking for her blood pressure.  I will collect blood work today including CMP, CBC, and mag level.

## 2019-10-30 ENCOUNTER — Other Ambulatory Visit (INDEPENDENT_AMBULATORY_CARE_PROVIDER_SITE_OTHER): Payer: Self-pay | Admitting: Nurse Practitioner

## 2019-10-30 DIAGNOSIS — D649 Anemia, unspecified: Secondary | ICD-10-CM

## 2019-10-30 LAB — COMPLETE METABOLIC PANEL WITH GFR
AG Ratio: 1.3 (calc) (ref 1.0–2.5)
ALT: 9 U/L (ref 6–29)
AST: 10 U/L (ref 10–35)
Albumin: 4.1 g/dL (ref 3.6–5.1)
Alkaline phosphatase (APISO): 50 U/L (ref 37–153)
BUN: 14 mg/dL (ref 7–25)
CO2: 21 mmol/L (ref 20–32)
Calcium: 9.7 mg/dL (ref 8.6–10.4)
Chloride: 107 mmol/L (ref 98–110)
Creat: 0.72 mg/dL (ref 0.50–1.05)
GFR, Est African American: 107 mL/min/{1.73_m2} (ref >=60)
GFR, Est Non African American: 92 mL/min/{1.73_m2} (ref >=60)
Globulin: 3.2 g/dL (calc) (ref 1.9–3.7)
Glucose, Bld: 87 mg/dL (ref 65–99)
Potassium: 4.5 mmol/L (ref 3.5–5.3)
Sodium: 138 mmol/L (ref 135–146)
Total Bilirubin: 0.3 mg/dL (ref 0.2–1.2)
Total Protein: 7.3 g/dL (ref 6.1–8.1)

## 2019-10-30 LAB — CBC
HCT: 34.7 % — ABNORMAL LOW (ref 35.0–45.0)
Hemoglobin: 11.6 g/dL — ABNORMAL LOW (ref 11.7–15.5)
MCH: 28.6 pg (ref 27.0–33.0)
MCHC: 33.4 g/dL (ref 32.0–36.0)
MCV: 85.7 fL (ref 80.0–100.0)
MPV: 9.4 fL (ref 7.5–12.5)
Platelets: 533 10*3/uL — ABNORMAL HIGH (ref 140–400)
RBC: 4.05 10*6/uL (ref 3.80–5.10)
RDW: 12.9 % (ref 11.0–15.0)
WBC: 5.8 10*3/uL (ref 3.8–10.8)

## 2019-10-30 LAB — MAGNESIUM: Magnesium: 1.8 mg/dL (ref 1.5–2.5)

## 2019-10-30 NOTE — Progress Notes (Signed)
Follow up lab work

## 2019-11-04 MED FILL — ELIQUIS 5 MG TABLET: 5 | 30 days supply | Qty: 60 | Fill #1

## 2019-11-20 ENCOUNTER — Other Ambulatory Visit (INDEPENDENT_AMBULATORY_CARE_PROVIDER_SITE_OTHER): Payer: Self-pay | Admitting: Internal Medicine

## 2019-11-20 MED FILL — OLMESARTAN-HCTZ 40-12.5 MG: 40-12.5 | 30 days supply | Qty: 15 | Fill #0

## 2019-11-20 MED FILL — NP THYROID 120 MG TABLET: 120 | 30 days supply | Qty: 30 | Fill #1

## 2019-11-20 MED FILL — ESTRADIOL 1 MG TABS: 1 | 30 days supply | Qty: 30 | Fill #2

## 2019-11-26 MED FILL — PROGESTERONE MICRONIZED 200: 200 | 30 days supply | Qty: 60 | Fill #0

## 2019-12-04 ENCOUNTER — Ambulatory Visit (INDEPENDENT_AMBULATORY_CARE_PROVIDER_SITE_OTHER): Payer: 59 | Admitting: Internal Medicine

## 2019-12-04 ENCOUNTER — Encounter (INDEPENDENT_AMBULATORY_CARE_PROVIDER_SITE_OTHER): Payer: Self-pay | Admitting: Internal Medicine

## 2019-12-04 ENCOUNTER — Other Ambulatory Visit: Payer: Self-pay

## 2019-12-04 VITALS — BP 115/70 | HR 95 | Temp 97.5°F | Ht 67.0 in | Wt 201.2 lb

## 2019-12-04 DIAGNOSIS — I10 Essential (primary) hypertension: Secondary | ICD-10-CM | POA: Diagnosis not present

## 2019-12-04 DIAGNOSIS — E282 Polycystic ovarian syndrome: Secondary | ICD-10-CM | POA: Diagnosis not present

## 2019-12-04 DIAGNOSIS — E669 Obesity, unspecified: Secondary | ICD-10-CM

## 2019-12-04 DIAGNOSIS — D649 Anemia, unspecified: Secondary | ICD-10-CM

## 2019-12-04 NOTE — Progress Notes (Signed)
Metrics: Intervention Frequency ACO  Documented Smoking Status Yearly  Screened one or more times in 24 months  Cessation Counseling or  Active cessation medication Past 24 months  Past 24 months   Guideline developer: UpToDate (See UpToDate for funding source) Date Released: 2014       Wellness Office Visit  Subjective:  Patient ID: [name], female    DOB: Jan 26, 1961  Age: 59 y.o. MRN: [id number]  CC: This lady comes in for follow-up hypertension, PCOS and recent diagnosis of pulmonary embolism. HPI She apparently had COVID-19 vaccination and soon after no symptoms of COVID-19 and had pulmonary embolism.  She is now on anticoagulation therapy and is due to see pulmonology in the next few weeks. She was seen by Judson Roch and all her hormones were stopped.  Unfortunately, she got severe hot flashes and did not feel well and has restarted estradiol and progesterone.  She has not restarted testosterone at the present time. She does feel much improved once she started back to estradiol and progesterone.  Past Medical History:  Diagnosis Date  . Anxiety state, unspecified   . DEPRESSION   . Hypertension   . Obesity (BMI 35.0-39.9 without comorbidity) 05/16/2019  . PCOS (polycystic ovarian syndrome) 05/16/2019  . Sinus tachycardia 09/10/2015  . Tear of MCL (medial collateral ligament) of knee 11/2012   left knee  . Vitamin D deficiency disease 05/16/2019      Family History  Problem Relation Age of Onset  . Hypertension Mother   . Cancer Mother        Kidney cancer  . Hypertension Father   . Diabetes Maternal Grandmother        amputation  . Colon cancer Neg Hx   . Rectal cancer Neg Hx   . Stomach cancer Neg Hx     Social History   Social History Narrative   Married for 11 years,second.Lives with husband.Works at [employer].   Social History   Tobacco Use  . Smoking status: Former Smoker    Years: 6.00    Types: Cigarettes    Quit date: 12/12/2000    Years since quitting: 18.9  . Smokeless tobacco: Never Used  Substance Use Topics  . Alcohol use: Yes    Comment: White wine     Current Meds  Medication Sig  . ALPRAZolam (XANAX) 0.25 MG tablet Take 1 tablet (0.25 mg total) by mouth at bedtime as needed for anxiety.  Marland Kitchen apixaban (ELIQUIS) 5 MG TABS tablet Take 1 tablet (5 mg total) by mouth 2 (two) times daily.  Marland Kitchen estradiol (ESTRACE) 1 MG tablet Take 1 mg by mouth daily.  . magnesium oxide (MAG-OX) 400 MG tablet Take 800 mg by mouth daily.  . NP THYROID 120 MG tablet Take 1 tablet (120 mg total) by mouth daily.  Marland Kitchen olmesartan-hydrochlorothiazide (BENICAR HCT) 40-12.5 MG tablet TAKE 1/2 TABLET BY MOUTH ONCE A DAY (NEEDS OFFICE VISIT)  . progesterone (PROMETRIUM) 200 MG capsule Take 400 mg by mouth daily.       Objective:   Today's Vitals: BP 115/70 (BP Location: Right Arm, Patient Position: Sitting, Cuff Size: Normal)   Pulse 95   Temp (!) 97.5 F (36.4 C) (Temporal)   Ht 5\' 7"  (1.702 m)   Wt 201 lb 3.2 oz (91.3 kg)   LMP 06/18/2015   SpO2 96%   BMI 31.51 kg/m  Vitals with BMI 12/04/2019 10/29/2019 10/17/2019  Height 5\' 7"  5\' 7"  -  Weight 201 lbs 3  oz 195 lbs -  BMI [id number] -  Systolic [id number]  Diastolic 70 82 66  Pulse 95 107 91     Physical Exam   She looks systemically well.  She has gained about 6 pounds since February.  Blood pressure is in a good range.  Lung fields are clear.    Assessment   1. PCOS (polycystic ovarian syndrome)   2. Essential hypertension   3. Obesity (BMI 35.0-39.9 without comorbidity)   4. Anemia, unspecified type       Tests ordered Orders Placed This Encounter  Procedures  . COMPLETE METABOLIC PANEL WITH GFR  . CBC  . Estradiol  . Progesterone     Plan: 1. Blood work is ordered above. 2. I do not have a problem with the patient restarting estradiol and progesterone.  These bioidentical hormones have never shown in studies to cause increased risk of thromboembolic  disease.  In fact, testosterone also has not shown to cause increased risk of thromboembolic disease.  We can restart testosterone at a later date but for the time being, we will check estradiol and progesterone levels. 3. Further recommendations will depend on blood results.  She will continue with antihypertensive therapy. 4. She will continue with desiccated NP thyroid for her symptoms of thyroid deficiency.  She will continue to work on nutrition to help her lose visceral fat which will in turn help her insulin resistance and PCOS. 5. Follow-up in 2 months time. 6. Today I spent 30 minutes with the patient reviewing all her admission results regarding her pulmonary embolism and reviewing with her bioidentical hormone therapy.   No orders of the defined types were placed in this encounter.   Doree Albee, MD

## 2019-12-05 LAB — COMPLETE METABOLIC PANEL WITH GFR
AG Ratio: 1.5 (calc) (ref 1.0–2.5)
ALT: 9 U/L (ref 6–29)
AST: 11 U/L (ref 10–35)
Albumin: 4.4 g/dL (ref 3.6–5.1)
Alkaline phosphatase (APISO): 52 U/L (ref 37–153)
BUN: 19 mg/dL (ref 7–25)
CO2: 23 mmol/L (ref 20–32)
Calcium: 9.3 mg/dL (ref 8.6–10.4)
Chloride: 104 mmol/L (ref 98–110)
Creat: 0.85 mg/dL (ref 0.50–1.05)
GFR, Est African American: 88 mL/min/{1.73_m2} (ref >=60)
GFR, Est Non African American: 76 mL/min/{1.73_m2} (ref >=60)
Globulin: 2.9 g/dL (calc) (ref 1.9–3.7)
Glucose, Bld: 88 mg/dL (ref 65–99)
Potassium: 4.3 mmol/L (ref 3.5–5.3)
Sodium: 137 mmol/L (ref 135–146)
Total Bilirubin: 0.5 mg/dL (ref 0.2–1.2)
Total Protein: 7.3 g/dL (ref 6.1–8.1)

## 2019-12-05 LAB — CBC
HCT: 33.7 % — ABNORMAL LOW (ref 35.0–45.0)
Hemoglobin: 11.2 g/dL — ABNORMAL LOW (ref 11.7–15.5)
MCH: 28.7 pg (ref 27.0–33.0)
MCHC: 33.2 g/dL (ref 32.0–36.0)
MCV: 86.4 fL (ref 80.0–100.0)
MPV: 9.6 fL (ref 7.5–12.5)
Platelets: 340 10*3/uL (ref 140–400)
RBC: 3.9 10*6/uL (ref 3.80–5.10)
RDW: 14 % (ref 11.0–15.0)
WBC: 5.6 10*3/uL (ref 3.8–10.8)

## 2019-12-05 LAB — PROGESTERONE: Progesterone: 32.2 ng/mL

## 2019-12-05 LAB — ESTRADIOL: Estradiol: 63 pg/mL

## 2019-12-06 MED FILL — ELIQUIS 5 MG TABLET: 5 | 15 days supply | Qty: 30 | Fill #2

## 2019-12-11 ENCOUNTER — Other Ambulatory Visit (INDEPENDENT_AMBULATORY_CARE_PROVIDER_SITE_OTHER): Payer: Self-pay | Admitting: Internal Medicine

## 2019-12-11 DIAGNOSIS — F411 Generalized anxiety disorder: Secondary | ICD-10-CM

## 2019-12-11 MED FILL — ALPRAZolam 0.25 MG TABS: 0.25 | 30 days supply | Qty: 30 | Fill #0

## 2019-12-25 ENCOUNTER — Encounter: Payer: Self-pay | Admitting: Internal Medicine

## 2019-12-25 ENCOUNTER — Other Ambulatory Visit: Payer: Self-pay

## 2019-12-25 ENCOUNTER — Other Ambulatory Visit (INDEPENDENT_AMBULATORY_CARE_PROVIDER_SITE_OTHER): Payer: Self-pay | Admitting: Internal Medicine

## 2019-12-25 ENCOUNTER — Ambulatory Visit (INDEPENDENT_AMBULATORY_CARE_PROVIDER_SITE_OTHER): Payer: 59 | Admitting: Internal Medicine

## 2019-12-25 ENCOUNTER — Ambulatory Visit (INDEPENDENT_AMBULATORY_CARE_PROVIDER_SITE_OTHER): Payer: 59

## 2019-12-25 DIAGNOSIS — I2699 Other pulmonary embolism without acute cor pulmonale: Secondary | ICD-10-CM

## 2019-12-25 DIAGNOSIS — Z86711 Personal history of pulmonary embolism: Secondary | ICD-10-CM | POA: Diagnosis not present

## 2019-12-25 MED FILL — NP THYROID 120 MG TABLET: 120 | 30 days supply | Qty: 30 | Fill #2

## 2019-12-25 MED FILL — OLMESARTAN-HCTZ 40-12.5 MG: 40-12.5 | 30 days supply | Qty: 15 | Fill #0

## 2019-12-25 MED FILL — ESTRADIOL 1 MG TABS: 1 | 30 days supply | Qty: 30 | Fill #3

## 2019-12-25 MED FILL — PROGESTERONE MICRONIZED 200: 200 | 30 days supply | Qty: 60 | Fill #1

## 2019-12-25 NOTE — Assessment & Plan Note (Signed)
Pos fm hx Prot S def/ on HRT  - onset covid symptoms 09/20/19 > admit 2/3 rx eliquis   Likely this was a provoked event but strongly suspect she has Prot S def as well therefore rec;  Minimum of 3 months rx then check venous dopplers and Echo and if nl then consider change over to prophylactic dose vs continue max rx and will stop DOAC one week prior to hematology eval to get accurate hypercoagulability profile   Discussed in detail all the  indications, usual  risks and alternatives  relative to the benefits with patient who agrees to proceed with w/u as outlined.             Each maintenance medication was reviewed in detail including emphasizing most importantly the difference between maintenance and prns and under what circumstances the prns are to be triggered using an action plan format where appropriate.  Total time for H and P, chart review, counseling,  and generating customized AVS unique to this office visit / charting = 45 min

## 2019-12-25 NOTE — Progress Notes (Signed)
[name], female    DOB: 1960/11/16     MRN: [id number]   Brief patient profile:  62 yobf quit smoking 1997 secretary for case managers at [employer]  mostly healthy last [place] with wt 170  x hbp/ menopause and took [place] Jan 7th and started feeling bad on Jan  8th fatigue never improved dx covid Pos PCR 1/13  then onset L cp /sob end of Jan 2021 then Feb 3rd admit:  Admit date: 10/16/2019 Discharge date: 10/17/2019   Hospital Summary  This is a 59 year old female with past medical history of hypertension, anxiety,  COVID-19 infection on 09/20/2019 who presented to [facility] with chest pain and shortness of breath and admitted on 10/16/2019 due to acute PE, suspected provoked in setting of recent COVID-19 infection.  She was initially started on heparin drip which was transitioned to Eliquis prior to discharge.  Patient did not require oxygen at rest or ambulation prior to discharge  A & P   Principal Problem:   Acute pulmonary embolism ([place]) Active Problems:   Anxiety state   Essential hypertension   Hypokalemia   1. Acute pulmonary embolism without evidence of right heart strain, suspected provoked in setting of recent COVID 19 infection 1. CTA chest: Marked severity bilateral PE involving predominantly Upper lobe and lower lobe branches of left pulmonary artery, moderate to marked severity left lower lobe infiltrate 2. Initially on heparin drip 3. Transitioned to eliquis at discharge for at least 3 months, reevaluate outpatient 4. Tolerating room air at rest and ambulation 5. Incentive spirometry at discharge 2. Left lower lobe infiltrate 1. Afebrile and mild leukocytosis (likely reactive from PE) 2. Most likely atelectasis, unlikely infectious 3. Incentive spirometry 3. Noncardiac left chest/flank pain secondary to PE 1. Tramadol did not help 2. Resolved with Toradol x 1 3. Discharged with short term Ibuprofen 4. Hypokalemia 1. Given IV repletion 2. Holding Benicar at  discharge 3. Follow up outpatient BMP 5. Hypomagnesemia 1. Holding Benicar 2. Discharged on short term Mag Ox 3. Follow up Mg 6. Hypertension 1. BP has been soft during hospitalization 2. Holding antihypertensive at discharge, reevaluate outpatient 7. Anxiety 1. Continue home Xanax 8. Recent COVID 19 infection 1. Dx 09/20/19, respiratory symptoms resolved prior to admission so patient was not on COVID precautions and did not get retested on admission as within 3 months since positive 2. Likely led to PE, as above     History of Present Illness  12/25/2019  Pulmonary/ 1st office eval/Shaneika Rossa  - mother and her brother are Protein S deficient.  Chief Complaint  Patient presents with  . Pulmonary Consult    Referred by Dr. Octavio Manns. Pt states was dxed with PE 10/16/19- went to ED due to having right side pain. She denies any respirtory co's.   Dyspnea:  Back to nl but not back to doing aerobics  Cough: no Sleep: able to lie flat  SABA use: none   No obvious day to day or daytime variability or assoc excess/ purulent sputum or mucus plugs or hemoptysis or cp or chest tightness, subjective wheeze or overt sinus or hb symptoms.   Sleeping  without nocturnal  or early am exacerbation  of respiratory  c/o's or need for noct saba. Also denies any obvious fluctuation of symptoms with weather or environmental changes or other aggravating or alleviating factors except as outlined above   No unusual exposure hx or h/o childhood pna/ asthma or knowledge of premature birth.  Current  Allergies, Complete Past Medical History, Past Surgical History, Family History, and Social History were reviewed in [employer] record.  ROS  The following are not active complaints unless bolded Hoarseness, sore throat, dysphagia, dental problems, itching, sneezing,  nasal congestion or discharge of excess mucus or purulent secretions, ear ache,   fever, chills, sweats, unintended wt loss or wt  gain, classically pleuritic or exertional cp,  orthopnea pnd or arm/hand swelling  or leg swelling, presyncope, palpitations, abdominal pain, anorexia, nausea, vomiting, diarrhea  or change in bowel habits or change in bladder habits, change in stools or change in urine, dysuria, hematuria,  rash, arthralgias, visual complaints, headache, numbness, weakness or ataxia or problems with walking or coordination,  change in mood or  memory.            Past Medical History:  Diagnosis Date  . Anxiety state, unspecified   . DEPRESSION   . Hypertension   . Obesity (BMI 35.0-39.9 without comorbidity) 05/16/2019  . PCOS (polycystic ovarian syndrome) 05/16/2019  . Sinus tachycardia 09/10/2015  . Tear of MCL (medial collateral ligament) of knee 11/2012   left knee  . Vitamin D deficiency disease 05/16/2019    Outpatient Medications Prior to Visit  Medication Sig Dispense Refill  . ALPRAZolam (XANAX) 0.25 MG tablet TAKE 1 TABLET BY MOUTH ONCE DAILY AT BEDTIME AS NEEDED FOR ANXIETY 30 tablet 0  . apixaban (ELIQUIS) 5 MG TABS tablet Take 1 tablet (5 mg total) by mouth 2 (two) times daily. 150 tablet 0  . estradiol (ESTRACE) 1 MG tablet Take 1 mg by mouth daily.    . magnesium oxide (MAG-OX) 400 MG tablet Take 800 mg by mouth daily.    . NP THYROID 120 MG tablet Take 1 tablet (120 mg total) by mouth daily. 30 tablet 3  . olmesartan-hydrochlorothiazide (BENICAR HCT) 40-12.5 MG tablet TAKE 1/2 TABLET BY MOUTH ONCE A DAY (NEEDS OFFICE VISIT) 15 tablet 0  . progesterone (PROMETRIUM) 200 MG capsule Take 400 mg by mouth daily.        Objective:     BP 114/74 (BP Location: Left Arm, Cuff Size: Normal)   Pulse 78   Temp 98 F (36.7 C) (Temporal)   Ht 5\' 6"  (1.676 m)   Wt 206 lb 12.8 oz (93.8 kg)   LMP 06/18/2015   SpO2 100% Comment: on RA  BMI 33.38 kg/m   SpO2: 100 %(on RA)   amb pleasant bf nad   HEENT : pt wearing mask not removed for exam due to covid -19 concerns.    NECK :  without  JVD/Nodes/TM/ nl carotid upstrokes bilaterally   LUNGS: no acc muscle use,  Nl contour chest which is clear to A and P bilaterally without cough on insp or exp maneuvers   CV:  RRR  no s3 or murmur or increase in P2, and no edema   ABD:  soft and nontender with nl inspiratory excursion in the supine position. No bruits or organomegaly appreciated, bowel sounds nl  MS:  Nl gait/ ext warm without deformities, calf tenderness, cyanosis or clubbing No obvious joint restrictions   SKIN: warm and dry without lesions    NEURO:  alert, approp, nl sensorium with  no motor or cerebellar deficits apparent.    CXR PA and Lateral:   12/25/2019 :    I personally reviewed images and agree with radiology impression as follows:     No acute cardiopulmonary findings.    Assessment  Acute pulmonary embolism ([facility]) Pos fm hx Prot S def/ on HRT  - onset covid symptoms 09/20/19 > admit 2/3 rx eliquis   Likely this was a provoked event but strongly suspect she has Prot S def as well therefore rec;  Minimum of 3 months rx then check venous dopplers and Echo and if nl then consider change over to prophylactic dose vs continue max rx and will stop DOAC one week prior to hematology eval to get accurate hypercoagulability profile   Discussed in detail all the  indications, usual  risks and alternatives  relative to the benefits with patient who agrees to proceed with w/u as outlined.             Each maintenance medication was reviewed in detail including emphasizing most importantly the difference between maintenance and prns and under what circumstances the prns are to be triggered using an action plan format where appropriate.  Total time for H and P, chart review, counseling,  and generating customized AVS unique to this office visit / charting = 45 min          Christinia Gully, MD 12/25/2019

## 2019-12-25 NOTE — Patient Instructions (Addendum)
Gradually increase your exercise = short of breath not out of breath   We will set you up for Echocardiogram and Venous dopplers same day first week in May 2021  and call you with the results prior to considering stopping Eliquis - do not want you off for more than a week prior to seeing the hematologist.

## 2019-12-26 NOTE — Progress Notes (Signed)
Spoke with pt and notified of results per Dr. Wert. Pt verbalized understanding and denied any questions. 

## 2019-12-27 ENCOUNTER — Telehealth: Payer: Self-pay | Admitting: Internal Medicine

## 2019-12-27 MED ORDER — APIXABAN 5 MG PO TABS: 5.0000 mg | ORAL_TABLET | Freq: Two times a day (BID) | ORAL | 1 refills | Status: DC

## 2019-12-27 MED FILL — ELIQUIS 5 MG TABLET: 5 | 30 days supply | Qty: 60 | Fill #0

## 2019-12-27 NOTE — Telephone Encounter (Signed)
Spoke with patient. She stated that she has not been able to get her Eliquis refilled. The last RX was sent in by one of the doctors at the hospital and he has not responded to her refill request. She has now missed almost 3 days. She was told by MW if she had any issues with getting her refills to call our office.   I went over her RX with her, she verified that she is getting a 30 day supply each time. She wishes to have this RX sent to [facility] outpatient pharmacy. Advised her that I would go ahead and send this in for her. She verbalized understanding.   Nothing further needed at time of call.

## 2020-01-08 ENCOUNTER — Other Ambulatory Visit: Payer: Self-pay | Admitting: Internal Medicine

## 2020-01-08 DIAGNOSIS — I2699 Other pulmonary embolism without acute cor pulmonale: Secondary | ICD-10-CM

## 2020-01-10 ENCOUNTER — Other Ambulatory Visit: Payer: Self-pay

## 2020-01-10 ENCOUNTER — Ambulatory Visit (HOSPITAL_COMMUNITY)
Admission: RE | Admit: 2020-01-10 | Discharge: 2020-01-10 | Disposition: A | Discharge status: Home / Self Care | Payer: 59 | Source: Ambulatory Visit | Attending: Cardiovascular Disease | Admitting: Cardiovascular Disease

## 2020-01-10 DIAGNOSIS — I2699 Other pulmonary embolism without acute cor pulmonale: Secondary | ICD-10-CM | POA: Diagnosis not present

## 2020-01-13 NOTE — Progress Notes (Signed)
Spoke with pt and notified of results per Dr. Wert. Pt verbalized understanding and denied any questions. 

## 2020-01-23 ENCOUNTER — Other Ambulatory Visit (INDEPENDENT_AMBULATORY_CARE_PROVIDER_SITE_OTHER): Payer: Self-pay | Admitting: Internal Medicine

## 2020-01-27 ENCOUNTER — Ambulatory Visit (HOSPITAL_COMMUNITY): Payer: 59 | Attending: Cardiology

## 2020-01-27 DIAGNOSIS — I2699 Other pulmonary embolism without acute cor pulmonale: Secondary | ICD-10-CM | POA: Diagnosis not present

## 2020-01-27 MED FILL — ELIQUIS 5 MG TABLET: 5 | 30 days supply | Qty: 60 | Fill #1

## 2020-01-28 ENCOUNTER — Other Ambulatory Visit: Payer: Self-pay | Admitting: Internal Medicine

## 2020-01-28 DIAGNOSIS — I2699 Other pulmonary embolism without acute cor pulmonale: Secondary | ICD-10-CM

## 2020-01-28 MED FILL — PROGESTERONE 200 MG CAPS: 200 | 30 days supply | Qty: 60 | Fill #2

## 2020-01-28 NOTE — Progress Notes (Signed)
Referral was made.

## 2020-02-05 ENCOUNTER — Ambulatory Visit (INDEPENDENT_AMBULATORY_CARE_PROVIDER_SITE_OTHER): Payer: 59 | Admitting: Internal Medicine

## 2020-02-05 ENCOUNTER — Telehealth: Payer: Self-pay | Admitting: Hematology and Oncology

## 2020-02-05 NOTE — Telephone Encounter (Signed)
Received a new hem referral from Dr. Melvyn Novas for Acute pulmonary embolism. MS. [name] has been cld and scheduled to see Dr. Lorenso Courier on 6/7 at 2pm. Pt aware to arrive 15 minutes early.

## 2020-02-17 ENCOUNTER — Inpatient Hospital Stay: Payer: 59 | Admitting: Hematology and Oncology

## 2020-02-17 ENCOUNTER — Telehealth: Payer: Self-pay | Admitting: Hematology and Oncology

## 2020-02-17 ENCOUNTER — Inpatient Hospital Stay: Payer: 59

## 2020-02-17 NOTE — Telephone Encounter (Signed)
Pt cld to cancel her appt w/Dr. Lorenso Courier today due to death in the family. She will call me back when she's ready to schedule.

## 2020-02-26 MED FILL — ESTRADIOL 1 MG TABS: 1 | 30 days supply | Qty: 30 | Fill #1

## 2020-02-26 MED FILL — OLMESARTAN-HCTZ 40-12.5 MG: 40-12.5 | 30 days supply | Qty: 15 | Fill #2

## 2020-03-05 ENCOUNTER — Other Ambulatory Visit (INDEPENDENT_AMBULATORY_CARE_PROVIDER_SITE_OTHER): Payer: Self-pay | Admitting: Internal Medicine

## 2020-03-05 ENCOUNTER — Other Ambulatory Visit: Payer: Self-pay | Admitting: Internal Medicine

## 2020-03-05 DIAGNOSIS — E282 Polycystic ovarian syndrome: Secondary | ICD-10-CM

## 2020-03-05 MED FILL — ELIQUIS 5 MG TABLET: 5 | 30 days supply | Qty: 60 | Fill #0

## 2020-03-05 NOTE — Telephone Encounter (Signed)
Ok x one month then needs ov with NP to sort out

## 2020-03-05 NOTE — Telephone Encounter (Signed)
Dr Melvyn Novas- do you want to refill her Eliquis- she cancelled the appt with hematologist and has not rescheduled.

## 2020-03-17 ENCOUNTER — Other Ambulatory Visit (INDEPENDENT_AMBULATORY_CARE_PROVIDER_SITE_OTHER): Payer: Self-pay | Admitting: Internal Medicine

## 2020-03-17 DIAGNOSIS — E282 Polycystic ovarian syndrome: Secondary | ICD-10-CM

## 2020-03-17 MED ORDER — NP THYROID 120 MG PO TABS: 120.0000 mg | ORAL_TABLET | Freq: Every day | ORAL | 3 refills | Status: DC

## 2020-03-17 MED ORDER — ARMOUR THYROID 120 MG PO TABS: 120.0000 mg | ORAL_TABLET | Freq: Every day | ORAL | 3 refills | Status: DC

## 2020-03-17 MED FILL — ARMOUR THYROID 120 MG TAB: 120 | 30 days supply | Qty: 30 | Fill #0

## 2020-03-17 NOTE — Telephone Encounter (Signed)
Patient requesting refill of her NP thyroid. 120mg  by mouth daily

## 2020-03-19 MED FILL — PROGESTERONE 200 MG CAPS: 200 | 30 days supply | Qty: 60 | Fill #3

## 2020-03-23 ENCOUNTER — Other Ambulatory Visit (INDEPENDENT_AMBULATORY_CARE_PROVIDER_SITE_OTHER): Payer: Self-pay | Admitting: Nurse Practitioner

## 2020-03-23 MED FILL — OLMESARTAN-HCTZ 40-12.5 MG: 40-12.5 | 30 days supply | Qty: 15 | Fill #0

## 2020-03-30 ENCOUNTER — Telehealth: Payer: Self-pay | Admitting: Internal Medicine

## 2020-03-30 NOTE — Telephone Encounter (Signed)
Spoke with pt, verified that she is still taking Eliquis BID.  Pt aware of MW's recs.  Nothing further needed at this time- will close encounter.

## 2020-03-30 NOTE — Telephone Encounter (Signed)
Called and spoke with [name]. [name] stated she was having back pain, or pinch. [name] stated she was now having right shoulder pain. [name] stated she thought it may be related to recent use of weights during exercise.  [name] stated ED was full and recommended she call Dr Melvyn Novas and have xray at office today.   [name] stated she is currently at Urgent Care, since it was after hours, to request xray. Advised [name] to call 03/31/20, if needed for NP or Dr Melvyn Novas. Understanding stated.  Nothing further at this time.

## 2020-03-30 NOTE — Telephone Encounter (Signed)
Spoke with pt, c/o worsening pain under R shoulder blade when laying down.  Pain has progressively worsened X 2 weeks.  Pt denies any fever, injury to area, cough, sob, hemoptysis, any other symptoms.    Pt has not taken any meds/used heating pads/anything to help alleviate pain.    Pt requesting additional recs.  MW please advise.  Thanks!

## 2020-03-30 NOTE — Telephone Encounter (Signed)
Agree she needs to go to ER and check this out  ? Is she still on full dose eliquis ?    > either way needs to go now

## 2020-04-01 ENCOUNTER — Encounter (HOSPITAL_COMMUNITY): Payer: Self-pay | Admitting: Emergency Medicine

## 2020-04-01 ENCOUNTER — Telehealth (INDEPENDENT_AMBULATORY_CARE_PROVIDER_SITE_OTHER): Payer: Self-pay

## 2020-04-01 ENCOUNTER — Emergency Department (HOSPITAL_COMMUNITY): Payer: 59

## 2020-04-01 ENCOUNTER — Other Ambulatory Visit: Payer: Self-pay

## 2020-04-01 ENCOUNTER — Emergency Department (HOSPITAL_COMMUNITY)
Admission: EM | Admit: 2020-04-01 | Discharge: 2020-04-01 | Disposition: A | Discharge status: Home / Self Care | Payer: 59 | Attending: Emergency Medicine | Admitting: Emergency Medicine

## 2020-04-01 DIAGNOSIS — I10 Essential (primary) hypertension: Secondary | ICD-10-CM | POA: Insufficient documentation

## 2020-04-01 DIAGNOSIS — Z87891 Personal history of nicotine dependence: Secondary | ICD-10-CM | POA: Insufficient documentation

## 2020-04-01 DIAGNOSIS — Z7901 Long term (current) use of anticoagulants: Secondary | ICD-10-CM | POA: Diagnosis not present

## 2020-04-01 DIAGNOSIS — R05 Cough: Secondary | ICD-10-CM | POA: Diagnosis not present

## 2020-04-01 DIAGNOSIS — Z86711 Personal history of pulmonary embolism: Secondary | ICD-10-CM | POA: Diagnosis not present

## 2020-04-01 DIAGNOSIS — M546 Pain in thoracic spine: Secondary | ICD-10-CM | POA: Insufficient documentation

## 2020-04-01 DIAGNOSIS — R0781 Pleurodynia: Secondary | ICD-10-CM | POA: Diagnosis not present

## 2020-04-01 DIAGNOSIS — Z79899 Other long term (current) drug therapy: Secondary | ICD-10-CM | POA: Diagnosis not present

## 2020-04-01 DIAGNOSIS — R072 Precordial pain: Secondary | ICD-10-CM | POA: Diagnosis present

## 2020-04-01 DIAGNOSIS — R0789 Other chest pain: Secondary | ICD-10-CM | POA: Diagnosis not present

## 2020-04-01 DIAGNOSIS — R918 Other nonspecific abnormal finding of lung field: Secondary | ICD-10-CM | POA: Diagnosis not present

## 2020-04-01 LAB — CBC
HCT: 41.4 % (ref 36.0–46.0)
Hemoglobin: 13 g/dL (ref 12.0–15.0)
MCH: 29 pg (ref 26.0–34.0)
MCHC: 31.4 g/dL (ref 30.0–36.0)
MCV: 92.2 fL (ref 80.0–100.0)
Platelets: 252 10*3/uL (ref 150–400)
RBC: 4.49 MIL/uL (ref 3.87–5.11)
RDW: 14.1 % (ref 11.5–15.5)
WBC: 7.1 10*3/uL (ref 4.0–10.5)
nRBC: 0 % (ref 0.0–0.2)

## 2020-04-01 LAB — BASIC METABOLIC PANEL
Anion gap: 10 (ref 5–15)
BUN: 16 mg/dL (ref 6–20)
CO2: 23 mmol/L (ref 22–32)
Calcium: 9.1 mg/dL (ref 8.9–10.3)
Chloride: 107 mmol/L (ref 98–111)
Creatinine, Ser: 0.92 mg/dL (ref 0.44–1.00)
GFR calc Af Amer: >60 mL/min (ref >60)
GFR calc non Af Amer: >60 mL/min (ref >60)
Glucose, Bld: 90 mg/dL (ref 70–99)
Potassium: 3.9 mmol/L (ref 3.5–5.1)
Sodium: 140 mmol/L (ref 135–145)

## 2020-04-01 LAB — TROPONIN I (HIGH SENSITIVITY): Troponin I (High Sensitivity): 3 ng/L (ref <18)

## 2020-04-01 LAB — D-DIMER, QUANTITATIVE: D-Dimer, Quant: <0.27 ug/mL-FEU (ref 0.00–0.50)

## 2020-04-01 MED ORDER — IOHEXOL 350 MG/ML SOLN
100.0000 mL | Freq: Once | INTRAVENOUS | Status: AC | PRN
  Administered 2020-04-01: 62 mL via INTRAVENOUS

## 2020-04-01 MED ORDER — SODIUM CHLORIDE 0.9% FLUSH: 3.0000 mL | Freq: Once | INTRAVENOUS | Status: DC

## 2020-04-01 NOTE — Telephone Encounter (Signed)
Pt said thank she will held down to ER now. She is on [facility] campus working.

## 2020-04-01 NOTE — ED Notes (Signed)
Attempted IV without success. IV team consult placed for CT Angio.

## 2020-04-01 NOTE — Telephone Encounter (Signed)
We cannot order this expensive test without her being evaluated.  She will definitely need to go to the emergency room if she is concerned about blood clots.  I will not be able to see her in the office because of her cough and the possibility of COVID-19.  So she has no choice but to go to the emergency room.

## 2020-04-01 NOTE — ED Triage Notes (Signed)
Pt complaint of right ribcage/chest pain radiating from her back. Hx of PE. "Pain worsening over the past week."

## 2020-04-01 NOTE — Discharge Instructions (Addendum)
Please follow-up with your pulmonologist as well as her primary care provider.  Please return to the emergency department with any new or worsening symptoms.  It was a pleasure to meet you.

## 2020-04-01 NOTE — Telephone Encounter (Signed)
Under on rt side shoulder pain, and bad cough. Pt wants to make sure she does not have have a blood clot. Pt stated "my  mom just receivedCOVID vaccine over a  month ago. She kept getting sick, then found out she had  blood clots showing up. Pt mom  passed away in few weeks ago. Just want to make sure she is not getting them also, since last hospital stay & mom's condition. She went to urgent care, just needs a order to go get imaging completed.

## 2020-04-01 NOTE — ED Notes (Signed)
Pt to CT

## 2020-04-01 NOTE — ED Provider Notes (Signed)
[facility] DEPT Provider Note   CSN: [id number] Arrival date & time: 04/01/20  1211     History Chief Complaint  Patient presents with  . Chest Pain    [name] is a 59 y.o. female.  HPI   Patient is a 59 year old female with a medical history as noted below.  She was diagnosed with acute pulmonary embolism in February of this year and currently takes Eliquis.  She states she is compliant with this medication.  About 1 week ago she began experiencing mild right thoracic pain that radiates to the right axillary region.  This pain worsens significantly when lying flat.  About 3 to 4 days ago she also began experiencing chest heaviness in the sternal region that presents and worsens when lying flat as well.  She states that the symptoms are consistent with her prior PE in February.  She denies any anterior chest wall pain.  No shortness of breath.  She denies fevers, chills, URI symptoms, abdominal pain, nausea, vomiting, diarrhea, syncope, dizziness, urinary changes.     Past Medical History:  Diagnosis Date  . Anxiety state, unspecified   . DEPRESSION   . Hypertension   . Obesity (BMI 35.0-39.9 without comorbidity) 05/16/2019  . PCOS (polycystic ovarian syndrome) 05/16/2019  . Sinus tachycardia 09/10/2015  . Tear of MCL (medial collateral ligament) of knee 11/2012   left knee  . Vitamin D deficiency disease 05/16/2019    Patient Active Problem List   Diagnosis Date Noted  . Hypomagnesemia 10/29/2019  . Hypokalemia 10/17/2019  . Acute pulmonary embolism ([place]) 10/16/2019  . PCOS (polycystic ovarian syndrome) 05/16/2019  . Vitamin D deficiency disease 05/16/2019  . Obesity (BMI 35.0-39.9 without comorbidity) 05/16/2019  . Sinus tachycardia 09/10/2015  . Hyperglycemia 06/22/2015  . Posterior tibialis tendon insufficiency 04/16/2015  . Headache 07/30/2014  . Primary localized osteoarthrosis, lower leg 01/31/2014  . ALLERGIC RHINITIS 11/25/2010   . Essential hypertension 07/21/2010  . Anxiety state 05/19/2010  . DEPRESSION 05/19/2010    Past Surgical History:  Procedure Laterality Date  . COLONOSCOPY  2015  . DILATATION & CURRETTAGE/HYSTEROSCOPY WITH RESECTOCOPE  08-11-2004   removal polyps  . HAMMER TOE SURGERY Bilateral 03-22-2006   fifth toe  . KNEE ARTHROSCOPY WITH MEDIAL COLLATERAL LIGAMENT RECONSTRUCTION Left 12/18/2012   Procedure: KNEE ARTHROSCOPY WITH OPEN MEDIAL COLLATERAL LIGAMENT RECONSTRUCTION WITH ALLOGRAFT,ANTERIOR CRUCIATE LIGAMENT RESCONSTRUCTION WITH ALLOGRAFT, PARTIAL MEDIALMENISECTOMY/DEBRIDEMENT CHONDROPLASTY;  Surgeon: Sydnee Cabal, MD;  Location: [facility];  Service: Orthopedics;  Laterality: Left;     OB History   No obstetric history on file.     Family History  Problem Relation Age of Onset  . Hypertension Mother   . Cancer Mother        Kidney cancer  . Hypertension Father   . Diabetes Maternal Grandmother        amputation  . Colon cancer Neg Hx   . Rectal cancer Neg Hx   . Stomach cancer Neg Hx     Social History   Tobacco Use  . Smoking status: Former Smoker    Packs/day: 0.25    Years: 6.00    Pack years: 1.50    Types: Cigarettes    Quit date: 09/13/1995    Years since quitting: 24.5  . Smokeless tobacco: Never Used  . Tobacco comment: smoked socially on the weekends  Vaping Use  . Vaping Use: Never used  Substance Use Topics  . Alcohol use: Yes  Comment: White wine   . Drug use: No    Home Medications Prior to Admission medications   Medication Sig Start Date End Date Taking? Authorizing Provider  ALPRAZolam (XANAX) 0.25 MG tablet TAKE 1 TABLET BY MOUTH ONCE DAILY AT BEDTIME AS NEEDED FOR ANXIETY 12/11/19   Doree Albee, MD  ARMOUR THYROID 120 MG tablet Take 1 tablet (120 mg total) by mouth daily before breakfast. 03/17/20   Doree Albee, MD  ELIQUIS 5 MG TABS tablet TAKE 1 TABLET BY MOUTH TWO TIMES DAILY 03/05/20   Tanda Rockers, MD    estradiol (ESTRACE) 1 MG tablet TAKE 1 TABLET DAILY BY MOUTH 01/23/20   Gosrani, Nimish C, MD  magnesium oxide (MAG-OX) 400 MG tablet Take 800 mg by mouth daily.    [provider]  NP THYROID 120 MG tablet TAKE 1 TABLET BY MOUTH ONCE DAILY 03/17/20   Gosrani, Nimish C, MD  olmesartan-hydrochlorothiazide (BENICAR HCT) 40-12.5 MG tablet TAKE 1/2 TABLET BY MOUTH DAILY 03/23/20   Hurshel Party C, MD  progesterone (PROMETRIUM) 200 MG capsule Take 400 mg by mouth daily.    [provider]    Allergies    Oxycodone-acetaminophen  Review of Systems   Review of Systems  All other systems reviewed and are negative.  Ten systems reviewed and are negative for acute change, except as noted in the HPI.   Physical Exam Updated Vital Signs BP (!) 156/94   Pulse 78   Temp 98.7 F (37.1 C) (Oral)   Resp 18   Ht 5\' 7"  (1.702 m)   Wt 96 kg   LMP 06/18/2015   SpO2 100%   BMI 33.16 kg/m   Physical Exam Vitals and nursing note reviewed.  Constitutional:      General: She is not in acute distress.    Appearance: Normal appearance. She is not ill-appearing, toxic-appearing or diaphoretic.  HENT:     Head: Normocephalic and atraumatic.     Right Ear: External ear normal.     Left Ear: External ear normal.     Nose: Nose normal.     Mouth/Throat:     Mouth: Mucous membranes are moist.     Pharynx: Oropharynx is clear. No oropharyngeal exudate or posterior oropharyngeal erythema.  Eyes:     Extraocular Movements: Extraocular movements intact.  Cardiovascular:     Rate and Rhythm: Normal rate and regular rhythm.     Pulses: Normal pulses.          Radial pulses are 2+ on the right side and 2+ on the left side.       Dorsalis pedis pulses are 2+ on the right side and 2+ on the left side.     Heart sounds: Normal heart sounds. No murmur heard.  No systolic murmur is present.  No diastolic murmur is present.  No friction rub. No gallop.      Comments: Heart is regular rate and  rhythm.  Patient is not tachycardic. Pulmonary:     Effort: Pulmonary effort is normal. No tachypnea, accessory muscle usage or respiratory distress.     Breath sounds: Normal breath sounds. No stridor. No decreased breath sounds, wheezing, rhonchi or rales.  Chest:     Chest wall: No deformity, tenderness or crepitus.     Comments: No tenderness appreciated along the anterior chest wall.  No tenderness appreciated throughout the upper back. Abdominal:     General: Abdomen is flat.     Palpations: Abdomen  is soft.     Tenderness: There is no abdominal tenderness.  Musculoskeletal:        General: Normal range of motion.     Cervical back: Normal range of motion and neck supple. No tenderness.     Right lower leg: No tenderness. No edema.     Left lower leg: No tenderness. No edema.     Comments: No edema appreciated in the bilateral lower extremities.  No calf tenderness.  Negative Homans' sign.  Skin:    General: Skin is warm and dry.  Neurological:     General: No focal deficit present.     Mental Status: She is alert and oriented to person, place, and time.  Psychiatric:        Mood and Affect: Mood normal.        Behavior: Behavior normal.    ED Results / Procedures / Treatments   Labs (all labs ordered are listed, but only abnormal results are displayed) Labs Reviewed  BASIC METABOLIC PANEL  CBC  D-DIMER, QUANTITATIVE (NOT AT [facility])  TROPONIN I (HIGH SENSITIVITY)   EKG None  Radiology CT Angio Chest PE W and/or Wo Contrast  Result Date: 04/01/2020 CLINICAL DATA:  Right pleuritic chest pain, cough, history of pulmonary embolism EXAM: CT ANGIOGRAPHY CHEST WITH CONTRAST TECHNIQUE: Multidetector CT imaging of the chest was performed using the standard protocol during bolus administration of intravenous contrast. Multiplanar CT image reconstructions and MIPs were obtained to evaluate the vascular anatomy. CONTRAST:  52mL OMNIPAQUE IOHEXOL 350 MG/ML SOLN COMPARISON:   10/16/2019 FINDINGS: Cardiovascular: Satisfactory opacification of the pulmonary arteries to the segmental level. No evidence of pulmonary embolism. Previously noted intraluminal thrombus has resolved. Central pulmonary arterial caliber is normal. Thoracic aorta is unremarkable. Normal heart size. No pericardial effusion. Mediastinum/Nodes: No pathologic thoracic adenopathy. Lungs/Pleura: Lungs are clear. No pneumothorax or pleural effusion. Central airways are widely patent. Upper Abdomen: Unremarkable Musculoskeletal: No acute bone abnormality. Review of the MIP images confirms the above findings. IMPRESSION: No pulmonary embolism. Normal examination of the chest. No radiographic explanation for the patient's reported chest pain. Resolved intraluminal thrombus and left basilar pulmonary infiltrate noted on a prior examination. Electronically Signed   By: Fidela Salisbury MD   On: 04/01/2020 16:52   Procedures Procedures (including critical care time)  Medications Ordered in ED Medications  sodium chloride flush (NS) 0.9 % injection 3 mL (3 mLs Intravenous Not Given 04/01/20 1407)  iohexol (OMNIPAQUE) 350 MG/ML injection 100 mL (62 mLs Intravenous Contrast Given 04/01/20 1619)    ED Course  I have reviewed the triage vital signs and the nursing notes.  Pertinent labs & imaging results that were available during my care of the patient were reviewed by me and considered in my medical decision making (see chart for details).  Clinical Course as of Apr 01 1709  Wed Apr 01, 2020  1351 Not tachycardic  Pulse Rate: 78 [LJ]  1351 Not hypoxic  SpO2: 100 % [LJ]  1458 D-Dimer, Quant: <0.27 [LJ]  1459 Troponin I (High Sensitivity): 3 [LJ]  1459 Creatinine: 0.92 [LJ]  1709 No pulmonary embolism. Normal examination of the chest.   CT Angio Chest PE W and/or Wo Contrast [LJ]    Clinical Course User Index [LJ] Rayna Sexton, PA-C   MDM Rules/Calculators/A&P                          Pt is a 59 y.o.  female that  present with a history, physical exam, ED Clinical Course as noted above.   Patient presents today due to right-sided thoracic back pain.  No palpable pain on the exam.  No crepitus.  Physical exam was reassuring.  She has a history of pulmonary embolism in February of this year and states her current symptoms are similar with the prior occurrence.  She is not tachycardic today.  She is not hypoxic.  No elevation in her D-dimer or troponin.  Obtained a CT scan of her chest to rule out pulmonary embolism.  This was negative.  Discussed this with the patient.  Recommended that she continue taking her prescribed Eliquis.  Recommended that she follow-up with both her PCP as well as her pulmonologist.  She understands she can return to the emergency department with any new or worsening symptoms.  Her questions were answered and she was amicable at the time of discharge.  Vital signs are stable.  Patient discharged to home/self care.  Condition at discharge: Stable  Note: Portions of this report may have been transcribed using voice recognition software. Every effort was made to ensure accuracy; however, inadvertent computerized transcription errors may be present.   Final Clinical Impression(s) / ED Diagnoses Final diagnoses:  Acute right-sided thoracic back pain   Rx / DC Orders ED Discharge Orders    None       Rayna Sexton, PA-C 04/01/20 1710    Veryl Speak, MD 04/03/20 [phone]

## 2020-04-01 NOTE — ED Notes (Signed)
Spoke with Little MD regarding pt chief complaint and hx; verbal to place ddimer.

## 2020-04-04 ENCOUNTER — Other Ambulatory Visit: Payer: Self-pay

## 2020-04-04 ENCOUNTER — Emergency Department (HOSPITAL_COMMUNITY)
Admission: EM | Admit: 2020-04-04 | Discharge: 2020-04-04 | Disposition: A | Discharge status: Home / Self Care | Payer: 59 | Attending: Emergency Medicine | Admitting: Emergency Medicine

## 2020-04-04 ENCOUNTER — Emergency Department (HOSPITAL_COMMUNITY): Payer: 59

## 2020-04-04 ENCOUNTER — Encounter (HOSPITAL_COMMUNITY): Payer: Self-pay

## 2020-04-04 DIAGNOSIS — Z79899 Other long term (current) drug therapy: Secondary | ICD-10-CM | POA: Diagnosis not present

## 2020-04-04 DIAGNOSIS — Z87891 Personal history of nicotine dependence: Secondary | ICD-10-CM | POA: Diagnosis not present

## 2020-04-04 DIAGNOSIS — K8020 Calculus of gallbladder without cholecystitis without obstruction: Secondary | ICD-10-CM | POA: Insufficient documentation

## 2020-04-04 DIAGNOSIS — M549 Dorsalgia, unspecified: Secondary | ICD-10-CM

## 2020-04-04 DIAGNOSIS — I10 Essential (primary) hypertension: Secondary | ICD-10-CM | POA: Diagnosis not present

## 2020-04-04 DIAGNOSIS — M546 Pain in thoracic spine: Secondary | ICD-10-CM | POA: Insufficient documentation

## 2020-04-04 LAB — COMPREHENSIVE METABOLIC PANEL
ALT: 15 U/L (ref 0–44)
AST: 15 U/L (ref 15–41)
Albumin: 4.4 g/dL (ref 3.5–5.0)
Alkaline Phosphatase: 56 U/L (ref 38–126)
Anion gap: 11 (ref 5–15)
BUN: 13 mg/dL (ref 6–20)
CO2: 22 mmol/L (ref 22–32)
Calcium: 9.4 mg/dL (ref 8.9–10.3)
Chloride: 108 mmol/L (ref 98–111)
Creatinine, Ser: 0.84 mg/dL (ref 0.44–1.00)
GFR calc Af Amer: >60 mL/min (ref >60)
GFR calc non Af Amer: >60 mL/min (ref >60)
Glucose, Bld: 94 mg/dL (ref 70–99)
Potassium: 3.7 mmol/L (ref 3.5–5.1)
Sodium: 141 mmol/L (ref 135–145)
Total Bilirubin: 0.6 mg/dL (ref 0.3–1.2)
Total Protein: 8.1 g/dL (ref 6.5–8.1)

## 2020-04-04 LAB — CBC WITH DIFFERENTIAL/PLATELET
Abs Immature Granulocytes: 0.03 10*3/uL (ref 0.00–0.07)
Basophils Absolute: 0 10*3/uL (ref 0.0–0.1)
Basophils Relative: 1 %
Eosinophils Absolute: 0 10*3/uL (ref 0.0–0.5)
Eosinophils Relative: 0 %
HCT: 41.4 % (ref 36.0–46.0)
Hemoglobin: 13.1 g/dL (ref 12.0–15.0)
Immature Granulocytes: 1 %
Lymphocytes Relative: 36 %
Lymphs Abs: 2.4 10*3/uL (ref 0.7–4.0)
MCH: 28.7 pg (ref 26.0–34.0)
MCHC: 31.6 g/dL (ref 30.0–36.0)
MCV: 90.6 fL (ref 80.0–100.0)
Monocytes Absolute: 0.5 10*3/uL (ref 0.1–1.0)
Monocytes Relative: 7 %
Neutro Abs: 3.7 10*3/uL (ref 1.7–7.7)
Neutrophils Relative %: 55 %
Platelets: 274 10*3/uL (ref 150–400)
RBC: 4.57 MIL/uL (ref 3.87–5.11)
RDW: 13.6 % (ref 11.5–15.5)
WBC: 6.6 10*3/uL (ref 4.0–10.5)
nRBC: 0 % (ref 0.0–0.2)

## 2020-04-04 LAB — LIPASE, BLOOD: Lipase: 31 U/L (ref 11–51)

## 2020-04-04 MED ORDER — METHOCARBAMOL 500 MG PO TABS
500.0000 mg | ORAL_TABLET | Freq: Two times a day (BID) | ORAL | 0 refills | Status: DC | PRN
Start: 2020-04-04 — End: 2020-08-04

## 2020-04-04 MED ORDER — FENTANYL CITRATE (PF) 100 MCG/2ML IJ SOLN
50.0000 ug | Freq: Once | INTRAMUSCULAR | Status: AC
  Administered 2020-04-04: 50 ug via INTRAVENOUS
  Filled 2020-04-04: qty 2

## 2020-04-04 MED ORDER — HYDROCODONE-ACETAMINOPHEN 5-325 MG PO TABS: 1.0000 | ORAL_TABLET | Freq: Four times a day (QID) | ORAL | 0 refills | Status: DC | PRN

## 2020-04-04 MED ORDER — OXYCODONE-ACETAMINOPHEN 5-325 MG PO TABS: 1.0000 | ORAL_TABLET | ORAL | Status: DC | PRN

## 2020-04-04 MED ORDER — METHOCARBAMOL 500 MG PO TABS
500.0000 mg | ORAL_TABLET | Freq: Once | ORAL | Status: AC
  Administered 2020-04-04: 500 mg via ORAL
  Filled 2020-04-04: qty 1

## 2020-04-04 MED ORDER — ONDANSETRON 4 MG PO TBDP: 4.0000 mg | ORAL_TABLET | Freq: Once | ORAL | Status: DC

## 2020-04-04 MED ORDER — METHOCARBAMOL 500 MG PO TABS: 1000.0000 mg | ORAL_TABLET | Freq: Once | ORAL | Status: DC

## 2020-04-04 MED ORDER — METHOCARBAMOL 500 MG PO TABS
500.0000 mg | ORAL_TABLET | Freq: Two times a day (BID) | ORAL | 0 refills | Status: DC | PRN
Start: 2020-04-04 — End: 2020-04-04

## 2020-04-04 NOTE — ED Notes (Signed)
An After Visit Summary was printed and given to the patient. Discharge instructions given and no further questions at this time.  

## 2020-04-04 NOTE — Discharge Instructions (Addendum)
Your work-up today showed gallstones.  This may be contributing to your back pain.  This is treated symptomatically.  Use Tylenol as needed for mild to moderate pain.  Use Norco as needed for severe breakthrough pain.  Have caution, this may make you tired or groggy.  Do not drive or operate machinery while taking this medicine.  You may also have a component of muscular pain.  Once again, Tylenol should help with this.  You may use muscle relaxers as needed for stiffness or soreness.  This may also make you tired and groggy, do not take it at the same time as the [place].  Follow-up with the general surgeon listed below for further evaluation of your gallstones.  Return to the emergency room if you develop fevers, vomiting, severe worsening abdominal pain, any new, worsening, or concerning symptoms.

## 2020-04-04 NOTE — ED Notes (Signed)
Pt ambulatory to RR w/out assistance. 

## 2020-04-04 NOTE — ED Triage Notes (Signed)
She c/o back pain which has migrated to right thoracic/shoulder areas. She states she was seen for this a few days ago at which time, among other things, MRI was performed "but it didn't show anything". She is writhing, as if in much pain. She tells me she is concerned because she "have a history of blood clots and I'm on Eliquiis twice a day".

## 2020-04-04 NOTE — ED Provider Notes (Signed)
[facility] DEPT Provider Note   CSN: [id number] Arrival date & time: 04/04/20  1540     History Chief Complaint  Patient presents with  . Generalized Body Aches    [name] is a 59 y.o. female presenting for evaluation of right side back pain.  Patient states the past 5 days, she has had severe right-sided back pain.  Is gradually worsening each day.  Pain is worse with movement of her arm and when she lays flat.  She was seen in the ER 3 days ago for the same, had a negative CTA.  She does have a history of PEs, is on Eliquis, states she is concerned this feels similar to when she had a blood clot before.  Patient states she has had intermittent nausea.  Initially, she was also having mild epigastric/substernal discomfort.  This has since resolved.  She denies fevers, chills, vomiting, abdominal pain, urinary symptoms, abnormal bowel movements.  She has never had any problems with her gallbladder before.  She is not currently taking anything for her pain including Tylenol.  Additional history obtained from chart review.  Patient with a history anxiety, depression, hypertension, obesity, PCOS, previous PE.  Additional history obtained chart reviewed.  Reviewed ER note from 3 days ago.  At that time, patient had non-reproducible right upper back pain, no ttp.  HPI     Past Medical History:  Diagnosis Date  . Anxiety state, unspecified   . DEPRESSION   . Hypertension   . Obesity (BMI 35.0-39.9 without comorbidity) 05/16/2019  . PCOS (polycystic ovarian syndrome) 05/16/2019  . Sinus tachycardia 09/10/2015  . Tear of MCL (medial collateral ligament) of knee 11/2012   left knee  . Vitamin D deficiency disease 05/16/2019    Patient Active Problem List   Diagnosis Date Noted  . Hypomagnesemia 10/29/2019  . Hypokalemia 10/17/2019  . Acute pulmonary embolism ([place]) 10/16/2019  . PCOS (polycystic ovarian syndrome) 05/16/2019  . Vitamin D deficiency  disease 05/16/2019  . Obesity (BMI 35.0-39.9 without comorbidity) 05/16/2019  . Sinus tachycardia 09/10/2015  . Hyperglycemia 06/22/2015  . Posterior tibialis tendon insufficiency 04/16/2015  . Headache 07/30/2014  . Primary localized osteoarthrosis, lower leg 01/31/2014  . ALLERGIC RHINITIS 11/25/2010  . Essential hypertension 07/21/2010  . Anxiety state 05/19/2010  . DEPRESSION 05/19/2010    Past Surgical History:  Procedure Laterality Date  . COLONOSCOPY  2015  . DILATATION & CURRETTAGE/HYSTEROSCOPY WITH RESECTOCOPE  08-11-2004   removal polyps  . HAMMER TOE SURGERY Bilateral 03-22-2006   fifth toe  . KNEE ARTHROSCOPY WITH MEDIAL COLLATERAL LIGAMENT RECONSTRUCTION Left 12/18/2012   Procedure: KNEE ARTHROSCOPY WITH OPEN MEDIAL COLLATERAL LIGAMENT RECONSTRUCTION WITH ALLOGRAFT,ANTERIOR CRUCIATE LIGAMENT RESCONSTRUCTION WITH ALLOGRAFT, PARTIAL MEDIALMENISECTOMY/DEBRIDEMENT CHONDROPLASTY;  Surgeon: Sydnee Cabal, MD;  Location: [facility];  Service: Orthopedics;  Laterality: Left;     OB History   No obstetric history on file.     Family History  Problem Relation Age of Onset  . Hypertension Mother   . Cancer Mother        Kidney cancer  . Hypertension Father   . Diabetes Maternal Grandmother        amputation  . Colon cancer Neg Hx   . Rectal cancer Neg Hx   . Stomach cancer Neg Hx     Social History   Tobacco Use  . Smoking status: Former Smoker    Packs/day: 0.25    Years: 6.00    Pack years: 1.50  Types: Cigarettes    Quit date: 09/13/1995    Years since quitting: 24.5  . Smokeless tobacco: Never Used  . Tobacco comment: smoked socially on the weekends  Vaping Use  . Vaping Use: Never used  Substance Use Topics  . Alcohol use: Yes    Comment: White wine   . Drug use: No    Home Medications Prior to Admission medications   Medication Sig Start Date End Date Taking? Authorizing Provider  ALPRAZolam (XANAX) 0.25 MG tablet TAKE 1 TABLET  BY MOUTH ONCE DAILY AT BEDTIME AS NEEDED FOR ANXIETY Patient taking differently: Take 0.25 mg by mouth at bedtime as needed for anxiety.  12/11/19  Yes Gosrani, Nimish C, MD  ELIQUIS 5 MG TABS tablet TAKE 1 TABLET BY MOUTH TWO TIMES DAILY Patient taking differently: Take 5 mg by mouth 2 (two) times daily.  03/05/20  Yes Tanda Rockers, MD  estradiol (ESTRACE) 1 MG tablet TAKE 1 TABLET DAILY BY MOUTH Patient taking differently: Take 1 mg by mouth daily.  01/23/20  Yes Gosrani, Nimish C, MD  magnesium oxide (MAG-OX) 400 MG tablet Take 800 mg by mouth daily.   Yes [provider]  NP THYROID 120 MG tablet TAKE 1 TABLET BY MOUTH ONCE DAILY Patient taking differently: Take 120 mg by mouth daily before breakfast.  03/17/20  Yes Gosrani, Nimish C, MD  olmesartan-hydrochlorothiazide (BENICAR HCT) 40-12.5 MG tablet TAKE 1/2 TABLET BY MOUTH DAILY 03/23/20  Yes Gosrani, Nimish C, MD  progesterone (PROMETRIUM) 200 MG capsule Take 400 mg by mouth every evening.  03/19/20  Yes [provider]  ARMOUR THYROID 120 MG tablet Take 1 tablet (120 mg total) by mouth daily before breakfast. Patient not taking: Reported on 04/01/2020 03/17/20   Doree Albee, MD  HYDROcodone-acetaminophen (NORCO/VICODIN) 5-325 MG tablet Take 1 tablet by mouth every 6 (six) hours as needed for severe pain. 04/04/20   Murice Barbar, PA-C  methocarbamol (ROBAXIN) 500 MG tablet Take 1 tablet (500 mg total) by mouth 2 (two) times daily as needed for muscle spasms. 04/04/20   Tobias Avitabile, PA-C  progesterone (PROMETRIUM) 200 MG capsule Take 400 mg by mouth daily. Patient not taking: Reported on 04/01/2020    [provider]    Allergies    Oxycodone-acetaminophen  Review of Systems   Review of Systems  Gastrointestinal: Positive for nausea (resolved).  Musculoskeletal: Positive for back pain.  All other systems reviewed and are negative.   Physical Exam Updated Vital Signs BP (!) 130/79   Pulse 82    Temp 99 F (37.2 C) (Oral)   Resp 18   LMP 06/18/2015   SpO2 100%   Physical Exam Vitals and nursing note reviewed.  Constitutional:      General: She is not in acute distress.    Appearance: She is well-developed.     Comments: Appears uncomfortable due to pain, otherwise nontoxic  HENT:     Head: Normocephalic and atraumatic.  Eyes:     Extraocular Movements: Extraocular movements intact.     Conjunctiva/sclera: Conjunctivae normal.     Pupils: Pupils are equal, round, and reactive to light.  Cardiovascular:     Rate and Rhythm: Normal rate and regular rhythm.     Pulses: Normal pulses.  Pulmonary:     Effort: Pulmonary effort is normal. No respiratory distress.     Breath sounds: Normal breath sounds. No wheezing.  Abdominal:     General: There is no distension.  Palpations: Abdomen is soft. There is no mass.     Tenderness: There is no abdominal tenderness. There is no guarding or rebound.     Comments: No ttp. Negative murphys.   Musculoskeletal:        General: Tenderness present. Normal range of motion.     Cervical back: Normal range of motion and neck supple.     Comments: Mild TTP of the R upper back and R side chest wall. No lesions. No signs of trauma. Pt states pain is mostly "inside," not on the outside.   Skin:    General: Skin is warm and dry.     Capillary Refill: Capillary refill takes less than 2 seconds.  Neurological:     Mental Status: She is alert and oriented to person, place, and time.     ED Results / Procedures / Treatments   Labs (all labs ordered are listed, but only abnormal results are displayed) Labs Reviewed  CBC WITH DIFFERENTIAL/PLATELET  COMPREHENSIVE METABOLIC PANEL  LIPASE, BLOOD    EKG None  Radiology US Abdomen Limited RUQ  Result Date: 04/04/2020 CLINICAL DATA:  RIGHT-sided back pain for 1 week worse since last night EXAM: ULTRASOUND ABDOMEN LIMITED RIGHT UPPER QUADRANT COMPARISON:  None FINDINGS: Gallbladder:  Multiple small dependent gallstones in gallbladder up to 10 mm diameter. No gallbladder wall thickening, pericholecystic fluid or sonographic Murphy sign. Common bile duct: Diameter: 7 mm, distally; not well visualized at porta hepatis. Liver: Normal echogenicity without mass or nodularity. No intrahepatic biliary dilatation. Portal vein is patent on color Doppler imaging with normal direction of blood flow towards the liver. Other: No RIGHT upper quadrant free fluid. IMPRESSION: Cholelithiasis without evidence acute cholecystitis. Borderline dilated CBD 7 mm diameter distally, not adequately demonstrated proximally; recommend correlation with LFTs. Electronically Signed   By: Lavonia Dana M.D.   On: 04/04/2020 19:16    Procedures Procedures (including critical care time)  Medications Ordered in ED Medications  methocarbamol (ROBAXIN) tablet 500 mg (has no administration in time range)  fentaNYL (SUBLIMAZE) injection 50 mcg (50 mcg Intravenous Given 04/04/20 2005)    ED Course  I have reviewed the triage vital signs and the nursing notes.  Pertinent labs & imaging results that were available during my care of the patient were reviewed by me and considered in my medical decision making (see chart for details).    MDM Rules/Calculators/A&P                          Patient presenting for evaluation of severe right-sided back pain.  She states the pain is right under her shoulder blade.  Pain has been gradually worsening.  It is worse when she lays flat, she did have some mild intermittent nausea.  She had a negative work-up for PE 2 to 3 days ago, is on Eliquis and had a negative CTA.  As such, my suspicion for PE is extremely low.  I do not believe she needs repeat imaging or work-up for this.  Consider MSK cause, as pain is reproducible.  Consider biliary with pain under the right shoulder blade. Will tx for msk pain with robaxin and obtain RUQ [place].   US shows gallstones with borderline biliary  duct.  Labs pending to assess for LFTs and lipase.  Labs reassuring.  No leukocytosis.  LFTs are normal.  Bili normal.  As such, low suspicion for stone in the gallbladder duct.  Consider accommodation of MSK pain  and biliary pain.  Discussed findings with patient.  Discussed symptomatic treatment for both, follow-up with general surgery as needed if pain continues. PMP checked, pt without concerning controlled substance use. At this time, patient appears safe for discharge.  Return precautions given.  Patient states she understands and agrees to plan.  Final Clinical Impression(s) / ED Diagnoses Final diagnoses:  Right-sided back pain  Gallstones  Upper back pain on right side    Rx / DC Orders ED Discharge Orders         Ordered    methocarbamol (ROBAXIN) 500 MG tablet  2 times daily PRN,   Status:  Discontinued     Reprint     04/04/20 2038    HYDROcodone-acetaminophen (NORCO/VICODIN) 5-325 MG tablet  Every 6 hours PRN     Discontinue  Reprint     04/04/20 2038    methocarbamol (ROBAXIN) 500 MG tablet  2 times daily PRN     Discontinue  Reprint     04/04/20 2040           Deklin Bieler, PA-C 04/04/20 2045    Deno Etienne, DO 04/04/20 2054

## 2020-04-06 ENCOUNTER — Other Ambulatory Visit (INDEPENDENT_AMBULATORY_CARE_PROVIDER_SITE_OTHER): Payer: Self-pay | Admitting: Internal Medicine

## 2020-04-06 DIAGNOSIS — R1011 Right upper quadrant pain: Secondary | ICD-10-CM

## 2020-04-06 MED ORDER — HYDROCODONE-ACETAMINOPHEN 5-325 MG PO TABS: 1.0000 | ORAL_TABLET | Freq: Four times a day (QID) | ORAL | 0 refills | Status: DC | PRN

## 2020-04-06 MED FILL — HYDROCODON-APAP 5-325: 5-325 | 8 days supply | Qty: 30 | Fill #0

## 2020-04-06 NOTE — Telephone Encounter (Signed)
I have looked at her visit to the emergency room including blood work, ultrasound.  She does have gallstones but the liver enzymes were normal at the time of the pain.  If she is continue to have pain, she needs to go back to the emergency room.  Prescribing strong pain medicines is not the solution without further evaluation.

## 2020-04-06 NOTE — Telephone Encounter (Signed)
[name] is calling asking for a refill on the pain medication that was prescribed in the ER for the gallstones, please advise?

## 2020-04-06 NOTE — Telephone Encounter (Signed)
Brittiney is aware of the recommendation

## 2020-04-08 ENCOUNTER — Ambulatory Visit: Payer: 59 | Admitting: Physician Assistant

## 2020-04-09 ENCOUNTER — Encounter: Payer: Self-pay | Admitting: Physician Assistant

## 2020-04-09 ENCOUNTER — Ambulatory Visit (INDEPENDENT_AMBULATORY_CARE_PROVIDER_SITE_OTHER): Payer: 59 | Admitting: Physician Assistant

## 2020-04-09 VITALS — BP 130/80 | HR 96 | Ht 65.0 in | Wt 203.4 lb

## 2020-04-09 DIAGNOSIS — M549 Dorsalgia, unspecified: Secondary | ICD-10-CM | POA: Diagnosis not present

## 2020-04-09 DIAGNOSIS — R200 Anesthesia of skin: Secondary | ICD-10-CM | POA: Diagnosis not present

## 2020-04-09 NOTE — Patient Instructions (Addendum)
If you are age 59 or older, your body mass index should be between 23-30. Your Body mass index is 33.84 kg/m. If this is out of the aforementioned range listed, please consider follow up with your Primary Care Provider.  If you are age 35 or younger, your body mass index should be between 19-25. Your Body mass index is 33.84 kg/m. If this is out of the aformentioned range listed, please consider follow up with your Primary Care Provider.   Referral placed to Dr. Charlann Boxer, DO for back pain.   Follow up as needed.

## 2020-04-10 ENCOUNTER — Encounter: Payer: Self-pay | Admitting: Physician Assistant

## 2020-04-10 NOTE — Progress Notes (Signed)
Chief Complaint: Epigastric pain  HPI:    [name] is a 59 year old African-American female, known to Dr. Carlean Purl, with a past medical history as listed below, who was referred to me by Doree Albee, MD for a complaint of epigastric pain.      07/18/2014 colonoscopy was normal.  Repeat recommended in 10 years.    04/04/2020 patient seen in the ED for right sided back pain.  At that time described it for the past 5 days she had severe right-sided back pain which was worsening with movement of her arm and when she laid flat.  She had been in the ER 3 days ago for the same and had a negative CTA.  She had a history of PEs and is on Eliquis.  Also describes some intermittent nausea and mild epigastric/substernal discomfort.  This had resolved.  CBC, CMP and lipase normal.  Right upper quadrant ultrasound with cholelithiasis without evidence of acute cholecystitis, borderline dilated CBD 7 mm diameter distally, not adequately demonstrated proximally.  Patient was given Robaxin and fentanyl.  Sent home with hydrocodone.    Today, the patient presents to clinic and tells me "I am not having any abdominal pain I have no GI symptoms at all it is just back pain".  Tells me that no one has been listening to her.  Describes that about 2 weeks ago she felt a pinch right between her shoulder blades and ever since then has had some back pain which feels like it is just underneath her scapula.  Explains that it has been worsening over the past 2 weeks and in fact in the past week she has felt some arm number numbness as well as tingling in her fingers and when she uses her hand this pain is worse.  Tells me she tried using heating and cold pads but this seemed to make it worse.  Nothing has been helping this pain and the patient feels that no one is listening to her.    Patient works for [employer].    Denies fever, chills, change in bowel habits, heartburn, reflux, nausea, vomiting, abdominal pain, change in bowel  habits or symptoms that awaken her from sleep.  Past Medical History:  Diagnosis Date  . Anxiety state, unspecified   . DEPRESSION   . Hypertension   . Obesity (BMI 35.0-39.9 without comorbidity) 05/16/2019  . PCOS (polycystic ovarian syndrome) 05/16/2019  . Sinus tachycardia 09/10/2015  . Tear of MCL (medial collateral ligament) of knee 11/2012   left knee  . Vitamin D deficiency disease 05/16/2019    Past Surgical History:  Procedure Laterality Date  . COLONOSCOPY  2015  . DILATATION & CURRETTAGE/HYSTEROSCOPY WITH RESECTOCOPE  08-11-2004   removal polyps  . HAMMER TOE SURGERY Bilateral 03-22-2006   fifth toe  . KNEE ARTHROSCOPY WITH MEDIAL COLLATERAL LIGAMENT RECONSTRUCTION Left 12/18/2012   Procedure: KNEE ARTHROSCOPY WITH OPEN MEDIAL COLLATERAL LIGAMENT RECONSTRUCTION WITH ALLOGRAFT,ANTERIOR CRUCIATE LIGAMENT RESCONSTRUCTION WITH ALLOGRAFT, PARTIAL MEDIALMENISECTOMY/DEBRIDEMENT CHONDROPLASTY;  Surgeon: Sydnee Cabal, MD;  Location: [facility];  Service: Orthopedics;  Laterality: Left;    Current Outpatient Medications  Medication Sig Dispense Refill  . ELIQUIS 5 MG TABS tablet TAKE 1 TABLET BY MOUTH TWO TIMES DAILY (Patient taking differently: Take 5 mg by mouth 2 (two) times daily. ) 60 tablet 0  . estradiol (ESTRACE) 1 MG tablet TAKE 1 TABLET DAILY BY MOUTH (Patient taking differently: Take 1 mg by mouth daily. ) 30 tablet 3  . HYDROcodone-acetaminophen (NORCO/VICODIN)  5-325 MG tablet Take 1 tablet by mouth every 6 (six) hours as needed for severe pain. 30 tablet 0  . magnesium oxide (MAG-OX) 400 MG tablet Take 800 mg by mouth daily.    . methocarbamol (ROBAXIN) 500 MG tablet Take 1 tablet (500 mg total) by mouth 2 (two) times daily as needed for muscle spasms. 10 tablet 0  . NP THYROID 120 MG tablet TAKE 1 TABLET BY MOUTH ONCE DAILY (Patient taking differently: Take 120 mg by mouth daily before breakfast. ) 30 tablet 3  . olmesartan-hydrochlorothiazide (BENICAR HCT)  40-12.5 MG tablet TAKE 1/2 TABLET BY MOUTH DAILY 15 tablet 0  . progesterone (PROMETRIUM) 200 MG capsule Take 400 mg by mouth every evening.     Marland Kitchen ALPRAZolam (XANAX) 0.25 MG tablet TAKE 1 TABLET BY MOUTH ONCE DAILY AT BEDTIME AS NEEDED FOR ANXIETY (Patient not taking: Reported on 04/09/2020) 30 tablet 0   No current facility-administered medications for this visit.    Allergies as of 04/09/2020 - Review Complete 04/09/2020  Allergen Reaction Noted  . Oxycodone-acetaminophen Itching     Family History  Problem Relation Age of Onset  . Hypertension Mother   . Kidney cancer Mother   . Hypertension Father   . Diabetes Maternal Grandmother        amputation  . Colon cancer Neg Hx   . Rectal cancer Neg Hx   . Stomach cancer Neg Hx     Social History   Socioeconomic History  . Marital status: Married    Spouse name: Not on file  . Number of children: Not on file  . Years of education: Not on file  . Highest education level: Not on file  Occupational History  . Not on file  Tobacco Use  . Smoking status: Former Smoker    Packs/day: 0.25    Years: 6.00    Pack years: 1.50    Types: Cigarettes    Quit date: 09/13/1995    Years since quitting: 24.5  . Smokeless tobacco: Never Used  . Tobacco comment: smoked socially on the weekends  Vaping Use  . Vaping Use: Never used  Substance and Sexual Activity  . Alcohol use: Yes    Comment: White wine   . Drug use: No  . Sexual activity: Not on file  Other Topics Concern  . Not on file  Social History Narrative   Married for 52 years,second.Lives with husband.Works at [employer].   Social Determinants of Health   Financial Resource Strain:   . Difficulty of Paying Living Expenses:   Food Insecurity:   . Worried About Charity fundraiser in the Last Year:   . Arboriculturist in the Last Year:   Transportation Needs:   . Film/video editor (Medical):   Marland Kitchen Lack of Transportation (Non-Medical):   Physical  Activity:   . Days of Exercise per Week:   . Minutes of Exercise per Session:   Stress:   . Feeling of Stress :   Social Connections:   . Frequency of Communication with Friends and Family:   . Frequency of Social Gatherings with Friends and Family:   . Attends Religious Services:   . Active Member of Clubs or Organizations:   . Attends Archivist Meetings:   Marland Kitchen Marital Status:   Intimate Partner Violence:   . Fear of Current or Ex-Partner:   . Emotionally Abused:   Marland Kitchen Physically Abused:   . Sexually Abused:  Review of Systems:    Constitutional: No weight loss, fever or chills Skin: No rash  Cardiovascular: No chest pain Respiratory: No SOB  Gastrointestinal: See HPI and otherwise negative Genitourinary: No dysuria  Neurological: No headache, dizziness or syncope Musculoskeletal: No new muscle or joint pain Hematologic: No bleeding or bruising Psychiatric: No history of depression or anxiety   Physical Exam:  Vital signs: BP (!) 130/80 (BP Location: Left Arm, Patient Position: Sitting, Cuff Size: Normal)   Pulse 96   Ht 5\' 5"  (1.651 m) Comment: height measured without shoes  Wt (!) 203 lb 6 oz (92.3 kg)   LMP 06/18/2015   BMI 33.84 kg/m   Constitutional:   Pleasant AA female appears to be in NAD, Well developed, Well nourished, alert and cooperative Head:  Normocephalic and atraumatic. Eyes:   PEERL, EOMI. No icterus. Conjunctiva pink. Ears:  Normal auditory acuity. Neck:  Supple Throat: Oral cavity and pharynx without inflammation, swelling or lesion.  Respiratory: Respirations even and unlabored. Lungs clear to auscultation bilaterally.   No wheezes, crackles, or rhonchi.  Cardiovascular: Normal S1, S2. No MRG. Regular rate and rhythm. No peripheral edema, cyanosis or pallor.  Gastrointestinal:  Soft, nondistended, nontender. No rebound or guarding. Normal bowel sounds. No appreciable masses or hepatomegaly. Rectal:  Not performed.  Msk:  Symmetrical  without gross deformities. Without edema, no deformity or joint abnormality.  Neurologic:  Alert and  oriented x4;  grossly normal neurologically.  Skin:   Dry and intact without significant lesions or rashes. Psychiatric:  Demonstrates good judgement and reason without abnormal affect or behaviors.  RELEVANT LABS AND IMAGING: CBC    Component Value Date/Time   WBC 6.6 04/04/2020 1909   RBC 4.57 04/04/2020 1909   HGB 13.1 04/04/2020 1909   HCT 41.4 04/04/2020 1909   PLT 274 04/04/2020 1909   MCV 90.6 04/04/2020 1909   MCH 28.7 04/04/2020 1909   MCHC 31.6 04/04/2020 1909   RDW 13.6 04/04/2020 1909   LYMPHSABS 2.4 04/04/2020 1909   MONOABS 0.5 04/04/2020 1909   EOSABS 0.0 04/04/2020 1909   BASOSABS 0.0 04/04/2020 1909    CMP     Component Value Date/Time   NA 141 04/04/2020 1909   K 3.7 04/04/2020 1909   CL 108 04/04/2020 1909   CO2 22 04/04/2020 1909   GLUCOSE 94 04/04/2020 1909   BUN 13 04/04/2020 1909   CREATININE 0.84 04/04/2020 1909   CREATININE 0.85 12/04/2019 1209   CALCIUM 9.4 04/04/2020 1909   PROT 8.1 04/04/2020 1909   ALBUMIN 4.4 04/04/2020 1909   AST 15 04/04/2020 1909   ALT 15 04/04/2020 1909   ALKPHOS 56 04/04/2020 1909   BILITOT 0.6 04/04/2020 1909   GFRNONAA >60 04/04/2020 1909   GFRNONAA 76 12/04/2019 1209   GFRAA >60 04/04/2020 1909   GFRAA 88 12/04/2019 1209    Assessment: 1.  Back pain: In further discussion patient has no GI symptoms at all, this is more of a scapular pain/nerve pain which radiates down into her right arm and makes her fingers tingle/numb, she does lift weights while riding her bike typically and this probably irritated something  Plan: 1.  Referred patient to Charlann Boxer, DO with sports medicine for further evaluation.  She was very thankful that someone finally listen to her. 2.  Patient follow in clinic with [place] as needed in the future.  Ellouise Newer, PA-C [facility] 04/10/2020, 9:05 AM  Cc: Doree Albee, MD

## 2020-04-13 ENCOUNTER — Telehealth: Payer: Self-pay

## 2020-04-13 NOTE — Telephone Encounter (Signed)
FMLA papers were received in the office for [facility]. The patient has had 1 office visit that has resulted in a referral. Waiting on a call back from the patient after leaving voicemail about discussing this further.

## 2020-04-14 ENCOUNTER — Other Ambulatory Visit: Payer: Self-pay | Admitting: Internal Medicine

## 2020-04-14 ENCOUNTER — Ambulatory Visit (INDEPENDENT_AMBULATORY_CARE_PROVIDER_SITE_OTHER): Payer: 59 | Admitting: Family Medicine

## 2020-04-14 ENCOUNTER — Other Ambulatory Visit: Payer: Self-pay

## 2020-04-14 ENCOUNTER — Encounter: Payer: Self-pay | Admitting: Family Medicine

## 2020-04-14 ENCOUNTER — Ambulatory Visit (INDEPENDENT_AMBULATORY_CARE_PROVIDER_SITE_OTHER): Payer: 59

## 2020-04-14 VITALS — BP 132/90 | HR 89 | Ht 65.0 in | Wt 204.0 lb

## 2020-04-14 DIAGNOSIS — M542 Cervicalgia: Secondary | ICD-10-CM | POA: Diagnosis not present

## 2020-04-14 DIAGNOSIS — M5010 Cervical disc disorder with radiculopathy, unspecified cervical region: Secondary | ICD-10-CM | POA: Insufficient documentation

## 2020-04-14 DIAGNOSIS — G8929 Other chronic pain: Secondary | ICD-10-CM

## 2020-04-14 DIAGNOSIS — M545 Low back pain, unspecified: Secondary | ICD-10-CM

## 2020-04-14 MED ORDER — GABAPENTIN 100 MG PO CAPS
200.0000 mg | ORAL_CAPSULE | Freq: Every day | ORAL | 3 refills | Status: DC
Start: 2020-04-14 — End: 2020-05-12

## 2020-04-14 MED ORDER — TIZANIDINE HCL 4 MG PO TABS
4.0000 mg | ORAL_TABLET | Freq: Every day | ORAL | 1 refills | Status: DC
Start: 2020-04-14 — End: 2020-08-04

## 2020-04-14 MED ORDER — PREDNISONE 50 MG PO TABS
ORAL_TABLET | ORAL | 0 refills | Status: DC
Start: 2020-04-14 — End: 2020-08-04

## 2020-04-14 MED FILL — GABAPENTIN 100 MG CAP: 100 | 90 days supply | Qty: 180 | Fill #0

## 2020-04-14 MED FILL — ESTRADIOL 1 MG TABS: 1 | 30 days supply | Qty: 30 | Fill #2

## 2020-04-14 MED FILL — ELIQUIS 5 MG TABLET: 5 | 30 days supply | Qty: 60 | Fill #0

## 2020-04-14 MED FILL — tiZANidine HCL 4 MG TABS: 4 | 30 days supply | Qty: 30 | Fill #0

## 2020-04-14 MED FILL — predniSONE 50 MG TABS: 50 | 5 days supply | Qty: 5 | Fill #0

## 2020-04-14 NOTE — Patient Instructions (Addendum)
Good to see you xrays today  Gabapentin 200 mg at night Prednisone 50 mg Zanaflex up to 2 times a day Exercises for the neck See me again in 2-3 weeks

## 2020-04-14 NOTE — Assessment & Plan Note (Signed)
Cervical radiculopathy. Discussed gabapentin, he given Zanaflex instead of Robaxin, avoid anti-inflammatories secondary to patient being on chronic blood thinners. X-rays pending. Patient will increase activity slowly and given home exercises. Follow-up again in 2 to 3 weeks.

## 2020-04-14 NOTE — Progress Notes (Signed)
[facility] [address] Alma Bradley Phone: [phone] Subjective:   I [name] am serving as a Education administrator for Dr. Hulan Saas.  This visit occurred during the SARS-CoV-2 public health emergency.  Safety protocols were in place, including screening questions prior to the visit, additional usage of staff PPE, and extensive cleaning of exam room while observing appropriate contact time as indicated for disinfecting solutions.   I'm seeing this patient by the request  of:  Regina Albee, MD  CC: Neck, arm, back pain  [id number]  [name] is a 59 y.o. female coming in with complaint of back pain. Pain is also in her wrist. Pain radiates down the arm with numbness in the hand. Started feeling like a pinch and got worse. Patient as some neck discomfort. 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 Weakness in the arm. Patient states she can't open a bottle of water.  Onset- Acute  Location - right shoulder and wrist pain  Duration-  Character- sharp shoulder blade pain, elbow down- feels like "pins sticking" Aggravating factors- writing, typing  Reliving factors-  Therapies tried- heat and ice made it worse  Severity- 10/10 patient has been seen by ED     Past Medical History:  Diagnosis Date  . Anxiety state, unspecified   . DEPRESSION   . Hypertension   . Obesity (BMI 35.0-39.9 without comorbidity) 05/16/2019  . PCOS (polycystic ovarian syndrome) 05/16/2019  . Sinus tachycardia 09/10/2015  . Tear of MCL (medial collateral ligament) of knee 11/2012   left knee  . Vitamin D deficiency disease 05/16/2019   Past Surgical History:  Procedure Laterality Date  . COLONOSCOPY  2015  . DILATATION & CURRETTAGE/HYSTEROSCOPY WITH RESECTOCOPE  08-11-2004   removal polyps  . HAMMER TOE SURGERY Bilateral 03-22-2006   fifth toe  . KNEE ARTHROSCOPY WITH MEDIAL COLLATERAL LIGAMENT RECONSTRUCTION Left  12/18/2012   Procedure: KNEE ARTHROSCOPY WITH OPEN MEDIAL COLLATERAL LIGAMENT RECONSTRUCTION WITH ALLOGRAFT,ANTERIOR CRUCIATE LIGAMENT RESCONSTRUCTION WITH ALLOGRAFT, PARTIAL MEDIALMENISECTOMY/DEBRIDEMENT CHONDROPLASTY;  Surgeon: Sydnee Cabal, MD;  Location: [facility];  Service: Orthopedics;  Laterality: Left;   Social History   Socioeconomic History  . Marital status: Married    Spouse name: Not on file  . Number of children: Not on file  . Years of education: Not on file  . Highest education level: Not on file  Occupational History  . Not on file  Tobacco Use  . Smoking status: Former Smoker    Packs/day: 0.25    Years: 6.00    Pack years: 1.50    Types: Cigarettes    Quit date: 09/13/1995    Years since quitting: 24.6  . Smokeless tobacco: Never Used  . Tobacco comment: smoked socially on the weekends  Vaping Use  . Vaping Use: Never used  Substance and Sexual Activity  . Alcohol use: Yes    Comment: White wine   . Drug use: No  . Sexual activity: Not on file  Other Topics Concern  . Not on file  Social History Narrative   Married for 11 years,second.Lives with husband.Works at [employer].   Social Determinants of Health   Financial Resource Strain:   . Difficulty of Paying Living Expenses:   Food Insecurity:   . Worried About Charity fundraiser in the Last Year:   . [place] in the Last Year:  Transportation Needs:   . Film/video editor (Medical):   Marland Kitchen Lack of Transportation (Non-Medical):   Physical Activity:   . Days of Exercise per Week:   . Minutes of Exercise per Session:   Stress:   . Feeling of Stress :   Social Connections:   . Frequency of Communication with Friends and Family:   . Frequency of Social Gatherings with Friends and Family:   . Attends Religious Services:   . Active Member of Clubs or Organizations:   . Attends Archivist Meetings:   Marland Kitchen Marital Status:    Allergies  Allergen  Reactions  . Oxycodone-Acetaminophen Itching    TYLOX ALSO   Family History  Problem Relation Age of Onset  . Hypertension Mother   . Kidney cancer Mother   . Hypertension Father   . Diabetes Maternal Grandmother        amputation  . Colon cancer Neg Hx   . Rectal cancer Neg Hx   . Stomach cancer Neg Hx     Current Outpatient Medications (Endocrine & Metabolic):  .  estradiol (ESTRACE) 1 MG tablet, TAKE 1 TABLET DAILY BY MOUTH (Patient taking differently: Take 1 mg by mouth daily. ) .  NP THYROID 120 MG tablet, TAKE 1 TABLET BY MOUTH ONCE DAILY (Patient taking differently: Take 120 mg by mouth daily before breakfast. ) .  progesterone (PROMETRIUM) 200 MG capsule, Take 400 mg by mouth every evening.  .  predniSONE (DELTASONE) 50 MG tablet, 1 tablet by mouth daily  Current Outpatient Medications (Cardiovascular):  .  olmesartan-hydrochlorothiazide (BENICAR HCT) 40-12.5 MG tablet, TAKE 1/2 TABLET BY MOUTH DAILY   Current Outpatient Medications (Analgesics):  .  HYDROcodone-acetaminophen (NORCO/VICODIN) 5-325 MG tablet, Take 1 tablet by mouth every 6 (six) hours as needed for severe pain.  Current Outpatient Medications (Hematological):  Marland Kitchen  ELIQUIS 5 MG TABS tablet, TAKE 1 TABLET BY MOUTH TWO TIMES DAILY (Patient taking differently: Take 5 mg by mouth 2 (two) times daily. )  Current Outpatient Medications (Other):  Marland Kitchen  ALPRAZolam (XANAX) 0.25 MG tablet, TAKE 1 TABLET BY MOUTH ONCE DAILY AT BEDTIME AS NEEDED FOR ANXIETY .  magnesium oxide (MAG-OX) 400 MG tablet, Take 800 mg by mouth daily. .  methocarbamol (ROBAXIN) 500 MG tablet, Take 1 tablet (500 mg total) by mouth 2 (two) times daily as needed for muscle spasms. Marland Kitchen  gabapentin (NEURONTIN) 100 MG capsule, Take 2 capsules (200 mg total) by mouth at bedtime. Marland Kitchen  tiZANidine (ZANAFLEX) 4 MG tablet, Take 1 tablet (4 mg total) by mouth at bedtime.   Reviewed prior external information including notes and imaging from  primary care  provider As well as notes that were available from care everywhere and other healthcare systems.  Past medical history, social, surgical and family history all reviewed in electronic medical record.  No pertanent information unless stated regarding to the chief complaint.   Review of Systems:  No headache, visual changes, nausea, vomiting, diarrhea, constipation, dizziness, abdominal pain, skin rash, fevers, chills, night sweats, weight loss, swollen lymph nodes, joint swelling, chest pain, shortness of breath, mood changes. POSITIVE muscle aches, body aches  Objective  Blood pressure 132/90, pulse 89, height 5\' 5"  (1.651 m), weight 204 lb (92.5 kg), last menstrual period 06/18/2015, SpO2 98 %.   General: Alert and oriented x3 but patient seems to be significantly uncomfortable HEENT: Pupils equal, extraocular movements intact  Respiratory: Patient's speak in full sentences and does not appear short of breath  Cardiovascular: No lower extremity edema, non tender, no erythema  Neuro: Cranial nerves II through XII are intact, neurovascularly intact in all extremities with 2+ DTRs and 2+ pulses.  Gait normal with good balance and coordination.  MSK: Patient holding arm close to her body. Patient does have some mild weakness with grip strength compared to contralateral side. Patient does seem to have very trace swelling of the knee hand as well. Patient noted severe Spurling's maneuver of the neck with radicular symptoms in the C8-8 T1 distribution. Patient so deep tendon reflex appears to be intact of the tricep at the moment. Tenderness noted more in the parascapular region. No masses appreciated.     [id number]; 15 additional minutes spent for Therapeutic exercises as stated in above notes.  This included exercises focusing on stretching, strengthening, with significant focus on eccentric aspects.   Long term goals include an improvement in range of motion, strength, endurance as well as avoiding  reinjury. Patient's frequency would include in 1-2 times a day, 3-5 times a week for a duration of 6-12 weeks. Exercises that included:  Basic scapular stabilization to include adduction and depression of scapula Scaption, focusing on proper movement and good control Internal and External rotation utilizing a theraband, with elbow tucked at side entire time Rows with theraband   Proper technique shown and discussed handout in great detail with ATC.  All questions were discussed and answered.   Impression and Recommendations:     The above documentation has been reviewed and is accurate and complete Lyndal Pulley, DO       Note: This dictation was prepared with Dragon dictation along with smaller phrase technology. Any transcriptional errors that result from this process are unintentional.

## 2020-04-15 ENCOUNTER — Telehealth: Payer: Self-pay | Admitting: Family Medicine

## 2020-04-15 NOTE — Telephone Encounter (Signed)
Patient said that when she was here yesterday to see Dr Tamala Julian he offered to take her out of work but she thought that she could do it. She said that it took her two hours to get ready this morning and she just doesn't think that she can handle it right now. She asked if we could right her out of work and she will get FMLA papers to [place].

## 2020-04-15 NOTE — Telephone Encounter (Signed)
Work note written for 2 days and faxed to number provided.

## 2020-04-15 NOTE — Telephone Encounter (Signed)
Pt called. Advised that we would not be completing FMLA paperwork ( I shredded the FMLA ppwk we received from [employer] )  Pt requests the work note for 2 days be faxed to her at [phone]

## 2020-04-15 NOTE — Telephone Encounter (Signed)
Sent patient MyChart message.

## 2020-04-17 ENCOUNTER — Other Ambulatory Visit (HOSPITAL_COMMUNITY): Payer: Self-pay | Admitting: Neurosurgery

## 2020-04-17 DIAGNOSIS — M542 Cervicalgia: Secondary | ICD-10-CM | POA: Diagnosis not present

## 2020-04-17 DIAGNOSIS — M5412 Radiculopathy, cervical region: Secondary | ICD-10-CM

## 2020-04-17 MED FILL — traMADol HCL 50 MG TABS: 50 | 15 days supply | Qty: 60 | Fill #0

## 2020-04-20 ENCOUNTER — Ambulatory Visit (HOSPITAL_COMMUNITY)
Admission: RE | Admit: 2020-04-20 | Discharge: 2020-04-20 | Disposition: A | Discharge status: Home / Self Care | Payer: 59 | Source: Ambulatory Visit | Attending: Neurosurgery | Admitting: Neurosurgery

## 2020-04-20 DIAGNOSIS — M4802 Spinal stenosis, cervical region: Secondary | ICD-10-CM | POA: Diagnosis not present

## 2020-04-20 DIAGNOSIS — M47813 Spondylosis without myelopathy or radiculopathy, cervicothoracic region: Secondary | ICD-10-CM | POA: Diagnosis not present

## 2020-04-20 DIAGNOSIS — M47812 Spondylosis without myelopathy or radiculopathy, cervical region: Secondary | ICD-10-CM | POA: Diagnosis not present

## 2020-04-20 DIAGNOSIS — M5412 Radiculopathy, cervical region: Secondary | ICD-10-CM | POA: Insufficient documentation

## 2020-04-20 DIAGNOSIS — M50223 Other cervical disc displacement at C6-C7 level: Secondary | ICD-10-CM | POA: Diagnosis not present

## 2020-04-21 DIAGNOSIS — M4722 Other spondylosis with radiculopathy, cervical region: Secondary | ICD-10-CM | POA: Diagnosis not present

## 2020-04-21 DIAGNOSIS — M47812 Spondylosis without myelopathy or radiculopathy, cervical region: Secondary | ICD-10-CM | POA: Diagnosis not present

## 2020-04-21 DIAGNOSIS — M542 Cervicalgia: Secondary | ICD-10-CM | POA: Diagnosis not present

## 2020-04-21 DIAGNOSIS — M5412 Radiculopathy, cervical region: Secondary | ICD-10-CM | POA: Diagnosis not present

## 2020-04-21 DIAGNOSIS — M5030 Other cervical disc degeneration, unspecified cervical region: Secondary | ICD-10-CM | POA: Diagnosis not present

## 2020-04-22 ENCOUNTER — Other Ambulatory Visit (INDEPENDENT_AMBULATORY_CARE_PROVIDER_SITE_OTHER): Payer: Self-pay

## 2020-04-22 ENCOUNTER — Other Ambulatory Visit (INDEPENDENT_AMBULATORY_CARE_PROVIDER_SITE_OTHER): Payer: Self-pay | Admitting: Internal Medicine

## 2020-04-22 MED ORDER — OLMESARTAN MEDOXOMIL-HCTZ 40-12.5 MG PO TABS: 0.5000 | ORAL_TABLET | Freq: Every day | ORAL | 0 refills | Status: DC

## 2020-04-22 MED FILL — OLMESARTAN-HCTZ 40-12.5 MG: 40-12.5 | 16 days supply | Qty: 8 | Fill #0

## 2020-04-22 MED FILL — ARMOUR THYROID 120 MG TAB: 120 | 30 days supply | Qty: 30 | Fill #1

## 2020-05-05 ENCOUNTER — Ambulatory Visit: Payer: 59 | Admitting: Family Medicine

## 2020-05-12 ENCOUNTER — Other Ambulatory Visit: Payer: Self-pay

## 2020-05-12 ENCOUNTER — Ambulatory Visit (INDEPENDENT_AMBULATORY_CARE_PROVIDER_SITE_OTHER): Payer: 59 | Admitting: Internal Medicine

## 2020-05-12 ENCOUNTER — Encounter (INDEPENDENT_AMBULATORY_CARE_PROVIDER_SITE_OTHER): Payer: Self-pay | Admitting: Internal Medicine

## 2020-05-12 ENCOUNTER — Other Ambulatory Visit (INDEPENDENT_AMBULATORY_CARE_PROVIDER_SITE_OTHER): Payer: Self-pay | Admitting: Internal Medicine

## 2020-05-12 VITALS — BP 125/80 | HR 97 | Temp 97.7°F | Ht 65.0 in | Wt 201.4 lb

## 2020-05-12 DIAGNOSIS — E669 Obesity, unspecified: Secondary | ICD-10-CM | POA: Diagnosis not present

## 2020-05-12 DIAGNOSIS — E282 Polycystic ovarian syndrome: Secondary | ICD-10-CM

## 2020-05-12 DIAGNOSIS — I10 Essential (primary) hypertension: Secondary | ICD-10-CM | POA: Diagnosis not present

## 2020-05-12 DIAGNOSIS — N951 Menopausal and female climacteric states: Secondary | ICD-10-CM

## 2020-05-12 DIAGNOSIS — E559 Vitamin D deficiency, unspecified: Secondary | ICD-10-CM

## 2020-05-12 MED ORDER — GABAPENTIN 100 MG PO CAPS: 200.0000 mg | ORAL_CAPSULE | Freq: Every day | ORAL | 1 refills | Status: DC

## 2020-05-12 MED ORDER — PROGESTERONE 200 MG PO CAPS: 400.0000 mg | ORAL_CAPSULE | Freq: Every evening | ORAL | 1 refills | Status: DC

## 2020-05-12 MED ORDER — ESTRADIOL 1 MG PO TABS: 1.0000 mg | ORAL_TABLET | Freq: Every day | ORAL | 1 refills | Status: DC

## 2020-05-12 MED ORDER — NONFORMULARY OR COMPOUNDED ITEM: 5.0000 mg | 3 refills | Status: DC

## 2020-05-12 MED ORDER — OLMESARTAN MEDOXOMIL-HCTZ 40-12.5 MG PO TABS: 0.5000 | ORAL_TABLET | Freq: Every day | ORAL | 1 refills | Status: DC

## 2020-05-12 MED ORDER — THYROID 120 MG PO TABS
120.0000 mg | ORAL_TABLET | Freq: Every day | ORAL | 1 refills | Status: DC
Start: 2020-05-12 — End: 2020-07-27

## 2020-05-12 MED FILL — PROGESTERONE 200 MG CAPS: 200 | 90 days supply | Qty: 180 | Fill #0

## 2020-05-12 MED FILL — ESTRADIOL 1 MG TABS: 1 | 90 days supply | Qty: 90 | Fill #0

## 2020-05-12 MED FILL — OLMESARTAN-HCTZ 40-12.5 MG: 40-12.5 | 90 days supply | Qty: 45 | Fill #0

## 2020-05-12 NOTE — Progress Notes (Signed)
Metrics: Intervention Frequency ACO  Documented Smoking Status Yearly  Screened one or more times in 24 months  Cessation Counseling or  Active cessation medication Past 24 months  Past 24 months   Guideline developer: UpToDate (See UpToDate for funding source) Date Released: 2014       Wellness Office Visit  Subjective:  Patient ID: [name], female    DOB: September 06, 1961  Age: 59 y.o. MRN: [id number]  CC: This lady comes in for follow-up of menopausal symptoms, PCOS, obesity, vitamin D deficiency and symptoms of thyroid deficiency.  As well as hypertension. HPI  She had taken COVID-19 vaccination in January as a first dose and then subsequently had pulmonary emboli.  She is now on anticoagulation therapy and thankfully had her second dose without any complications 5 days ago. She continues on antihypertensive therapy for hypertension. She continues on estradiol and progesterone for her hot flashes and her symptoms of mostly resolved. She wishes to go back on testosterone therapy now. Her weight is unchanged and she has had challenges keeping to a nutritional plan with everything that she went through with pulmonary emboli. Past Medical History:  Diagnosis Date  . Anxiety state, unspecified   . DEPRESSION   . Hypertension   . Obesity (BMI 35.0-39.9 without comorbidity) 05/16/2019  . PCOS (polycystic ovarian syndrome) 05/16/2019  . Sinus tachycardia 09/10/2015  . Tear of MCL (medial collateral ligament) of knee 11/2012   left knee  . Vitamin D deficiency disease 05/16/2019   Past Surgical History:  Procedure Laterality Date  . COLONOSCOPY  2015  . DILATATION & CURRETTAGE/HYSTEROSCOPY WITH RESECTOCOPE  08-11-2004   removal polyps  . HAMMER TOE SURGERY Bilateral 03-22-2006   fifth toe  . KNEE ARTHROSCOPY WITH MEDIAL COLLATERAL LIGAMENT RECONSTRUCTION Left 12/18/2012   Procedure: KNEE ARTHROSCOPY WITH OPEN MEDIAL COLLATERAL LIGAMENT RECONSTRUCTION WITH ALLOGRAFT,ANTERIOR CRUCIATE  LIGAMENT RESCONSTRUCTION WITH ALLOGRAFT, PARTIAL MEDIALMENISECTOMY/DEBRIDEMENT CHONDROPLASTY;  Surgeon: Sydnee Cabal, MD;  Location: [facility];  Service: Orthopedics;  Laterality: Left;     Family History  Problem Relation Age of Onset  . Hypertension Mother   . Kidney cancer Mother   . Hypertension Father   . Diabetes Maternal Grandmother        amputation  . Colon cancer Neg Hx   . Rectal cancer Neg Hx   . Stomach cancer Neg Hx     Social History   Social History Narrative   Married for 87 years,second.Lives with husband.Works at [employer].   Social History   Tobacco Use  . Smoking status: Former Smoker    Packs/day: 0.25    Years: 6.00    Pack years: 1.50    Types: Cigarettes    Quit date: 09/13/1995    Years since quitting: 24.6  . Smokeless tobacco: Never Used  . Tobacco comment: smoked socially on the weekends  Substance Use Topics  . Alcohol use: Yes    Comment: White wine     Current Meds  Medication Sig  . ALPRAZolam (XANAX) 0.25 MG tablet TAKE 1 TABLET BY MOUTH ONCE DAILY AT BEDTIME AS NEEDED FOR ANXIETY  . ELIQUIS 5 MG TABS tablet TAKE 1 TABLET BY MOUTH TWO TIMES DAILY (NEEDS OFFICE VISIT)  . estradiol (ESTRACE) 1 MG tablet Take 1 tablet (1 mg total) by mouth daily.  Marland Kitchen gabapentin (NEURONTIN) 100 MG capsule Take 2 capsules (200 mg total) by mouth at bedtime.  Marland Kitchen HYDROcodone-acetaminophen (NORCO/VICODIN) 5-325 MG tablet Take 1 tablet by mouth every 6 (  six) hours as needed for severe pain.  . methocarbamol (ROBAXIN) 500 MG tablet Take 1 tablet (500 mg total) by mouth 2 (two) times daily as needed for muscle spasms.  Marland Kitchen olmesartan-hydrochlorothiazide (BENICAR HCT) 40-12.5 MG tablet Take 0.5 tablets by mouth daily.  . predniSONE (DELTASONE) 50 MG tablet 1 tablet by mouth daily  . progesterone (PROMETRIUM) 200 MG capsule Take 2 capsules (400 mg total) by mouth every evening.  . thyroid (ARMOUR) 120 MG tablet Take 1 tablet (120 mg  total) by mouth daily before breakfast.  . tiZANidine (ZANAFLEX) 4 MG tablet Take 1 tablet (4 mg total) by mouth at bedtime.  . traMADol (ULTRAM) 50 MG tablet Take 50 mg by mouth every 6 (six) hours as needed.  . [DISCONTINUED] estradiol (ESTRACE) 1 MG tablet TAKE 1 TABLET DAILY BY MOUTH (Patient taking differently: Take 1 mg by mouth daily. )  . [DISCONTINUED] gabapentin (NEURONTIN) 100 MG capsule Take 2 capsules (200 mg total) by mouth at bedtime.  . [DISCONTINUED] olmesartan-hydrochlorothiazide (BENICAR HCT) 40-12.5 MG tablet Take 0.5 tablets by mouth daily.  . [DISCONTINUED] progesterone (PROMETRIUM) 200 MG capsule Take 400 mg by mouth every evening.   . [DISCONTINUED] thyroid (ARMOUR) 120 MG tablet Take 120 mg by mouth daily before breakfast.      Depression screen [facility] 2/9 12/04/2019  Decreased Interest 0  Down, Depressed, Hopeless 0  PHQ - 2 Score 0     Objective:   Today's Vitals: BP 125/80 (BP Location: Right Arm, Patient Position: Sitting, Cuff Size: Normal)   Pulse 97   Temp 97.7 F (36.5 C) (Temporal)   Ht 5\' 5"  (1.651 m)   Wt 201 lb 6.4 oz (91.4 kg)   LMP 06/18/2015   SpO2 97%   BMI 33.51 kg/m  Vitals with BMI 05/12/2020 04/14/2020 04/09/2020  Height 5\' 5"  5\' 5"  5\' 5"   Weight 201 lbs 6 oz 204 lbs 203 lbs 6 oz  BMI 33.51 67.89 38.10  Systolic 175 102 585  Diastolic 80 90 80  Pulse 97 89 96     Physical Exam  She looks systemically well.  She remains obese.  Her weight is unchanged from the last time I saw her in March.     Assessment   1. PCOS (polycystic ovarian syndrome)   2. Essential hypertension   3. Vitamin D deficiency disease   4. Obesity (BMI 30-39.9)   5. Hot flashes due to menopause       Tests ordered No orders of the defined types were placed in this encounter.    Plan: 1. I have refilled most of her medications today. 2. She will continue with Benicar HCT for hypertension which is keeping it under good control. 3. She will continue  with bioidentical hormone therapy with estradiol and progesterone for her symptoms of menopause. 4. [facility] 5 mg intramuscularly once a week. 5. Today we discussed nutrition and the importance of a plant-based diet and we discussed the blue zones focusing on nuts and beans every day and reducing the amount of animal protein. 6. Follow-up in 6 weeks to see how she is doing and we will likely check blood work then.   Meds ordered this encounter  Medications  . estradiol (ESTRACE) 1 MG tablet    Sig: Take 1 tablet (1 mg total) by mouth daily.    Dispense:  90 tablet    Refill:  1  . gabapentin (NEURONTIN) 100  MG capsule    Sig: Take 2 capsules (200 mg total) by mouth at bedtime.    Dispense:  180 capsule    Refill:  1  . olmesartan-hydrochlorothiazide (BENICAR HCT) 40-12.5 MG tablet    Sig: Take 0.5 tablets by mouth daily.    Dispense:  45 tablet    Refill:  1    Please let the patient know that she will need to make a follow-up appointment before I will refill any further medications.  Thank you.  . progesterone (PROMETRIUM) 200 MG capsule    Sig: Take 2 capsules (400 mg total) by mouth every evening.    Dispense:  180 capsule    Refill:  1  . thyroid (ARMOUR) 120 MG tablet    Sig: Take 1 tablet (120 mg total) by mouth daily before breakfast.    Dispense:  90 tablet    Refill:  1  . NONFORMULARY OR COMPOUNDED ITEM    Sig: Inject 5 mg into the muscle every 7 (seven) days. Testosterone cypionate in olive  oil (25 mg/mL).  Dispense 5 mL vial.    Dispense:  1 each    Refill:  3    Myrle Dues Luther Parody, MD

## 2020-05-21 ENCOUNTER — Other Ambulatory Visit: Payer: Self-pay | Admitting: Internal Medicine

## 2020-05-21 MED FILL — ELIQUIS 5 MG TABLET: 5 | 30 days supply | Qty: 60 | Fill #0

## 2020-05-25 MED FILL — ARMOUR THYROID 120 MG TAB: 120 | 30 days supply | Qty: 30 | Fill #2

## 2020-05-28 MED FILL — traMADol HCL 50 MG TABS: 50 | 15 days supply | Qty: 60 | Fill #0

## 2020-06-03 MED FILL — PROGESTERONE 200 MG CAPS: 200 | 90 days supply | Qty: 180 | Fill #0

## 2020-06-09 ENCOUNTER — Other Ambulatory Visit (INDEPENDENT_AMBULATORY_CARE_PROVIDER_SITE_OTHER): Payer: Self-pay

## 2020-06-09 MED ORDER — PROGESTERONE 200 MG PO CAPS: 400.0000 mg | ORAL_CAPSULE | Freq: Every evening | ORAL | 0 refills | Status: DC

## 2020-06-15 DIAGNOSIS — M542 Cervicalgia: Secondary | ICD-10-CM | POA: Diagnosis not present

## 2020-06-15 DIAGNOSIS — Z6832 Body mass index (BMI) 32.0-32.9, adult: Secondary | ICD-10-CM | POA: Diagnosis not present

## 2020-06-15 DIAGNOSIS — M4722 Other spondylosis with radiculopathy, cervical region: Secondary | ICD-10-CM | POA: Diagnosis not present

## 2020-06-15 DIAGNOSIS — M5030 Other cervical disc degeneration, unspecified cervical region: Secondary | ICD-10-CM | POA: Diagnosis not present

## 2020-06-15 DIAGNOSIS — M47812 Spondylosis without myelopathy or radiculopathy, cervical region: Secondary | ICD-10-CM | POA: Diagnosis not present

## 2020-06-15 DIAGNOSIS — M5412 Radiculopathy, cervical region: Secondary | ICD-10-CM | POA: Diagnosis not present

## 2020-06-22 ENCOUNTER — Other Ambulatory Visit: Payer: Self-pay | Admitting: Internal Medicine

## 2020-06-22 MED FILL — ARMOUR THYROID 120 MG TAB: 120 | 30 days supply | Qty: 30 | Fill #3

## 2020-06-23 MED FILL — ELIQUIS 5 MG TABLET: 5 | 30 days supply | Qty: 60 | Fill #0

## 2020-07-02 ENCOUNTER — Ambulatory Visit (INDEPENDENT_AMBULATORY_CARE_PROVIDER_SITE_OTHER): Payer: 59 | Admitting: Internal Medicine

## 2020-07-27 ENCOUNTER — Other Ambulatory Visit (INDEPENDENT_AMBULATORY_CARE_PROVIDER_SITE_OTHER): Payer: Self-pay | Admitting: Internal Medicine

## 2020-07-27 ENCOUNTER — Telehealth (INDEPENDENT_AMBULATORY_CARE_PROVIDER_SITE_OTHER): Payer: Self-pay

## 2020-07-27 MED ORDER — THYROID 120 MG PO TABS
120.0000 mg | ORAL_TABLET | Freq: Every day | ORAL | 0 refills | Status: DC
Start: 2020-07-27 — End: 2021-02-23

## 2020-07-27 MED FILL — ARMOUR THYROID 120 MG TAB: 120 | 90 days supply | Qty: 90 | Fill #0

## 2020-07-27 NOTE — Telephone Encounter (Signed)
[name] sent a fax refill for the following medication:  thyroid (ARMOUR) 120 MG tablet  Last filled 05/12/2020, # 90 with 1 refill  Last OV 05/12/2020

## 2020-08-03 MED FILL — OLMESARTAN-HCTZ 40-12.5 MG: 40-12.5 | 90 days supply | Qty: 45 | Fill #1

## 2020-08-04 ENCOUNTER — Telehealth (INDEPENDENT_AMBULATORY_CARE_PROVIDER_SITE_OTHER): Payer: 59 | Admitting: Internal Medicine

## 2020-08-04 ENCOUNTER — Encounter (INDEPENDENT_AMBULATORY_CARE_PROVIDER_SITE_OTHER): Payer: Self-pay | Admitting: Internal Medicine

## 2020-08-04 ENCOUNTER — Other Ambulatory Visit: Payer: Self-pay | Admitting: Internal Medicine

## 2020-08-04 ENCOUNTER — Other Ambulatory Visit (INDEPENDENT_AMBULATORY_CARE_PROVIDER_SITE_OTHER): Payer: Self-pay | Admitting: Internal Medicine

## 2020-08-04 ENCOUNTER — Other Ambulatory Visit: Payer: Self-pay

## 2020-08-04 VITALS — BP 118/82 | Temp 97.5°F | Ht 65.0 in | Wt 198.3 lb

## 2020-08-04 DIAGNOSIS — E669 Obesity, unspecified: Secondary | ICD-10-CM | POA: Diagnosis not present

## 2020-08-04 DIAGNOSIS — N951 Menopausal and female climacteric states: Secondary | ICD-10-CM | POA: Diagnosis not present

## 2020-08-04 DIAGNOSIS — F411 Generalized anxiety disorder: Secondary | ICD-10-CM | POA: Diagnosis not present

## 2020-08-04 DIAGNOSIS — I10 Essential (primary) hypertension: Secondary | ICD-10-CM

## 2020-08-04 DIAGNOSIS — E282 Polycystic ovarian syndrome: Secondary | ICD-10-CM

## 2020-08-04 MED ORDER — ALPRAZOLAM 0.25 MG PO TABS: ORAL_TABLET | ORAL | 1 refills | Status: DC

## 2020-08-04 MED FILL — ALPRAZolam 0.25 MG TABS: 0.25 | 30 days supply | Qty: 30 | Fill #0

## 2020-08-04 NOTE — Progress Notes (Signed)
Metrics: Intervention Frequency ACO  Documented Smoking Status Yearly  Screened one or more times in 24 months  Cessation Counseling or  Active cessation medication Past 24 months  Past 24 months   Guideline developer: UpToDate (See UpToDate for funding source) Date Released: 2014       Wellness Office Visit  Subjective:  Patient ID: [name], female    DOB: 10-May-1961  Age: 59 y.o. MRN: [id number]  CC: This is an audio telemedicine visit with the permission of the patient who is at work and now in my office.  I used 2 identifiers to identify the patient. This visit is to follow-up on bioidentical hormone therapy, PCOS, hypertension and obesity. HPI  She is doing very well.  She is tolerating estradiol, progesterone and testosterone.  She has more energy, focus and concentration and a sense of wellbeing.  Hot flashes have virtually gone now. She is focusing on nutrition and is managing to lose some weight slowly. She has tolerated desiccated thyroid without any problems. She would like to stop taking Eliquis and she will make an appointment to reevaluate this. Past Medical History:  Diagnosis Date  . Anxiety state, unspecified   . DEPRESSION   . Hypertension   . Obesity (BMI 35.0-39.9 without comorbidity) 05/16/2019  . PCOS (polycystic ovarian syndrome) 05/16/2019  . Sinus tachycardia 09/10/2015  . Tear of MCL (medial collateral ligament) of knee 11/2012   left knee  . Vitamin D deficiency disease 05/16/2019   Past Surgical History:  Procedure Laterality Date  . COLONOSCOPY  2015  . DILATATION & CURRETTAGE/HYSTEROSCOPY WITH RESECTOCOPE  08-11-2004   removal polyps  . HAMMER TOE SURGERY Bilateral 03-22-2006   fifth toe  . KNEE ARTHROSCOPY WITH MEDIAL COLLATERAL LIGAMENT RECONSTRUCTION Left 12/18/2012   Procedure: KNEE ARTHROSCOPY WITH OPEN MEDIAL COLLATERAL LIGAMENT RECONSTRUCTION WITH ALLOGRAFT,ANTERIOR CRUCIATE LIGAMENT RESCONSTRUCTION WITH ALLOGRAFT, PARTIAL  MEDIALMENISECTOMY/DEBRIDEMENT CHONDROPLASTY;  Surgeon: Sydnee Cabal, MD;  Location: [facility];  Service: Orthopedics;  Laterality: Left;     Family History  Problem Relation Age of Onset  . Hypertension Mother   . Kidney cancer Mother   . Hypertension Father   . Diabetes Maternal Grandmother        amputation  . Colon cancer Neg Hx   . Rectal cancer Neg Hx   . Stomach cancer Neg Hx     Social History   Social History Narrative   Married for 59 years,second.Lives with husband.Works at [employer].   Social History   Tobacco Use  . Smoking status: Former Smoker    Packs/day: 0.25    Years: 6.00    Pack years: 1.50    Types: Cigarettes    Quit date: 09/13/1995    Years since quitting: 24.9  . Smokeless tobacco: Never Used  . Tobacco comment: smoked socially on the weekends  Substance Use Topics  . Alcohol use: Yes    Comment: White wine     Current Meds  Medication Sig  . ALPRAZolam (XANAX) 0.25 MG tablet TAKE 1 TABLET BY MOUTH ONCE DAILY AT BEDTIME AS NEEDED FOR ANXIETY  . ELIQUIS 5 MG TABS tablet TAKE 1 TABLET BY MOUTH TWO TIMES DAILY (NEEDS OFFICE VISIT)  . estradiol (ESTRACE) 1 MG tablet Take 1 tablet (1 mg total) by mouth daily.  . NONFORMULARY OR COMPOUNDED ITEM Inject 5 mg into the muscle every 7 (seven) days. Testosterone cypionate in olive  oil (25 mg/mL).  Dispense 5 mL vial.  . olmesartan-hydrochlorothiazide (BENICAR  HCT) 40-12.5 MG tablet Take 0.5 tablets by mouth daily.  . progesterone (PROMETRIUM) 200 MG capsule Take 2 capsules (400 mg total) by mouth every evening.  . thyroid (ARMOUR) 120 MG tablet Take 1 tablet (120 mg total) by mouth daily before breakfast.  . [DISCONTINUED] ALPRAZolam (XANAX) 0.25 MG tablet TAKE 1 TABLET BY MOUTH ONCE DAILY AT BEDTIME AS NEEDED FOR ANXIETY  . [DISCONTINUED] gabapentin (NEURONTIN) 100 MG capsule Take 2 capsules (200 mg total) by mouth at bedtime.      Depression screen [facility] 2/9  12/04/2019  Decreased Interest 0  Down, Depressed, Hopeless 0  PHQ - 2 Score 0     Objective:   Today's Vitals: BP 118/82   Temp (!) 97.5 F (36.4 C) (Temporal)   Ht 5\' 5"  (1.651 m)   Wt 198 lb 4.8 oz (89.9 kg)   LMP 06/18/2015   BMI 33.00 kg/m  Vitals with BMI 08/04/2020 05/12/2020 04/14/2020  Height 5\' 5"  5\' 5"  5\' 5"   Weight 198 lbs 5 oz 201 lbs 6 oz 204 lbs  BMI 33 88.41 66.06  Systolic 301 601 093  Diastolic 82 80 90  Pulse - 97 89     Physical Exam  She is now below 200 pounds.  Blood pressure is excellent.  This was taken at her place of work.  She appears to be alert and orientated on the phone.     Assessment   1. Hot flashes due to menopause   2. PCOS (polycystic ovarian syndrome)   3. Essential hypertension   4. Obesity (BMI 35.0-39.9 without comorbidity)   5. Anxiety state       Tests ordered No orders of the defined types were placed in this encounter.    Plan: 1. She will continue with identical hormone therapy. 2. She will continue Benicar/HCTZ. 3. She will continue with desiccated Armour Thyroid. 4. I have refilled her Xanax at a small dose.  She has not had a refill for more than 2 months. 5. I will see her in February for annual physical exam. 6. This phone call lasted 12 minutes.   Meds ordered this encounter  Medications  . ALPRAZolam (XANAX) 0.25 MG tablet    Sig: TAKE 1 TABLET BY MOUTH ONCE DAILY AT BEDTIME AS NEEDED FOR ANXIETY    Dispense:  30 tablet    Refill:  1    Ercia Crisafulli Luther Parody, MD

## 2020-08-05 ENCOUNTER — Other Ambulatory Visit: Payer: Self-pay | Admitting: Internal Medicine

## 2020-08-05 MED FILL — ELIQUIS 5 MG TABLET: 5 | 30 days supply | Qty: 60 | Fill #0

## 2020-08-05 NOTE — Telephone Encounter (Signed)
Dr Melvyn Novas- are you wanting to continue refilling this Eliquis for her? Please advise thanks!

## 2020-08-13 MED FILL — ESTRADIOL 1 MG TABS: 1 | 90 days supply | Qty: 90 | Fill #1

## 2020-08-14 MED FILL — ELIQUIS 5 MG TABLET: 5 | 30 days supply | Qty: 60 | Fill #0

## 2020-10-06 ENCOUNTER — Telehealth: Payer: Self-pay | Admitting: Internal Medicine

## 2020-10-07 ENCOUNTER — Other Ambulatory Visit: Payer: Self-pay | Admitting: Internal Medicine

## 2020-10-07 MED FILL — PROGESTERONE 200 MG CAPS: 200 | 90 days supply | Qty: 180 | Fill #1

## 2020-10-07 MED FILL — ELIQUIS 5 MG TABLET: 5 | 30 days supply | Qty: 60 | Fill #0

## 2020-10-07 MED FILL — ALPRAZolam 0.25 MG TABS: 0.25 | 30 days supply | Qty: 30 | Fill #1

## 2020-10-07 NOTE — Telephone Encounter (Signed)
Dr. Melvyn Novas, please advise if you are okay with [place] refilling med.  Assessment & Plan Note by Tanda Rockers, MD at 12/25/2019 4:28 PM  Author: Tanda Rockers, MD Author Type: Physician Filed: 12/25/2019 4:31 PM  Note Status: Written Cosign: Cosign Not Required Encounter Date: 12/25/2019  Problem: Acute pulmonary embolism ([place])  Editor: Tanda Rockers, MD (Physician)               Pos fm hx Prot S def/ on HRT  - onset covid symptoms 09/20/19 > admit 2/3 rx eliquis   Likely this was a provoked event but strongly suspect she has Prot S def as well therefore rec;  Minimum of 3 months rx then check venous dopplers and Echo and if nl then consider change over to prophylactic dose vs continue max rx and will stop DOAC one week prior to hematology eval to get accurate hypercoagulability profile   Discussed in detail all the  indications, usual  risks and alternatives  relative to the benefits with patient who agrees to proceed with w/u as outlined.             Each maintenance medication was reviewed in detail including emphasizing most importantly the difference between maintenance and prns and under what circumstances the prns are to be triggered using an action plan format where appropriate.  Total time for H and P, chart review, counseling,  and generating customized AVS unique to this office visit / charting = 45 min          Patient Instructions by Tanda Rockers, MD at 12/25/2019 9:15 AM Author: Tanda Rockers, MD Author Type: Physician Filed: 12/25/2019 10:05 AM  Note Status: Addendum Cosign: Cosign Not Required Encounter Date: 12/25/2019  Editor: Tanda Rockers, MD (Physician)      Prior Versions: 1. Tanda Rockers, MD (Physician) at 12/25/2019 10:03 AM - Addendum   2. Tanda Rockers, MD (Physician) at 12/25/2019 9:59 AM - Addendum   3. Tanda Rockers, MD (Physician) at 12/25/2019 9:58 AM - Signed    Gradually increase your exercise = short of breath not out of  breath   We will set you up for Echocardiogram and Venous dopplers same day first week in May 2021  and call you with the results prior to considering stopping Eliquis - do not want you off for more than a week prior to seeing the hematologist.

## 2020-10-07 NOTE — Telephone Encounter (Signed)
Called and spoke with pt in regards to refill request for her Eliquis and referral that was placed for hematology at last OV. Asked pt if she was able to get an appt scheduled with hematology and she stated that she has not been able to.  Pt said she has been calling the main number for hematology and she states she keeps on having to leave a VM for them to contact her so she can schedule an appt.  PCCs, please advise if you are able to help [place] out with this for pt.

## 2020-10-07 NOTE — Telephone Encounter (Signed)
Doesn't look like plan was followed: - venous dopplers 01/10/20  Neg  - Echo 01/27/20 ok > refer to Hematology 01/28/2020   So make appt for hematology consult   Give enough eliquis so she can stop the drug one week prior to the appt and be sure she keeps appt

## 2020-10-07 NOTE — Telephone Encounter (Signed)
I called pt & gave her phone # for Seth Bake at [facility] - [phone].  She is going to call her.  Nothing further needed at this time.

## 2020-10-08 ENCOUNTER — Telehealth: Payer: Self-pay | Admitting: Internal Medicine

## 2020-10-08 DIAGNOSIS — I2699 Other pulmonary embolism without acute cor pulmonale: Secondary | ICD-10-CM

## 2020-10-08 NOTE — Telephone Encounter (Signed)
Hopefully she is referring to hematology re PE/ need for long term anticoagulation as I don't see a dx of cancer and if so that's fine, if cancer related that would not be my office

## 2020-10-08 NOTE — Telephone Encounter (Signed)
Called and spoke with patient. She confirmed she was asking for the hematology referral. She stated that one had been placed last year had expired. Will go ahead and place new referral.

## 2020-10-08 NOTE — Telephone Encounter (Signed)
Dr. Melvyn Novas, please advise if you are okay with [place] placing this referral for pt.

## 2020-10-09 ENCOUNTER — Telehealth: Payer: Self-pay | Admitting: Internal Medicine

## 2020-10-09 NOTE — Telephone Encounter (Signed)
Received a new hem referral from [name] for history of PE, need for long term anticoagulation. [name] has been cld and scheduled to see [name] on 2/9 at 1145am w/labs at 11:15am. Pt aware to arrive 20 minutes early.

## 2020-10-14 ENCOUNTER — Other Ambulatory Visit: Payer: Self-pay

## 2020-10-14 ENCOUNTER — Ambulatory Visit (INDEPENDENT_AMBULATORY_CARE_PROVIDER_SITE_OTHER): Payer: 59 | Admitting: Internal Medicine

## 2020-10-14 ENCOUNTER — Encounter (INDEPENDENT_AMBULATORY_CARE_PROVIDER_SITE_OTHER): Payer: Self-pay | Admitting: Internal Medicine

## 2020-10-14 ENCOUNTER — Other Ambulatory Visit (INDEPENDENT_AMBULATORY_CARE_PROVIDER_SITE_OTHER): Payer: Self-pay | Admitting: Internal Medicine

## 2020-10-14 VITALS — BP 110/70 | HR 78 | Temp 97.8°F | Resp 18 | Ht 68.0 in | Wt 189.0 lb

## 2020-10-14 DIAGNOSIS — E282 Polycystic ovarian syndrome: Secondary | ICD-10-CM | POA: Diagnosis not present

## 2020-10-14 DIAGNOSIS — F4321 Adjustment disorder with depressed mood: Secondary | ICD-10-CM

## 2020-10-14 DIAGNOSIS — N951 Menopausal and female climacteric states: Secondary | ICD-10-CM | POA: Diagnosis not present

## 2020-10-14 DIAGNOSIS — I10 Essential (primary) hypertension: Secondary | ICD-10-CM

## 2020-10-14 DIAGNOSIS — F411 Generalized anxiety disorder: Secondary | ICD-10-CM | POA: Diagnosis not present

## 2020-10-14 MED ORDER — ESCITALOPRAM OXALATE 5 MG PO TABS: 5.0000 mg | ORAL_TABLET | Freq: Every day | ORAL | 3 refills | Status: DC

## 2020-10-14 MED FILL — ESCITALOPRAM 5 MG TABLET: 5 | 30 days supply | Qty: 30 | Fill #0

## 2020-10-14 NOTE — Progress Notes (Signed)
Metrics: Intervention Frequency ACO  Documented Smoking Status Yearly  Screened one or more times in 24 months  Cessation Counseling or  Active cessation medication Past 24 months  Past 24 months   Guideline developer: UpToDate (See UpToDate for funding source) Date Released: 2014       Wellness Office Visit  Subjective:  Patient ID: [name], female    DOB: September 11, 1961  Age: 60 y.o. MRN: [id number]  CC: This lady comes in for follow-up of PCOS, menopausal symptoms, hypertension. HPI  She is having a hard time with grief reaction because of the loss of her mother approximately 6 months ago from thromboembolic disease.  Unfortunately, her husband is not very sympathetic to her loss and this is causing problems in her relationship with her husband.  They have been married now 84 years. She is tolerating all the identical hormones including estradiol, testosterone and progesterone interval as well as desiccated thyroid and she seems to be doing well with this.  She has actually managed to lose weight since her last visit. She continues with Eliquis and is due to see hematology soon to discuss how long she should continue this for. Past Medical History:  Diagnosis Date  . Anxiety state, unspecified   . DEPRESSION   . Hypertension   . Obesity (BMI 35.0-39.9 without comorbidity) 05/16/2019  . PCOS (polycystic ovarian syndrome) 05/16/2019  . Sinus tachycardia 09/10/2015  . Tear of MCL (medial collateral ligament) of knee 11/2012   left knee  . Vitamin D deficiency disease 05/16/2019   Past Surgical History:  Procedure Laterality Date  . COLONOSCOPY  2015  . DILATATION & CURRETTAGE/HYSTEROSCOPY WITH RESECTOCOPE  08-11-2004   removal polyps  . HAMMER TOE SURGERY Bilateral 03-22-2006   fifth toe  . KNEE ARTHROSCOPY WITH MEDIAL COLLATERAL LIGAMENT RECONSTRUCTION Left 12/18/2012   Procedure: KNEE ARTHROSCOPY WITH OPEN MEDIAL COLLATERAL LIGAMENT RECONSTRUCTION WITH ALLOGRAFT,ANTERIOR  CRUCIATE LIGAMENT RESCONSTRUCTION WITH ALLOGRAFT, PARTIAL MEDIALMENISECTOMY/DEBRIDEMENT CHONDROPLASTY;  Surgeon: Sydnee Cabal, MD;  Location: [place];  Service: Orthopedics;  Laterality: Left;     Family History  Problem Relation Age of Onset  . Hypertension Mother   . Kidney cancer Mother   . Hypertension Father   . Diabetes Maternal Grandmother        amputation  . Colon cancer Neg Hx   . Rectal cancer Neg Hx   . Stomach cancer Neg Hx     Social History   Social History Narrative   Married for 23 years,second.Lives with husband.Works at [employer].   Social History   Tobacco Use  . Smoking status: Former Smoker    Packs/day: 0.25    Years: 6.00    Pack years: 1.50    Types: Cigarettes    Quit date: 09/13/1995    Years since quitting: 25.1  . Smokeless tobacco: Never Used  . Tobacco comment: smoked socially on the weekends  Substance Use Topics  . Alcohol use: Yes    Comment: White wine     Current Meds  Medication Sig  . ALPRAZolam (XANAX) 0.25 MG tablet TAKE 1 TABLET BY MOUTH ONCE DAILY AT BEDTIME AS NEEDED FOR ANXIETY  . apixaban (ELIQUIS) 5 MG TABS tablet Take 1 tablet (5 mg total) by mouth 2 (two) times daily.  Marland Kitchen escitalopram (LEXAPRO) 5 MG tablet Take 1 tablet (5 mg total) by mouth daily.  Marland Kitchen estradiol (ESTRACE) 1 MG tablet Take 1 tablet (1 mg total) by mouth daily.  . NONFORMULARY OR COMPOUNDED  ITEM Inject 5 mg into the muscle every 7 (seven) days. Testosterone cypionate in olive  oil (25 mg/mL).  Dispense 5 mL vial.  . olmesartan-hydrochlorothiazide (BENICAR HCT) 40-12.5 MG tablet Take 0.5 tablets by mouth daily.  . progesterone (PROMETRIUM) 200 MG capsule Take 2 capsules (400 mg total) by mouth every evening.  . thyroid (ARMOUR) 120 MG tablet Take 1 tablet (120 mg total) by mouth daily before breakfast.      Depression screen [facility] 2/9 12/04/2019  Decreased Interest 0  Down, Depressed, Hopeless 0  PHQ - 2 Score 0      Objective:   Today's Vitals: BP 110/70 (BP Location: Right Arm, Patient Position: Sitting, Cuff Size: Normal)   Pulse 78   Temp 97.8 F (36.6 C) (Temporal)   Resp 18   Ht 5\' 8"  (1.727 m)   Wt 189 lb (85.7 kg)   LMP 06/18/2015   SpO2 98%   BMI 28.74 kg/m  Vitals with BMI 10/14/2020 08/04/2020 05/12/2020  Height 5\' 8"  5\' 5"  5\' 5"   Weight 189 lbs 198 lbs 5 oz 201 lbs 6 oz  BMI 62.83 33 15.17  Systolic 616 073 710  Diastolic 70 82 80  Pulse 78 - 97     Physical Exam   She remains overweight but is not obese anymore and has lost 9 pounds.  Blood pressure is excellent.    Assessment   1. PCOS (polycystic ovarian syndrome)   2. Hot flashes due to menopause   3. Essential hypertension   4. Anxiety state   5. Grief reaction       Tests ordered Orders Placed This Encounter  Procedures  . Estradiol  . Progesterone  . Testos,Total,Free and SHBG (Female)  . T3, free  . TSH     Plan: 1. She will continue with estradiol, progesterone, testosterone and desiccated thyroid and we will check levels today. 2. She will continue with Benicar HCT for hypertension which is controlling her blood pressure very well. 3. She seems to be getting quite sad and depressed with her grief reaction and I have strongly encouraged her to get therapy.  In the meantime I will start her on Lexapro at a low dose and have advised her of possible side effects. 4. Follow-up in about 6 weeks for an annual physical exam   Meds ordered this encounter  Medications  . escitalopram (LEXAPRO) 5 MG tablet    Sig: Take 1 tablet (5 mg total) by mouth daily.    Dispense:  30 tablet    Refill:  3    Clelia Trabucco Luther Parody, MD

## 2020-10-17 LAB — TSH: TSH: 0.01 mIU/L — ABNORMAL LOW (ref 0.40–4.50)

## 2020-10-17 LAB — PROGESTERONE: Progesterone: 20.7 ng/mL

## 2020-10-17 LAB — TESTOS,TOTAL,FREE AND SHBG (FEMALE)
Free Testosterone: 1.2 pg/mL (ref 0.1–6.4)
Sex Hormone Binding: 210 nmol/L — ABNORMAL HIGH (ref 14–73)
Testosterone, Total, LC-MS-MS: 29 ng/dL (ref 2–45)

## 2020-10-17 LAB — T3, FREE: T3, Free: 4.2 pg/mL (ref 2.3–4.2)

## 2020-10-17 LAB — ESTRADIOL: Estradiol: 72 pg/mL

## 2020-10-21 ENCOUNTER — Inpatient Hospital Stay: Payer: 59 | Attending: Internal Medicine | Admitting: Internal Medicine

## 2020-10-21 ENCOUNTER — Inpatient Hospital Stay: Payer: 59

## 2020-10-21 ENCOUNTER — Other Ambulatory Visit: Payer: Self-pay

## 2020-10-21 ENCOUNTER — Encounter: Payer: Self-pay | Admitting: Internal Medicine

## 2020-10-21 VITALS — BP 143/86 | HR 103 | Temp 98.3°F | Resp 20 | Ht 68.0 in | Wt 197.1 lb

## 2020-10-21 DIAGNOSIS — I10 Essential (primary) hypertension: Secondary | ICD-10-CM

## 2020-10-21 DIAGNOSIS — I2699 Other pulmonary embolism without acute cor pulmonale: Secondary | ICD-10-CM

## 2020-10-21 NOTE — Progress Notes (Signed)
[place] Telephone:([phone]   [phone]  CONSULT NOTE  REFERRING PHYSICIAN: Dr. Hurshel Party  REASON FOR CONSULTATION:  60 years old African-American female with history of pulmonary embolism  HPI [name] is a 60 y.o. female with past medical history significant for anxiety/depression, hypertension, polycystic ovarian syndrome, sinus tachycardia as well as vitamin D deficiency.  The patient mentioned that and January 2021 she presented to the hospital complaining of shortness of breath as well as back pain.  She was diagnosed with Covid at that time.  During her evaluation she had a chest x-ray followed by CT angiogram of the chest on October 16, 2019 and it showed marked severity bilateral pulmonary metastasis involving predominantly the upper lobe and lower lobe branches of the left pulmonary artery.  The patient was started on treatment with Eliquis.  She has Doppler of the lower extremities 2 months later that was negative for deep venous thrombosis.  She mentions that there are few family members with history of blood clots including 2 uncles with deep venous thrombosis and her mother had embolic stroke.  She was told that there there is a deficiency of protein S in her family but her sister was tested and that was negative.  The patient is tolerating her treatment with Eliquis fairly well with no concerning adverse effects.  She has no bleeding, bruises or ecchymosis.  She has no swelling of the lower extremities.  She denied having any chest pain, shortness of breath, cough or hemoptysis.  She has no nausea, vomiting, diarrhea or constipation.  She intentionally lost around 56 pound last year and she felt much better. Family history significant for mother with stroke, hypertension and kidney cancer.  Father had hypertension and 2 uncles has deep venous thrombosis. The patient is married and has 3 children.  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  She has remote history of few years of smoking during college but quit 20 years ago.  She drinks alcohol occasionally and no history of drug abuse.   Past Medical History:  Diagnosis Date  . Anxiety state, unspecified   . DEPRESSION   . Hypertension   . Obesity (BMI 35.0-39.9 without comorbidity) 05/16/2019  . PCOS (polycystic ovarian syndrome) 05/16/2019  . Sinus tachycardia 09/10/2015  . Tear of MCL (medial collateral ligament) of knee 11/2012   left knee  . Vitamin D deficiency disease 05/16/2019    Past Surgical History:  Procedure Laterality Date  . COLONOSCOPY  2015  . DILATATION & CURRETTAGE/HYSTEROSCOPY WITH RESECTOCOPE  08-11-2004   removal polyps  . HAMMER TOE SURGERY Bilateral 03-22-2006   fifth toe  . KNEE ARTHROSCOPY WITH MEDIAL COLLATERAL LIGAMENT RECONSTRUCTION Left 12/18/2012   Procedure: KNEE ARTHROSCOPY WITH OPEN MEDIAL COLLATERAL LIGAMENT RECONSTRUCTION WITH ALLOGRAFT,ANTERIOR CRUCIATE LIGAMENT RESCONSTRUCTION WITH ALLOGRAFT, PARTIAL MEDIALMENISECTOMY/DEBRIDEMENT CHONDROPLASTY;  Surgeon: Sydnee Cabal, MD;  Location: [place];  Service: Orthopedics;  Laterality: Left;    Family History  Problem Relation Age of Onset  . Hypertension Mother   . Kidney cancer Mother   . Hypertension Father   . Diabetes Maternal Grandmother        amputation  . Colon cancer Neg Hx   . Rectal cancer Neg Hx   . Stomach cancer Neg Hx     Social History Social History   Tobacco Use  . Smoking status: Former Smoker    Packs/day: 0.25    Years: 6.00    Pack years: 1.50  Types: Cigarettes    Quit date: 09/13/1995    Years since quitting: 25.1  . Smokeless tobacco: Never Used  . Tobacco comment: smoked socially on the weekends  Vaping Use  . Vaping Use: Never used  Substance Use Topics  . Alcohol use: Yes    Comment: White wine   . Drug use: No    Allergies  Allergen Reactions  . Oxycodone-Acetaminophen Itching    TYLOX ALSO     Current Outpatient Medications  Medication Sig Dispense Refill  . ALPRAZolam (XANAX) 0.25 MG tablet TAKE 1 TABLET BY MOUTH ONCE DAILY AT BEDTIME AS NEEDED FOR ANXIETY 30 tablet 1  . apixaban (ELIQUIS) 5 MG TABS tablet Take 1 tablet (5 mg total) by mouth 2 (two) times daily. 60 tablet 2  . escitalopram (LEXAPRO) 5 MG tablet Take 1 tablet (5 mg total) by mouth daily. 30 tablet 3  . estradiol (ESTRACE) 1 MG tablet Take 1 tablet (1 mg total) by mouth daily. 90 tablet 1  . NONFORMULARY OR COMPOUNDED ITEM Inject 5 mg into the muscle every 7 (seven) days. Testosterone cypionate in olive  oil (25 mg/mL).  Dispense 5 mL vial. 1 each 3  . olmesartan-hydrochlorothiazide (BENICAR HCT) 40-12.5 MG tablet Take 0.5 tablets by mouth daily. 45 tablet 1  . progesterone (PROMETRIUM) 200 MG capsule Take 2 capsules (400 mg total) by mouth every evening. 60 capsule 0  . thyroid (ARMOUR) 120 MG tablet Take 1 tablet (120 mg total) by mouth daily before breakfast. 90 tablet 0   No current facility-administered medications for this visit.    Review of Systems  Constitutional: negative Eyes: negative Ears, nose, mouth, throat, and face: negative Respiratory: negative Cardiovascular: negative Gastrointestinal: negative Genitourinary:negative Integument/breast: negative Hematologic/lymphatic: negative Musculoskeletal:negative Neurological: negative Behavioral/Psych: negative Endocrine: negative Allergic/Immunologic: negative  Physical Exam  HQP:RFFMB, healthy, no distress, well nourished, well developed and anxious SKIN: skin color, texture, turgor are normal, no rashes or significant lesions HEAD: Normocephalic, No masses, lesions, tenderness or abnormalities EYES: normal, PERRLA, Conjunctiva are pink and non-injected EARS: External ears normal, Canals clear OROPHARYNX:no exudate, no erythema and lips, buccal mucosa, and tongue normal  NECK: supple, no adenopathy, no JVD LYMPH:  no palpable  lymphadenopathy, no hepatosplenomegaly BREAST:not examined LUNGS: clear to auscultation , and palpation HEART: regular rate & rhythm, no murmurs and no gallops ABDOMEN:abdomen soft, non-tender, normal bowel sounds and no masses or organomegaly BACK: No CVA tenderness, Range of motion is normal EXTREMITIES:no joint deformities, effusion, or inflammation, no edema  NEURO: alert & oriented x 3 with fluent speech, no focal motor/sensory deficits  PERFORMANCE STATUS: ECOG 1  LABORATORY DATA: Lab Results  Component Value Date   WBC 6.6 04/04/2020   HGB 13.1 04/04/2020   HCT 41.4 04/04/2020   MCV 90.6 04/04/2020   PLT 274 04/04/2020      Chemistry      Component Value Date/Time   NA 141 04/04/2020 1909   K 3.7 04/04/2020 1909   CL 108 04/04/2020 1909   CO2 22 04/04/2020 1909   BUN 13 04/04/2020 1909   CREATININE 0.84 04/04/2020 1909   CREATININE 0.85 12/04/2019 1209      Component Value Date/Time   CALCIUM 9.4 04/04/2020 1909   ALKPHOS 56 04/04/2020 1909   AST 15 04/04/2020 1909   ALT 15 04/04/2020 1909   BILITOT 0.6 04/04/2020 1909       RADIOGRAPHIC STUDIES: No results found.  ASSESSMENT: This is a very pleasant 60 years old African-American female  diagnosed with pulmonary embolism involving the upper and lower lobe left pulmonary artery in February 2021 after Covid infection.  The patient has been on treatment with Eliquis for almost a year.  She mentions some family history of deep venous thrombosis and questionable protein S deficiency and family members but her sister was tested and it was negative.   PLAN: I had a lengthy discussion with the patient today about her condition.  Her pulmonary embolism was likely precipitated by the Covid infection during that time.  She completed 1 year treatment with Eliquis. I recommended for the patient to discontinue Eliquis at this point.  I will repeat hypercoagulable panel in 1 months to rule out any underlying congenital or  acquired abnormality.  If the patient has no abnormality she will continue on observation but if she has any concerning abnormality she may need to resume her treatment with anticoagulation for life. The patient agreed to the current plan.  We will call her with the result once it becomes available to [place] and we will give her further recommendation at that time. She was advised to call immediately if she has any concerning symptoms in the interval.  The patient voices understanding of current disease status and treatment options and is in agreement with the current care plan.  All questions were answered. The patient knows to call the clinic with any problems, questions or concerns. We can certainly see the patient much sooner if necessary.  Thank you so much for allowing me to participate in the care of [name]. I will continue to follow up the patient with you and assist in her care.  The total time spent in the appointment was 60 minutes.  Disclaimer: This note was dictated with voice recognition software. Similar sounding words can inadvertently be transcribed and may not be corrected upon review.   Eilleen Kempf October 21, 2020, 12:13 PM

## 2020-11-04 ENCOUNTER — Telehealth: Payer: Self-pay | Admitting: Internal Medicine

## 2020-11-04 NOTE — Telephone Encounter (Signed)
Scheduled per 02/09 los, patient has been called and notified.  °

## 2020-11-05 ENCOUNTER — Other Ambulatory Visit (INDEPENDENT_AMBULATORY_CARE_PROVIDER_SITE_OTHER): Payer: Self-pay | Admitting: Internal Medicine

## 2020-11-05 MED FILL — ARMOUR THYROID 120 MG TAB: 120 | 90 days supply | Qty: 90 | Fill #1

## 2020-11-05 MED FILL — OLMESARTAN-HCTZ 40-12.5 MG: 40-12.5 | 90 days supply | Qty: 45 | Fill #0

## 2020-11-11 MED FILL — ESCITALOPRAM 5 MG TABLET: 5 | 30 days supply | Qty: 30 | Fill #1

## 2020-11-12 ENCOUNTER — Telehealth: Payer: Self-pay | Admitting: Medical Oncology

## 2020-11-12 NOTE — Telephone Encounter (Signed)
Reuests to r/s lab appt - appt changed.

## 2020-11-17 ENCOUNTER — Inpatient Hospital Stay: Payer: 59

## 2020-11-18 ENCOUNTER — Other Ambulatory Visit: Payer: 59

## 2020-11-24 ENCOUNTER — Other Ambulatory Visit (INDEPENDENT_AMBULATORY_CARE_PROVIDER_SITE_OTHER): Payer: Self-pay | Admitting: Internal Medicine

## 2020-11-24 DIAGNOSIS — F411 Generalized anxiety disorder: Secondary | ICD-10-CM

## 2020-11-25 ENCOUNTER — Telehealth: Payer: Self-pay | Admitting: Internal Medicine

## 2020-11-25 ENCOUNTER — Telehealth: Payer: Self-pay | Admitting: Medical Oncology

## 2020-11-25 NOTE — Telephone Encounter (Signed)
03/08-appt cancelled due to Covid exposure -Please r/s appt . Schedule message sent.

## 2020-11-25 NOTE — Telephone Encounter (Signed)
Scheduled appt per 3/15 sch msg. Pt. Aware.

## 2020-11-26 ENCOUNTER — Other Ambulatory Visit: Payer: Self-pay

## 2020-11-26 ENCOUNTER — Inpatient Hospital Stay: Payer: 59 | Attending: Internal Medicine

## 2020-11-26 DIAGNOSIS — Z86711 Personal history of pulmonary embolism: Secondary | ICD-10-CM | POA: Insufficient documentation

## 2020-11-26 DIAGNOSIS — Z7901 Long term (current) use of anticoagulants: Secondary | ICD-10-CM | POA: Diagnosis not present

## 2020-11-26 DIAGNOSIS — I2699 Other pulmonary embolism without acute cor pulmonale: Secondary | ICD-10-CM

## 2020-11-26 LAB — ANTITHROMBIN III: AntiThromb III Func: 103 % (ref 75–120)

## 2020-11-27 LAB — BETA-2-GLYCOPROTEIN I ABS, IGG/M/A
Beta-2 Glyco I IgG: <9 GPI IgG units (ref 0–20)
Beta-2-Glycoprotein I IgA: <9 GPI IgA units (ref 0–25)
Beta-2-Glycoprotein I IgM: <9 GPI IgM units (ref 0–32)

## 2020-11-27 LAB — CARDIOLIPIN ANTIBODIES, IGG, IGM, IGA
Anticardiolipin IgA: <9 APL U/mL (ref 0–11)
Anticardiolipin IgG: <9 GPL U/mL (ref 0–14)
Anticardiolipin IgM: <9 MPL U/mL (ref 0–12)

## 2020-11-27 LAB — HOMOCYSTEINE: Homocysteine: 14.3 umol/L (ref 0.0–14.5)

## 2020-11-27 LAB — LUPUS ANTICOAGULANT PANEL
DRVVT: 31.4 s (ref 0.0–47.0)
PTT Lupus Anticoagulant: 30.3 s (ref 0.0–51.9)

## 2020-11-27 LAB — PROTEIN C ACTIVITY: Protein C Activity: 145 % (ref 73–180)

## 2020-11-27 LAB — PROTEIN S ACTIVITY: Protein S Activity: 89 % (ref 63–140)

## 2020-11-27 LAB — PROTEIN C, TOTAL: Protein C, Total: 120 % (ref 60–150)

## 2020-11-27 LAB — PROTEIN S, TOTAL: Protein S Ag, Total: 70 % (ref 60–150)

## 2020-12-03 LAB — PROTHROMBIN GENE MUTATION

## 2020-12-03 LAB — FACTOR 5 LEIDEN

## 2020-12-04 ENCOUNTER — Other Ambulatory Visit (INDEPENDENT_AMBULATORY_CARE_PROVIDER_SITE_OTHER): Payer: Self-pay | Admitting: Internal Medicine

## 2020-12-04 MED FILL — ESTRADIOL 1 MG TABS: 1 | 30 days supply | Qty: 30 | Fill #3

## 2020-12-07 ENCOUNTER — Telehealth (INDEPENDENT_AMBULATORY_CARE_PROVIDER_SITE_OTHER): Payer: Self-pay

## 2020-12-08 NOTE — Telephone Encounter (Signed)
Error

## 2020-12-09 ENCOUNTER — Encounter (INDEPENDENT_AMBULATORY_CARE_PROVIDER_SITE_OTHER): Payer: 59 | Admitting: Internal Medicine

## 2020-12-15 ENCOUNTER — Telehealth: Payer: Self-pay | Admitting: Medical Oncology

## 2020-12-15 NOTE — Telephone Encounter (Signed)
F/u coag/specilaty test. She does not understand the lab results from last month.     Can she discontinue Eloquis?

## 2020-12-15 NOTE — Telephone Encounter (Signed)
There is no abnormality in the hypercoagulable panel.  Just she can discontinue Eliquis.  She still has risk for blood clots but similar to the general population.  Thank you.

## 2020-12-17 ENCOUNTER — Other Ambulatory Visit (HOSPITAL_COMMUNITY): Payer: Self-pay

## 2020-12-17 NOTE — Telephone Encounter (Signed)
Coag Lab resultsEloquis-Pt notified of Dr Worthy Flank note. Palmer  said  she will call Dr Melvyn Novas with this information and ask to discontinue the Eloquis off  on her med list.

## 2020-12-18 ENCOUNTER — Other Ambulatory Visit (HOSPITAL_COMMUNITY): Payer: Self-pay

## 2020-12-18 MED FILL — Escitalopram Oxalate Tab 5 MG (Base Equiv): ORAL | 30 days supply | Qty: 30 | Fill #0 | Status: AC

## 2021-01-14 ENCOUNTER — Other Ambulatory Visit (HOSPITAL_COMMUNITY): Payer: Self-pay

## 2021-01-14 MED FILL — Alprazolam Tab 0.25 MG: ORAL | 30 days supply | Qty: 30 | Fill #0 | Status: AC

## 2021-01-20 ENCOUNTER — Other Ambulatory Visit (HOSPITAL_COMMUNITY): Payer: Self-pay

## 2021-01-20 MED FILL — Estradiol Tab 1 MG: ORAL | 90 days supply | Qty: 90 | Fill #0 | Status: AC

## 2021-01-26 ENCOUNTER — Other Ambulatory Visit (HOSPITAL_COMMUNITY): Payer: Self-pay

## 2021-01-26 MED FILL — Escitalopram Oxalate Tab 5 MG (Base Equiv): ORAL | 30 days supply | Qty: 30 | Fill #1 | Status: AC

## 2021-02-01 IMAGING — DX DG CHEST 2V
2 series · 2 of 2 positions shown · non-contrast
Comparison: Chest x-ray 10/16/2019

CLINICAL DATA: History of pulmonary embolism.

EXAM:
CHEST - 2 VIEW

[chest pa]
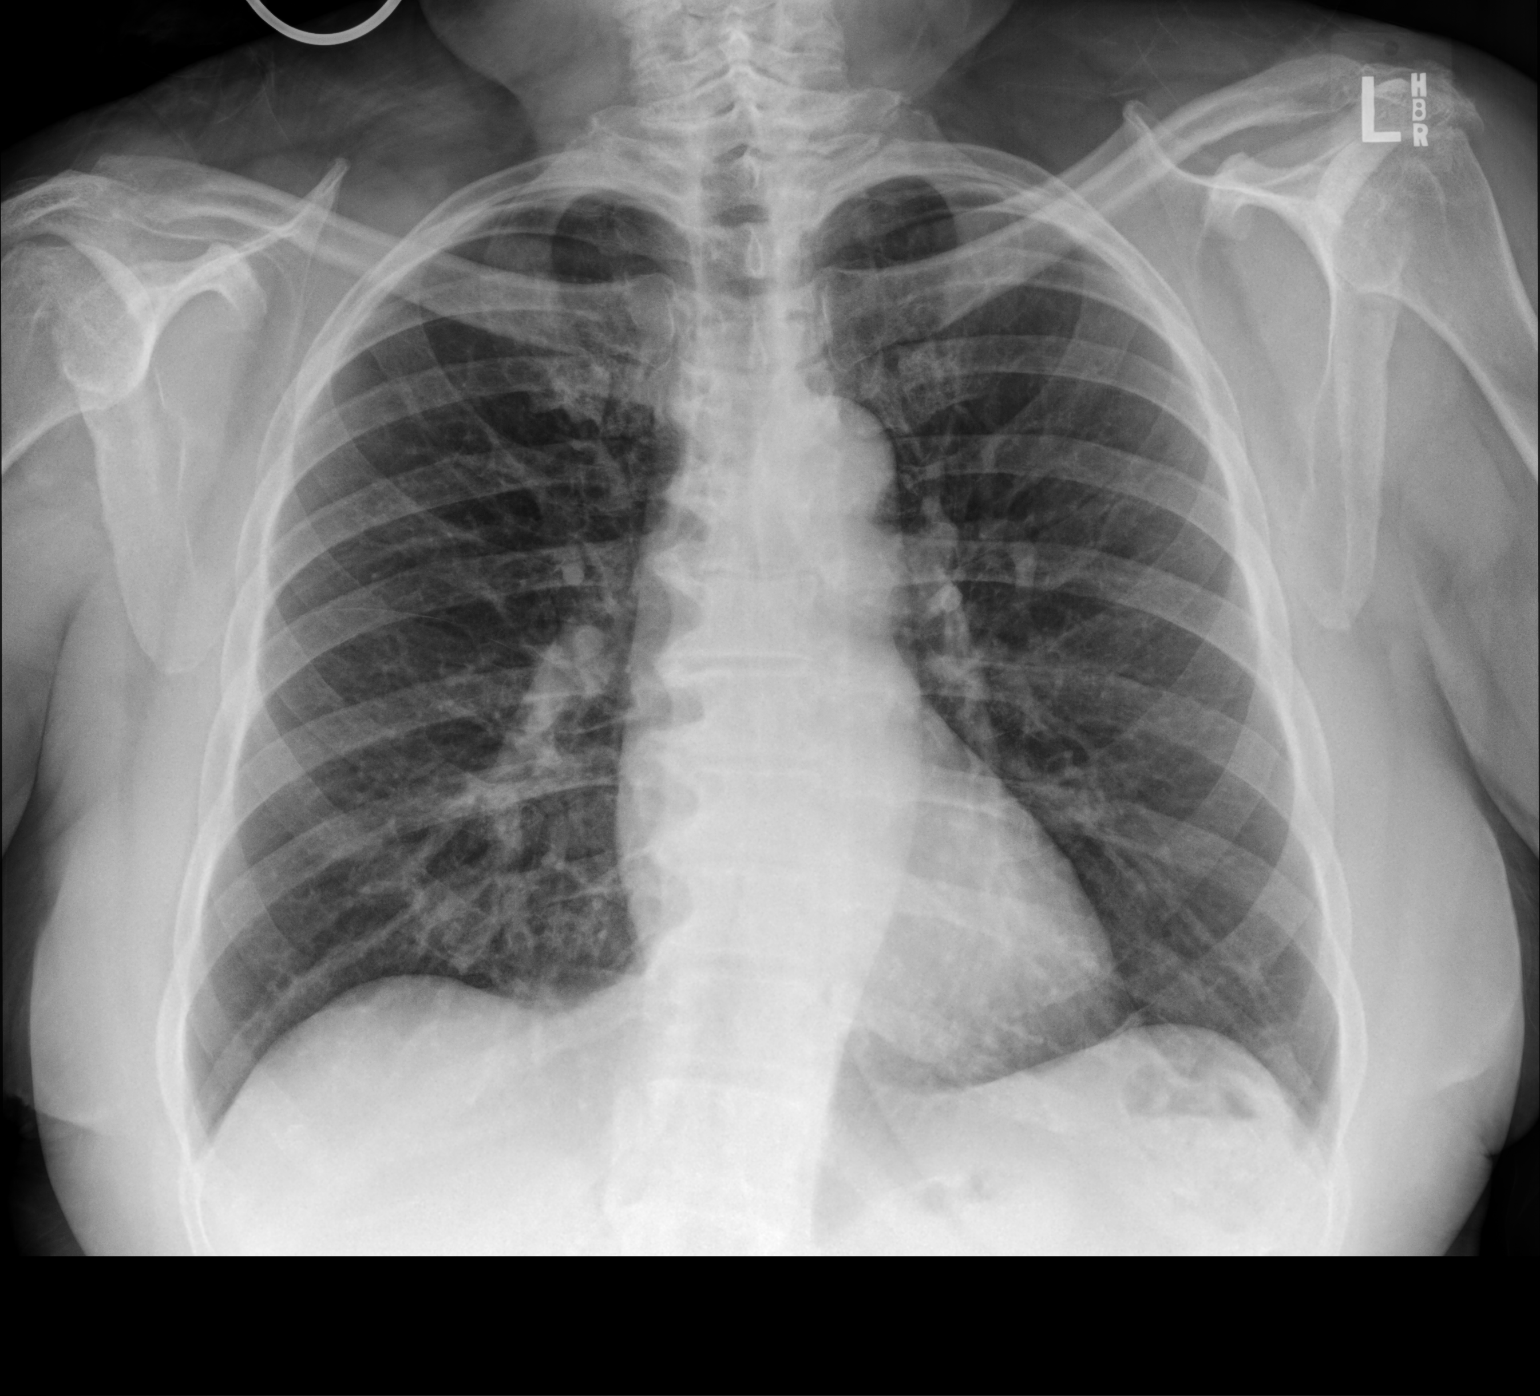

[chest lat]
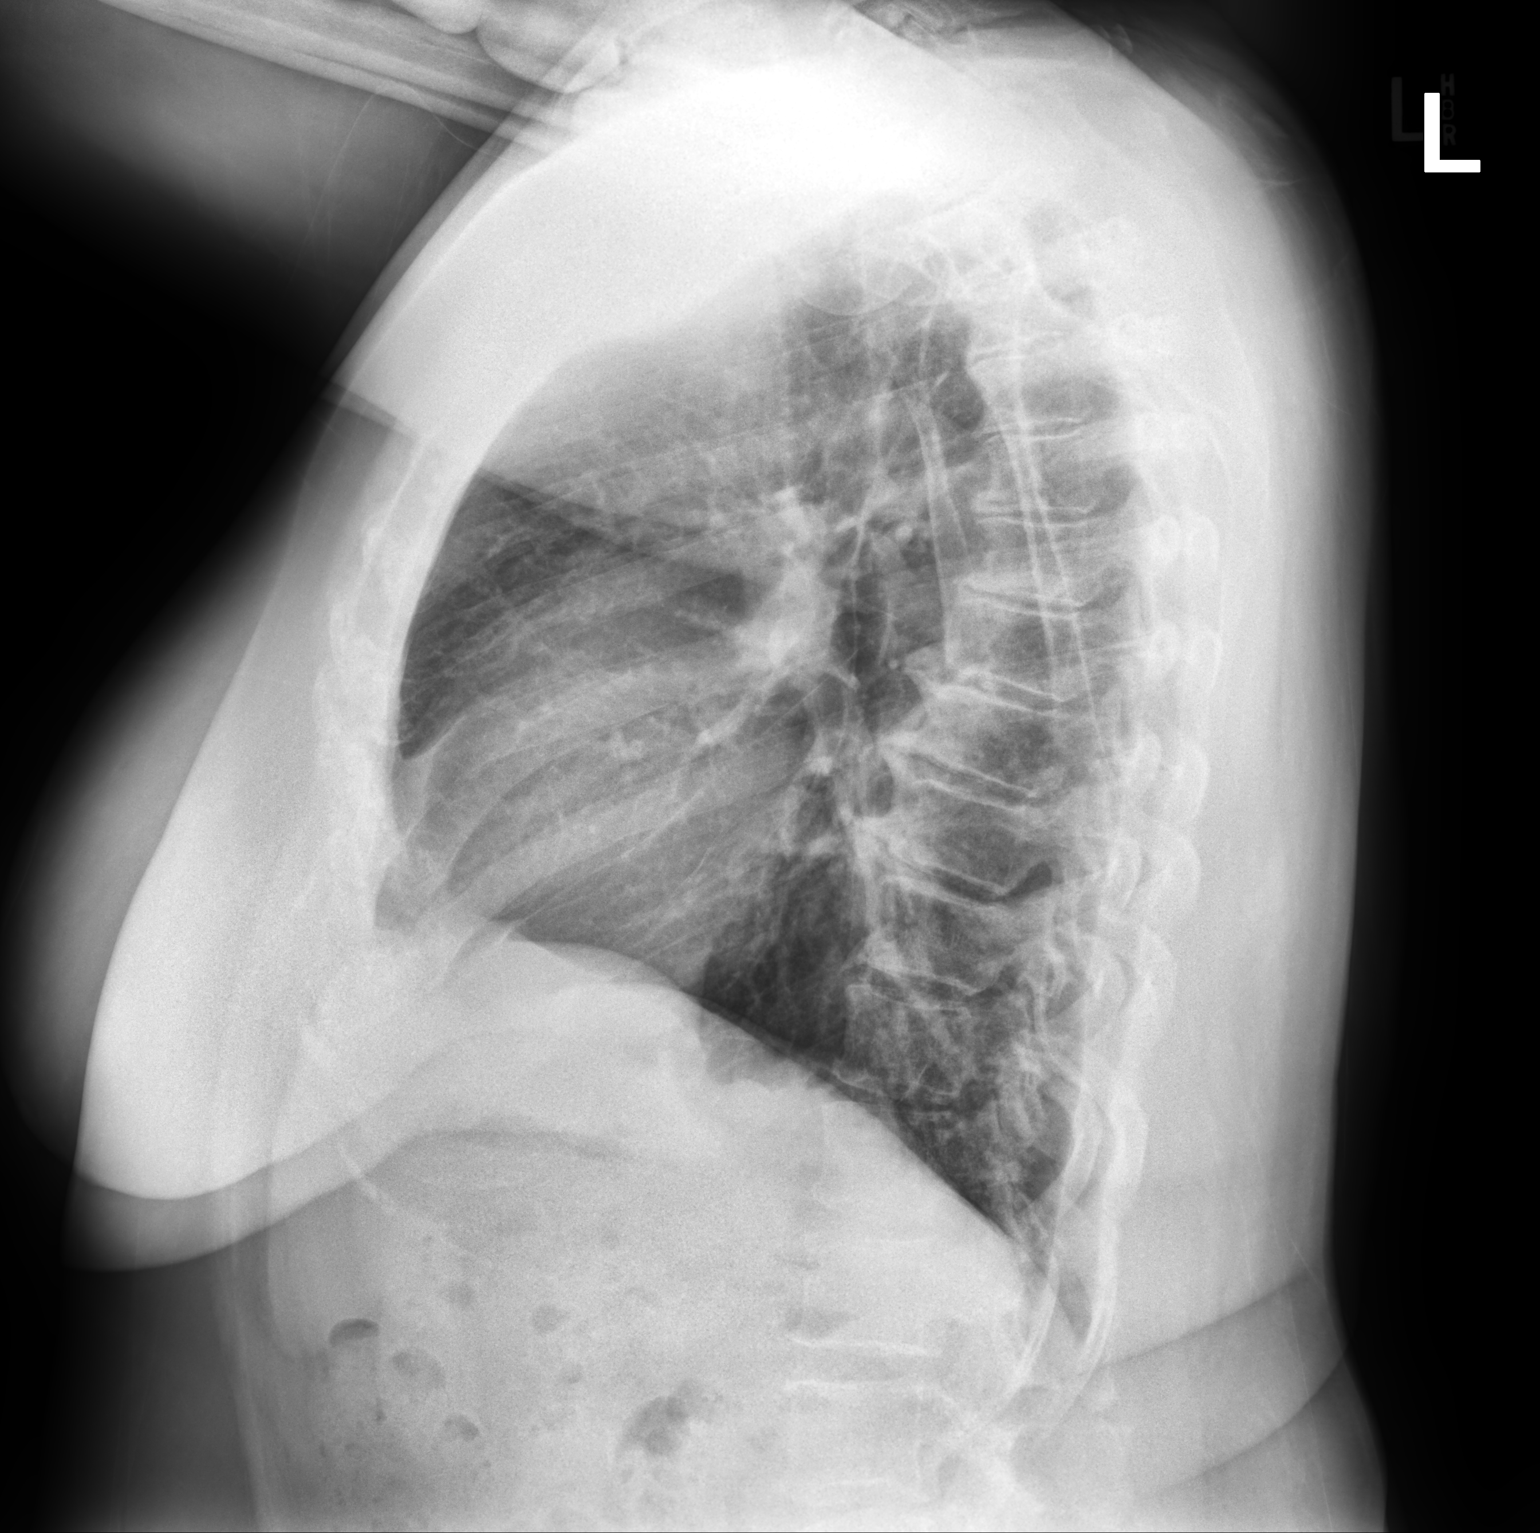

[2 of 2 positions shown; findings below may reference images not displayed]

FINDINGS: The cardiac silhouette, mediastinal and hilar contours are within
normal limits. The lungs are clear. No pleural effusions. The bony
thorax is intact.
IMPRESSION: No acute cardiopulmonary findings.

## 2021-02-23 ENCOUNTER — Other Ambulatory Visit (INDEPENDENT_AMBULATORY_CARE_PROVIDER_SITE_OTHER): Payer: Self-pay | Admitting: Internal Medicine

## 2021-02-23 ENCOUNTER — Other Ambulatory Visit (HOSPITAL_COMMUNITY): Payer: Self-pay

## 2021-02-23 MED ORDER — PROGESTERONE 200 MG PO CAPS
400.0000 mg | ORAL_CAPSULE | Freq: Every evening | ORAL | 2 refills | Status: DC
  Filled 2021-02-23: qty 60, 30d supply, fill #0
  Filled 2021-04-05: qty 60, 30d supply, fill #1
  Filled 2021-04-21: qty 60, 30d supply, fill #2

## 2021-02-23 MED ORDER — ESCITALOPRAM OXALATE 5 MG PO TABS
ORAL_TABLET | Freq: Every day | ORAL | 3 refills | Status: DC
  Filled 2021-02-23: qty 30, fill #0
  Filled 2021-03-08: qty 30, 30d supply, fill #0
  Filled 2021-04-05: qty 30, 30d supply, fill #1
  Filled 2021-04-21: qty 30, 30d supply, fill #2

## 2021-02-23 MED ORDER — THYROID 120 MG PO TABS
120.0000 mg | ORAL_TABLET | Freq: Every day | ORAL | 0 refills | Status: DC
  Filled 2021-02-23: qty 90, 90d supply, fill #0

## 2021-02-23 MED FILL — Olmesartan Medoxomil-Hydrochlorothiazide Tab 40-12.5 MG: ORAL | 90 days supply | Qty: 45 | Fill #0 | Status: AC

## 2021-03-02 ENCOUNTER — Ambulatory Visit (INDEPENDENT_AMBULATORY_CARE_PROVIDER_SITE_OTHER): Payer: 59 | Admitting: Internal Medicine

## 2021-03-02 ENCOUNTER — Other Ambulatory Visit: Payer: Self-pay

## 2021-03-02 ENCOUNTER — Encounter (INDEPENDENT_AMBULATORY_CARE_PROVIDER_SITE_OTHER): Payer: Self-pay | Admitting: Internal Medicine

## 2021-03-02 ENCOUNTER — Other Ambulatory Visit (HOSPITAL_COMMUNITY): Payer: Self-pay

## 2021-03-02 VITALS — BP 126/74 | HR 117 | Temp 97.9°F | Ht 65.5 in | Wt 199.2 lb

## 2021-03-02 DIAGNOSIS — R5381 Other malaise: Secondary | ICD-10-CM | POA: Diagnosis not present

## 2021-03-02 DIAGNOSIS — N951 Menopausal and female climacteric states: Secondary | ICD-10-CM

## 2021-03-02 DIAGNOSIS — I10 Essential (primary) hypertension: Secondary | ICD-10-CM

## 2021-03-02 DIAGNOSIS — E669 Obesity, unspecified: Secondary | ICD-10-CM

## 2021-03-02 DIAGNOSIS — R5383 Other fatigue: Secondary | ICD-10-CM | POA: Diagnosis not present

## 2021-03-02 DIAGNOSIS — Z0001 Encounter for general adult medical examination with abnormal findings: Secondary | ICD-10-CM

## 2021-03-02 DIAGNOSIS — E282 Polycystic ovarian syndrome: Secondary | ICD-10-CM

## 2021-03-02 DIAGNOSIS — Z131 Encounter for screening for diabetes mellitus: Secondary | ICD-10-CM | POA: Diagnosis not present

## 2021-03-02 DIAGNOSIS — E559 Vitamin D deficiency, unspecified: Secondary | ICD-10-CM | POA: Diagnosis not present

## 2021-03-02 DIAGNOSIS — Z1231 Encounter for screening mammogram for malignant neoplasm of breast: Secondary | ICD-10-CM | POA: Diagnosis not present

## 2021-03-02 MED ORDER — TESTOSTERONE CYPIONATE 100 MG/ML IM SOLN
INTRAMUSCULAR | 2 refills | Status: DC
  Filled 2021-03-02: qty 10, 30d supply, fill #0

## 2021-03-02 NOTE — Progress Notes (Deleted)
Metrics: Intervention Frequency ACO  Documented Smoking Status Yearly  Screened one or more times in 24 months  Cessation Counseling or  Active cessation medication Past 24 months  Past 24 months   Guideline developer: UpToDate (See UpToDate for funding source) Date Released: 2014       Wellness Office Visit  Subjective:  Patient ID: [name], female    DOB: 31-Jan-1961  Age: 60 y.o. MRN: [id number]  CC:  HPI   Past Medical History:  Diagnosis Date   Anxiety state, unspecified    DEPRESSION    Hypertension    Obesity (BMI 35.0-39.9 without comorbidity) 05/16/2019   PCOS (polycystic ovarian syndrome) 05/16/2019   Sinus tachycardia 09/10/2015   Tear of MCL (medial collateral ligament) of knee 11/2012   left knee   Vitamin D deficiency disease 05/16/2019   Past Surgical History:  Procedure Laterality Date   COLONOSCOPY  2015   DILATATION & CURRETTAGE/HYSTEROSCOPY WITH RESECTOCOPE  08-11-2004   removal polyps   HAMMER TOE SURGERY Bilateral 03-22-2006   fifth toe   KNEE ARTHROSCOPY WITH MEDIAL COLLATERAL LIGAMENT RECONSTRUCTION Left 12/18/2012   Procedure: KNEE ARTHROSCOPY WITH OPEN MEDIAL COLLATERAL LIGAMENT RECONSTRUCTION WITH ALLOGRAFT,ANTERIOR CRUCIATE LIGAMENT RESCONSTRUCTION WITH ALLOGRAFT, PARTIAL MEDIALMENISECTOMY/DEBRIDEMENT CHONDROPLASTY;  Surgeon: Sydnee Cabal, MD;  Location: [place];  Service: Orthopedics;  Laterality: Left;     Family History  Problem Relation Age of Onset   Hypertension Mother    Kidney cancer Mother    Hypertension Father    Diabetes Maternal Grandmother        amputation   Colon cancer Neg Hx    Rectal cancer Neg Hx    Stomach cancer Neg Hx     Social History   Social History Narrative   Married for 97 years,second.Lives with husband.Works at [employer].   Social History   Tobacco Use   Smoking status: Former    Packs/day: 0.25    Years: 6.00    Pack years: 1.50    Types: Cigarettes     Quit date: 09/13/1995    Years since quitting: 25.4   Smokeless tobacco: Never   Tobacco comments:    smoked socially on the weekends  Substance Use Topics   Alcohol use: Yes    Comment: White wine     Current Meds  Medication Sig   ALPRAZolam (XANAX) 0.25 MG tablet TAKE 1 TABLET BY MOUTH ONCE DAILY AT BEDTIME AS NEEDED FOR ANXIETY   escitalopram (LEXAPRO) 5 MG tablet TAKE 1 TABLET BY MOUTH ONCE A DAY   estradiol (ESTRACE) 1 MG tablet TAKE 1 TABLET BY MOUTH ONCE A DAY   NONFORMULARY OR COMPOUNDED ITEM Inject 5 mg into the muscle every 7 (seven) days. Testosterone cypionate in olive  oil (25 mg/mL).  Dispense 5 mL vial.   olmesartan-hydrochlorothiazide (BENICAR HCT) 40-12.5 MG tablet TAKE 1/2 TABLET BY MOUTH DAILY *NEEDS OFFICE VISIT   progesterone (PROMETRIUM) 200 MG capsule Take 2 capsules (400 mg total) by mouth every evening.   thyroid (ARMOUR) 120 MG tablet Take 1 tablet (120 mg total) by mouth daily before breakfast.   Testosterone Cypionate 100 MG/ML SOLN Inject 0.1 mLs as directed once a week.       Objective:   Today's Vitals: BP 126/74   Pulse (!) 117   Temp 97.9 F (36.6 C) (Temporal)   Ht 5' 5.5" (1.664 m)   Wt 199 lb 3.2 oz (90.4 kg)   LMP 06/18/2015   SpO2 98%  BMI 32.64 kg/m  Vitals with BMI 03/02/2021 10/21/2020 10/14/2020  Height 5' 5.5" 5\' 8"  5\' 8"   Weight 199 lbs 3 oz 197 lbs 2 oz 189 lbs  BMI 32.63 28.78 67.67  Systolic 209 470 962  Diastolic 74 86 70  Pulse 836 103 78     Physical Exam       Assessment   1. Essential hypertension   2. Hot flashes due to menopause   3. PCOS (polycystic ovarian syndrome)   4. Obesity (BMI 30-39.9)   5. Malaise and fatigue   6. Vitamin D deficiency disease       Tests ordered Orders Placed This Encounter  Procedures   DHEA-sulfate   Estradiol   Progesterone   T3, free   TSH   COMPLETE METABOLIC PANEL WITH GFR   VITAMIN D 25 Hydroxy (Vit-D Deficiency, Fractures)      Plan:    Meds ordered  this encounter  Medications   Testosterone Cypionate 100 MG/ML SOLN    Sig: Inject 0.1 mLs as directed once a week.    Dispense:  10 mL    Refill:  2     Maleia Weems Luther Parody, MD

## 2021-03-02 NOTE — Progress Notes (Signed)
Chief Complaint: 60 year old lady comes in for an annual physical exam and to address her chronic conditions which are described below. HPI: She has a history of hypertension, PCOS, menopausal symptoms, obesity and vitamin D deficiency. She has been under a lot of stress and as a result gained weight recently.  Her husband was hospitalized for over a month because of sepsis. She continues on testosterone therapy once a week and feels reasonably well on it but perhaps things she could be better. She continues on estradiol and progesterone as before. She continues on Armour Thyroid as before. She continues on antihypertensive medications also with Benicar HCT as before.  Past Medical History:  Diagnosis Date   Anxiety state, unspecified    DEPRESSION    Hypertension    Obesity (BMI 35.0-39.9 without comorbidity) 05/16/2019   PCOS (polycystic ovarian syndrome) 05/16/2019   Sinus tachycardia 09/10/2015   Tear of MCL (medial collateral ligament) of knee 11/2012   left knee   Vitamin D deficiency disease 05/16/2019   Past Surgical History:  Procedure Laterality Date   COLONOSCOPY  2015   DILATATION & CURRETTAGE/HYSTEROSCOPY WITH RESECTOCOPE  08-11-2004   removal polyps   HAMMER TOE SURGERY Bilateral 03-22-2006   fifth toe   KNEE ARTHROSCOPY WITH MEDIAL COLLATERAL LIGAMENT RECONSTRUCTION Left 12/18/2012   Procedure: KNEE ARTHROSCOPY WITH OPEN MEDIAL COLLATERAL LIGAMENT RECONSTRUCTION WITH ALLOGRAFT,ANTERIOR CRUCIATE LIGAMENT RESCONSTRUCTION WITH ALLOGRAFT, PARTIAL MEDIALMENISECTOMY/DEBRIDEMENT CHONDROPLASTY;  Surgeon: Sydnee Cabal, MD;  Location: [place];  Service: Orthopedics;  Laterality: Left;     Social History   Social History Narrative   Married for 21 years,second.Lives with husband.Works at [employer].    Social History   Tobacco Use   Smoking status: Former    Packs/day: 0.25    Years: 6.00    Pack years: 1.50    Types: Cigarettes     Quit date: 09/13/1995    Years since quitting: 25.4   Smokeless tobacco: Never   Tobacco comments:    smoked socially on the weekends  Substance Use Topics   Alcohol use: Yes    Comment: occ      Allergies:  Allergies  Allergen Reactions   Oxycodone-Acetaminophen Itching    TYLOX ALSO     Current Meds  Medication Sig   ALPRAZolam (XANAX) 0.25 MG tablet TAKE 1 TABLET BY MOUTH ONCE DAILY AT BEDTIME AS NEEDED FOR ANXIETY   escitalopram (LEXAPRO) 5 MG tablet TAKE 1 TABLET BY MOUTH ONCE A DAY   estradiol (ESTRACE) 1 MG tablet TAKE 1 TABLET BY MOUTH ONCE A DAY   NONFORMULARY OR COMPOUNDED ITEM Inject 5 mg into the muscle every 7 (seven) days. Testosterone cypionate in olive  oil (25 mg/mL).  Dispense 5 mL vial.   olmesartan-hydrochlorothiazide (BENICAR HCT) 40-12.5 MG tablet TAKE 1/2 TABLET BY MOUTH DAILY *NEEDS OFFICE VISIT   progesterone (PROMETRIUM) 200 MG capsule Take 2 capsules (400 mg total) by mouth every evening.   Testosterone Cypionate 100 MG/ML SOLN Inject 0.1 mLs as directed once a week.   thyroid (ARMOUR) 120 MG tablet Take 1 tablet (120 mg total) by mouth daily before breakfast.       HQI:ONGEX from the symptoms mentioned above,there are no other symptoms referable to all systems reviewed.       Physical Exam:CMA Chaperone present Blood pressure 126/74, pulse (!) 117, temperature 97.9 F (36.6 C), temperature source Temporal, height 5' 5.5" (1.664 m), weight 199 lb 3.2 oz (90.4 kg), last menstrual period  06/18/2015, SpO2 98 %. Vitals with BMI 03/02/2021 10/21/2020 10/14/2020  Height 5' 5.5" 5\' 8"  5\' 8"   Weight 199 lbs 3 oz 197 lbs 2 oz 189 lbs  BMI 32.63 36.64 40.34  Systolic 742 595 638  Diastolic 74 86 70  Pulse 756 103 78      She looks systemically well.  Obese.  She has gained 10 pounds since last visit. General: Alert, cooperative, and appears to be the stated age.No pallor.  No jaundice.  No clubbing. Head: Normocephalic Eyes: Sclera white, pupils  equal and reactive to light, red reflex x 2,  Ears: Normal bilaterally Oral cavity: Lips, mucosa, and tongue normal: Teeth and gums normal Neck: No adenopathy, supple, symmetrical, trachea midline, and thyroid does not appear enlarged. Breast: No masses felt. Respiratory: Clear to auscultation bilaterally.No wheezing, crackles or bronchial breathing. Cardiovascular: Heart sounds are present and appear to be normal without murmurs or added sounds.  No carotid bruits.  Peripheral pulses are present and equal bilaterally.: Gastrointestinal:positive bowel sounds, no hepatosplenomegaly.  No masses felt.No tenderness. Skin: Clear, No rashes noted.No worrisome skin lesions seen. Neurological: Grossly intact without focal findings, cranial nerves II through XII intact, muscle strength equal bilaterally Musculoskeletal: No acute joint abnormalities noted.Full range of movement noted with joints. Psychiatric: Affect appropriate, non-anxious.    Assessment  1. Encounter for general adult medical examination with abnormal findings   2. Essential hypertension   3. Hot flashes due to menopause   4. PCOS (polycystic ovarian syndrome)   5. Obesity (BMI 30-39.9)   6. Malaise and fatigue   7. Vitamin D deficiency disease   8. Encounter for screening mammogram for malignant neoplasm of breast   9. Screening for diabetes mellitus     Tests Ordered:   Orders Placed This Encounter  Procedures   MM 3D SCREEN BREAST BILATERAL   DHEA-sulfate   Estradiol   Progesterone   T3, free   TSH   COMPLETE METABOLIC PANEL WITH GFR   VITAMIN D 25 Hydroxy (Vit-D Deficiency, Fractures)   CBC   Lipid panel   Hemoglobin A1c   EKG 12-Lead     Plan  60 year old lady with hypertension, obesity, PCOS. 2.  An ECG was done in the office which showed normal sinus rhythm without any acute ST-T wave changes. 3.  Blood work is ordered. 4.  I think we can further optimize testosterone therapy and she will start  taking testosterone 10 mg intramuscular once a week.  We can use the commercially available testosterone for this. 5.  Screening mammogram has been ordered. 6.  She will continue with estradiol and progesterone as present doses are and further recommendations will depend on blood levels. 7.  Continue with Armour Thyroid at the current dose and we will check thyroid function. 8.  Continue to focus on nutrition and exercise as before. 9.  I will see her in a couple of months time for follow-up and further recommendations will depend on blood results. 10.  Today, in addition to a preventative visit, I performed an office visit to address her conditions above.     Meds ordered this encounter  Medications   Testosterone Cypionate 100 MG/ML SOLN    Sig: Inject 0.1 mLs as directed once a week.    Dispense:  10 mL    Refill:  2     Sparrow Siracusa C Byrant Valent   03/02/2021, 2:55 PM

## 2021-03-03 ENCOUNTER — Other Ambulatory Visit (HOSPITAL_COMMUNITY): Payer: Self-pay

## 2021-03-03 LAB — CBC
HCT: 39.6 % (ref 35.0–45.0)
Hemoglobin: 13.2 g/dL (ref 11.7–15.5)
MCH: 29.3 pg (ref 27.0–33.0)
MCHC: 33.3 g/dL (ref 32.0–36.0)
MCV: 87.8 fL (ref 80.0–100.0)
MPV: 9.8 fL (ref 7.5–12.5)
Platelets: 274 10*3/uL (ref 140–400)
RBC: 4.51 10*6/uL (ref 3.80–5.10)
RDW: 12.7 % (ref 11.0–15.0)
WBC: 6 10*3/uL (ref 3.8–10.8)

## 2021-03-03 LAB — LIPID PANEL
Cholesterol: 220 mg/dL — ABNORMAL HIGH (ref <200)
HDL: 140 mg/dL (ref >=50)
LDL Cholesterol (Calc): 66 mg/dL (calc)
Non-HDL Cholesterol (Calc): 80 mg/dL (calc) (ref <130)
Total CHOL/HDL Ratio: 1.6 (calc) (ref <5.0)
Triglycerides: 55 mg/dL (ref <150)

## 2021-03-03 LAB — COMPLETE METABOLIC PANEL WITH GFR
AG Ratio: 1.4 (calc) (ref 1.0–2.5)
ALT: 14 U/L (ref 6–29)
AST: 15 U/L (ref 10–35)
Albumin: 4.4 g/dL (ref 3.6–5.1)
Alkaline phosphatase (APISO): 47 U/L (ref 37–153)
BUN: 12 mg/dL (ref 7–25)
CO2: 21 mmol/L (ref 20–32)
Calcium: 9.3 mg/dL (ref 8.6–10.4)
Chloride: 103 mmol/L (ref 98–110)
Creat: 0.72 mg/dL (ref 0.50–1.05)
GFR, Est African American: 106 mL/min/{1.73_m2} (ref >=60)
GFR, Est Non African American: 92 mL/min/{1.73_m2} (ref >=60)
Globulin: 3.1 g/dL (calc) (ref 1.9–3.7)
Glucose, Bld: 88 mg/dL (ref 65–139)
Potassium: 3.7 mmol/L (ref 3.5–5.3)
Sodium: 136 mmol/L (ref 135–146)
Total Bilirubin: 0.4 mg/dL (ref 0.2–1.2)
Total Protein: 7.5 g/dL (ref 6.1–8.1)

## 2021-03-03 LAB — ESTRADIOL: Estradiol: 113 pg/mL

## 2021-03-03 LAB — HEMOGLOBIN A1C
Hgb A1c MFr Bld: 5.2 % of total Hgb (ref <5.7)
Mean Plasma Glucose: 103 mg/dL
eAG (mmol/L): 5.7 mmol/L

## 2021-03-03 LAB — TSH: TSH: 0.13 mIU/L — ABNORMAL LOW (ref 0.40–4.50)

## 2021-03-03 LAB — T3, FREE: T3, Free: 3.5 pg/mL (ref 2.3–4.2)

## 2021-03-03 LAB — PROGESTERONE: Progesterone: 12 ng/mL

## 2021-03-03 LAB — VITAMIN D 25 HYDROXY (VIT D DEFICIENCY, FRACTURES): Vit D, 25-Hydroxy: 20 ng/mL — ABNORMAL LOW (ref 30–100)

## 2021-03-03 LAB — DHEA-SULFATE: DHEA-SO4: 191 ug/dL — ABNORMAL HIGH (ref 5–167)

## 2021-03-08 ENCOUNTER — Other Ambulatory Visit (HOSPITAL_COMMUNITY): Payer: Self-pay

## 2021-04-05 ENCOUNTER — Other Ambulatory Visit (HOSPITAL_COMMUNITY): Payer: Self-pay

## 2021-04-21 ENCOUNTER — Other Ambulatory Visit (INDEPENDENT_AMBULATORY_CARE_PROVIDER_SITE_OTHER): Payer: Self-pay | Admitting: Internal Medicine

## 2021-04-21 ENCOUNTER — Other Ambulatory Visit (HOSPITAL_COMMUNITY): Payer: Self-pay

## 2021-04-21 ENCOUNTER — Other Ambulatory Visit (INDEPENDENT_AMBULATORY_CARE_PROVIDER_SITE_OTHER): Payer: Self-pay | Admitting: Nurse Practitioner

## 2021-04-21 DIAGNOSIS — I10 Essential (primary) hypertension: Secondary | ICD-10-CM

## 2021-04-21 DIAGNOSIS — R5383 Other fatigue: Secondary | ICD-10-CM

## 2021-04-21 DIAGNOSIS — R5381 Other malaise: Secondary | ICD-10-CM

## 2021-04-21 MED ORDER — THYROID 30 MG PO TABS
ORAL_TABLET | ORAL | 0 refills | Status: DC
Start: 2021-04-21 — End: 2022-04-28
  Filled 2021-04-21: qty 7, fill #0
  Filled 2022-03-30: qty 7, 7d supply, fill #0

## 2021-04-21 MED ORDER — PROGESTERONE 200 MG PO CAPS
400.0000 mg | ORAL_CAPSULE | Freq: Every evening | ORAL | 2 refills | Status: DC
  Filled 2021-04-29: qty 60, 30d supply, fill #0
  Filled 2021-06-15: qty 60, 30d supply, fill #1
  Filled 2021-08-02: qty 60, 30d supply, fill #2

## 2021-04-21 MED ORDER — OLMESARTAN MEDOXOMIL-HCTZ 40-12.5 MG PO TABS
0.5000 | ORAL_TABLET | Freq: Every day | ORAL | 0 refills | Status: DC
  Filled 2021-04-21 – 2021-04-30 (×2): qty 45, 90d supply, fill #0
  Filled 2021-06-15: qty 15, 30d supply, fill #1

## 2021-04-21 MED ORDER — THYROID 90 MG PO TABS
ORAL_TABLET | ORAL | 0 refills | Status: DC
Start: 2021-04-21 — End: 2021-06-30
  Filled 2021-04-21: qty 7, fill #0
  Filled 2021-04-23: qty 7, 7d supply, fill #0

## 2021-04-21 MED ORDER — THYROID 60 MG PO TABS
ORAL_TABLET | ORAL | 0 refills | Status: DC
  Filled 2021-04-21: qty 7, fill #0
  Filled 2021-06-15: qty 7, 7d supply, fill #0

## 2021-04-21 MED ORDER — ESCITALOPRAM OXALATE 5 MG PO TABS
ORAL_TABLET | Freq: Every day | ORAL | 3 refills | Status: DC
  Filled 2021-04-29: qty 30, 30d supply, fill #0
  Filled 2021-06-15: qty 30, 30d supply, fill #1
  Filled 2021-07-16: qty 30, 30d supply, fill #2
  Filled 2021-08-16: qty 30, 30d supply, fill #3

## 2021-04-21 MED FILL — Estradiol Tab 1 MG: ORAL | 90 days supply | Qty: 90 | Fill #1 | Status: AC

## 2021-04-21 NOTE — Telephone Encounter (Signed)
Please call patient and let her know I did a chart review and I do not see that she has actual hypothyroidism.  I believe she is being treated for her symptoms with the off label use of NP thyroid.  For this reason I recommend she slowly come off of the medication.  I recommend that she change her dose to 90 mg of Armour Thyroid by mouth once a day for 1 week, then 60 mg of Armour thyroid by mouth daily for 1 week, then 30 mg of Armour Thyroid by mouth daily for 1 week and then stop the medication.  Please let me know if she has any other questions.  I have refilled her blood pressure medication.  Please provide her with other offices in the area that do practice bioidentical hormone replacement therapy so she can reach out to them for possible long-term treatment regarding hormones.  Thank you.

## 2021-04-23 ENCOUNTER — Other Ambulatory Visit (HOSPITAL_COMMUNITY): Payer: Self-pay

## 2021-04-26 ENCOUNTER — Other Ambulatory Visit: Payer: Self-pay | Admitting: *Deleted

## 2021-04-26 DIAGNOSIS — Z1231 Encounter for screening mammogram for malignant neoplasm of breast: Secondary | ICD-10-CM

## 2021-04-29 ENCOUNTER — Other Ambulatory Visit (HOSPITAL_COMMUNITY): Payer: Self-pay

## 2021-04-30 ENCOUNTER — Other Ambulatory Visit (INDEPENDENT_AMBULATORY_CARE_PROVIDER_SITE_OTHER): Payer: Self-pay | Admitting: Internal Medicine

## 2021-04-30 ENCOUNTER — Other Ambulatory Visit (HOSPITAL_COMMUNITY): Payer: Self-pay

## 2021-04-30 DIAGNOSIS — F411 Generalized anxiety disorder: Secondary | ICD-10-CM

## 2021-05-03 ENCOUNTER — Other Ambulatory Visit (HOSPITAL_COMMUNITY): Payer: Self-pay

## 2021-05-03 MED ORDER — ALPRAZOLAM 0.25 MG PO TABS
ORAL_TABLET | ORAL | 1 refills | Status: AC
  Filled 2021-05-03: qty 30, 30d supply, fill #0
  Filled 2021-06-15: qty 30, 30d supply, fill #1

## 2021-05-06 ENCOUNTER — Ambulatory Visit: Payer: 59

## 2021-05-06 ENCOUNTER — Other Ambulatory Visit (HOSPITAL_COMMUNITY): Payer: Self-pay

## 2021-05-06 ENCOUNTER — Ambulatory Visit (INDEPENDENT_AMBULATORY_CARE_PROVIDER_SITE_OTHER): Payer: 59 | Admitting: Internal Medicine

## 2021-05-11 NOTE — Telephone Encounter (Signed)
Called patient and gave her the message. Patient verbalized an understanding and I gave her a few different doctors offices in [place] and [place] in [place] and [place] in [place]. Patient thanked me for the information.

## 2021-05-23 IMAGING — DX DG CERVICAL SPINE COMPLETE 4+V
5 series · 5 of 5 positions shown · non-contrast
Comparison: 03/09/2017

CLINICAL DATA: Cervical neck pain. Right-sided pain for 2 weeks. No
known injury.

EXAM:
CERVICAL SPINE - COMPLETE 4+ VIEW

[c-spine lat]
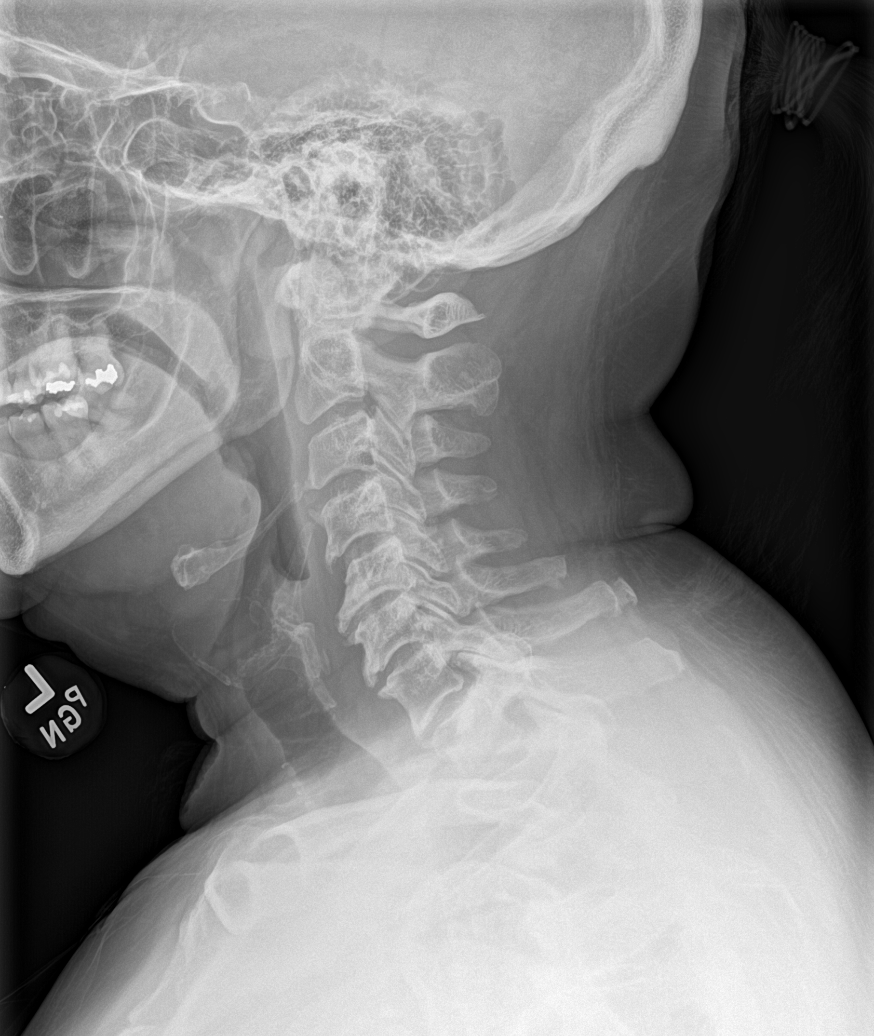

[c-spine obl (1 of 2)]
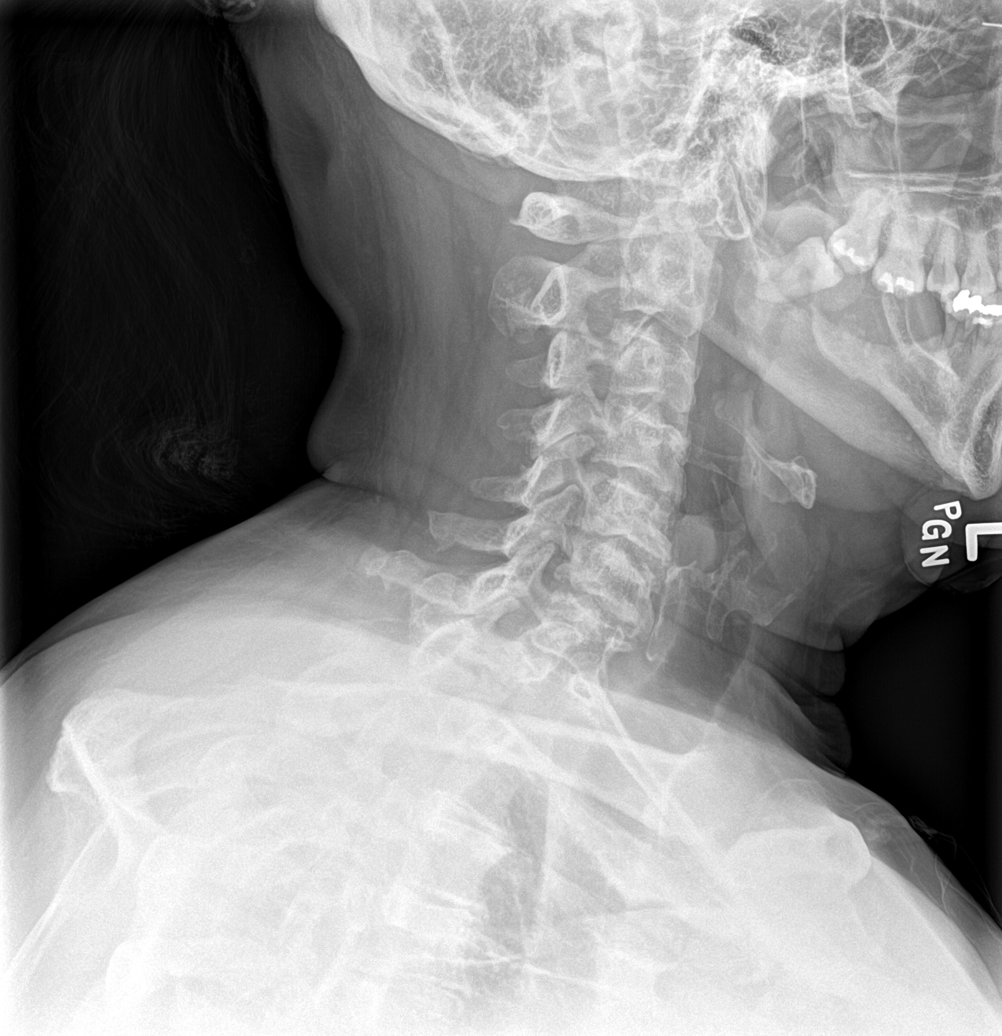

[c-spine obl (2 of 2)]
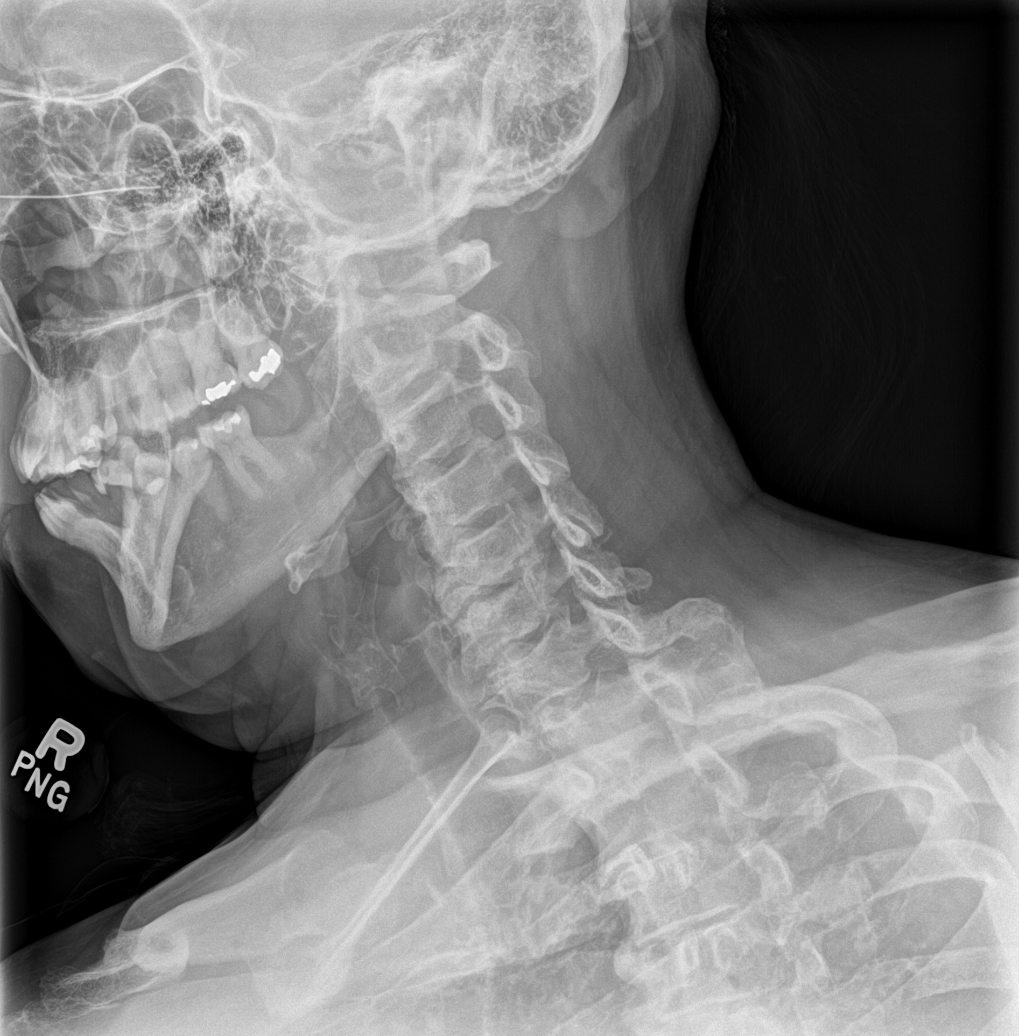

[c-spine ap]
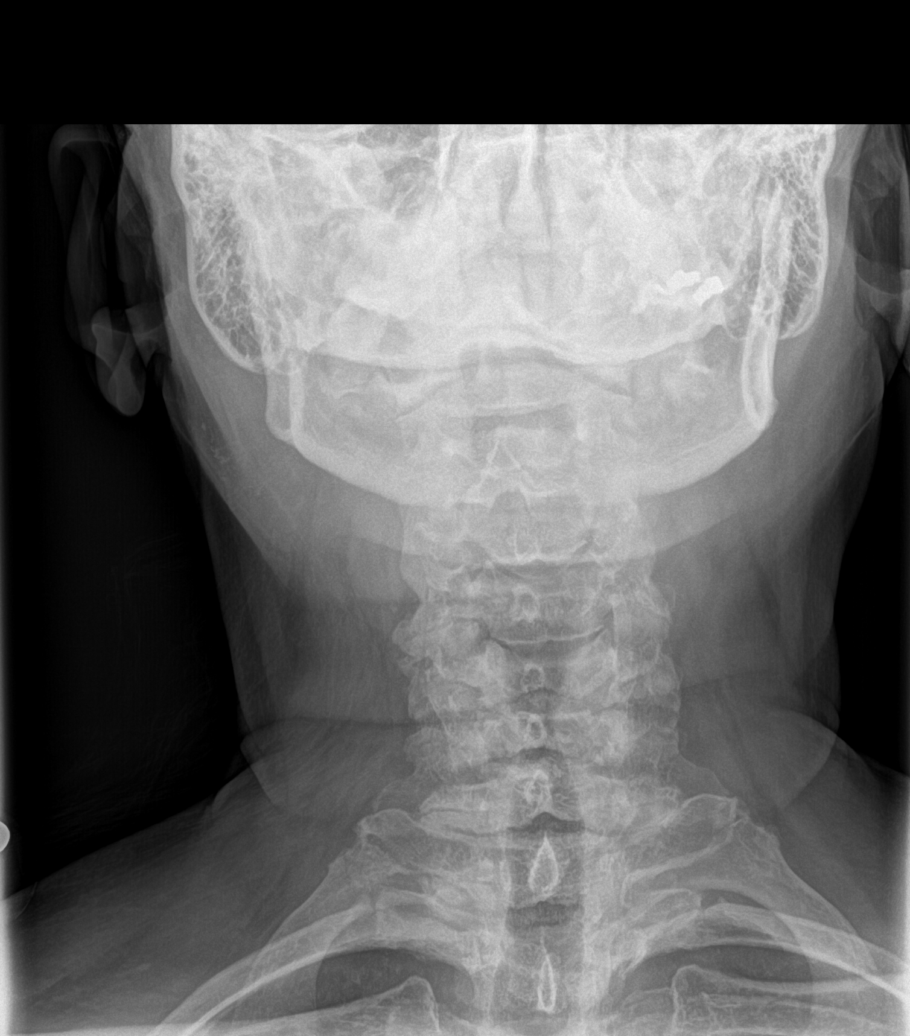

[c-spine open mouth]
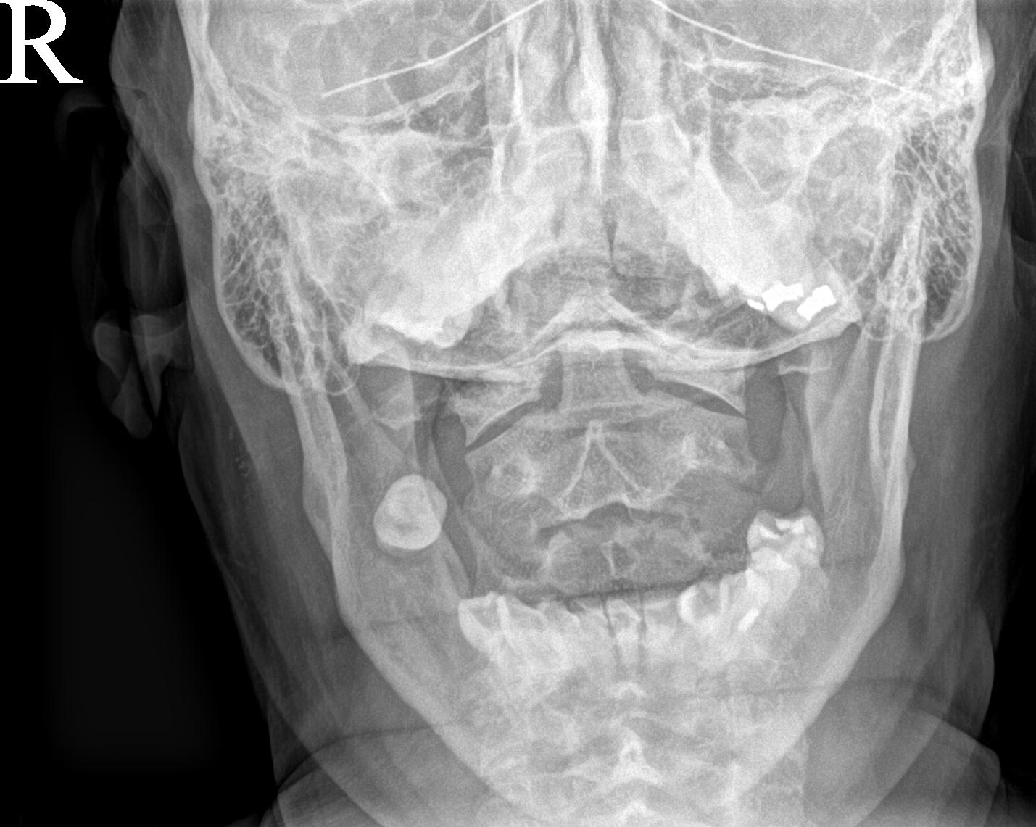

[5 of 5 positions shown; findings below may reference images not displayed]

FINDINGS: 3 mm retrolisthesis of C5 on C6, similar to prior exam. Trace
anterolisthesis of C7 on T1. Disc space narrowing and endplate
spurring at C4-C5, C5-C6, C6-C7 and C7-T1. Large anterior spurs at
C5-C6 and C6-C7. Multilevel facet hypertrophy. Bilateral neural
foraminal narrowing, more prominent on the right. No evidence of
fracture. The lateral masses of C1 are well aligned on C2. No
prevertebral soft tissue edema.
IMPRESSION: 1. Multilevel degenerative disc disease and facet hypertrophy.
Bilateral neural foraminal narrowing, more prominent on the right.
2. Unchanged 3 mm retrolisthesis of C5 on C6 and trace
anterolisthesis of C7 on T1 since 8163 exam.

## 2021-05-31 ENCOUNTER — Other Ambulatory Visit: Payer: Self-pay

## 2021-05-31 ENCOUNTER — Ambulatory Visit
Admission: RE | Admit: 2021-05-31 | Discharge: 2021-05-31 | Disposition: A | Discharge status: Home / Self Care | Payer: 59 | Source: Ambulatory Visit | Attending: *Deleted | Admitting: *Deleted

## 2021-05-31 DIAGNOSIS — Z1231 Encounter for screening mammogram for malignant neoplasm of breast: Secondary | ICD-10-CM

## 2021-06-01 DIAGNOSIS — E039 Hypothyroidism, unspecified: Secondary | ICD-10-CM | POA: Diagnosis not present

## 2021-06-01 DIAGNOSIS — N951 Menopausal and female climacteric states: Secondary | ICD-10-CM | POA: Diagnosis not present

## 2021-06-03 DIAGNOSIS — Z6833 Body mass index (BMI) 33.0-33.9, adult: Secondary | ICD-10-CM | POA: Diagnosis not present

## 2021-06-03 DIAGNOSIS — E039 Hypothyroidism, unspecified: Secondary | ICD-10-CM | POA: Diagnosis not present

## 2021-06-03 DIAGNOSIS — I10 Essential (primary) hypertension: Secondary | ICD-10-CM | POA: Diagnosis not present

## 2021-06-03 DIAGNOSIS — R232 Flushing: Secondary | ICD-10-CM | POA: Diagnosis not present

## 2021-06-03 DIAGNOSIS — N951 Menopausal and female climacteric states: Secondary | ICD-10-CM | POA: Diagnosis not present

## 2021-06-04 ENCOUNTER — Other Ambulatory Visit (HOSPITAL_COMMUNITY): Payer: Self-pay

## 2021-06-04 MED ORDER — ESTRADIOL 0.05 MG/24HR TD PTTW
MEDICATED_PATCH | TRANSDERMAL | 1 refills | Status: DC
  Filled 2021-06-04: qty 8, 28d supply, fill #0
  Filled 2022-03-30: qty 8, 28d supply, fill #1

## 2021-06-07 DIAGNOSIS — Z124 Encounter for screening for malignant neoplasm of cervix: Secondary | ICD-10-CM | POA: Diagnosis not present

## 2021-06-07 DIAGNOSIS — Z6833 Body mass index (BMI) 33.0-33.9, adult: Secondary | ICD-10-CM | POA: Diagnosis not present

## 2021-06-07 DIAGNOSIS — Z1289 Encounter for screening for malignant neoplasm of other sites: Secondary | ICD-10-CM | POA: Diagnosis not present

## 2021-06-07 DIAGNOSIS — Z1339 Encounter for screening examination for other mental health and behavioral disorders: Secondary | ICD-10-CM | POA: Diagnosis not present

## 2021-06-07 DIAGNOSIS — Z0000 Encounter for general adult medical examination without abnormal findings: Secondary | ICD-10-CM | POA: Diagnosis not present

## 2021-06-07 DIAGNOSIS — Z202 Contact with and (suspected) exposure to infections with a predominantly sexual mode of transmission: Secondary | ICD-10-CM | POA: Diagnosis not present

## 2021-06-07 DIAGNOSIS — Z1331 Encounter for screening for depression: Secondary | ICD-10-CM | POA: Diagnosis not present

## 2021-06-15 ENCOUNTER — Other Ambulatory Visit (INDEPENDENT_AMBULATORY_CARE_PROVIDER_SITE_OTHER): Payer: Self-pay | Admitting: Nurse Practitioner

## 2021-06-15 ENCOUNTER — Other Ambulatory Visit (HOSPITAL_COMMUNITY): Payer: Self-pay

## 2021-06-15 DIAGNOSIS — I10 Essential (primary) hypertension: Secondary | ICD-10-CM

## 2021-06-15 DIAGNOSIS — R5381 Other malaise: Secondary | ICD-10-CM

## 2021-06-15 DIAGNOSIS — R5383 Other fatigue: Secondary | ICD-10-CM

## 2021-06-29 DIAGNOSIS — N951 Menopausal and female climacteric states: Secondary | ICD-10-CM | POA: Diagnosis not present

## 2021-06-29 DIAGNOSIS — R232 Flushing: Secondary | ICD-10-CM | POA: Diagnosis not present

## 2021-06-29 DIAGNOSIS — Z6833 Body mass index (BMI) 33.0-33.9, adult: Secondary | ICD-10-CM | POA: Diagnosis not present

## 2021-06-30 ENCOUNTER — Other Ambulatory Visit (HOSPITAL_COMMUNITY): Payer: Self-pay

## 2021-06-30 MED ORDER — ARMOUR THYROID 90 MG PO TABS
ORAL_TABLET | ORAL | 2 refills | Status: DC
  Filled 2021-06-30: qty 30, 30d supply, fill #0

## 2021-07-01 ENCOUNTER — Other Ambulatory Visit (HOSPITAL_COMMUNITY): Payer: Self-pay

## 2021-07-05 ENCOUNTER — Other Ambulatory Visit (HOSPITAL_COMMUNITY): Payer: Self-pay

## 2021-07-06 ENCOUNTER — Other Ambulatory Visit (HOSPITAL_COMMUNITY): Payer: Self-pay

## 2021-07-09 ENCOUNTER — Other Ambulatory Visit (HOSPITAL_COMMUNITY): Payer: Self-pay

## 2021-07-13 ENCOUNTER — Other Ambulatory Visit (HOSPITAL_COMMUNITY): Payer: Self-pay

## 2021-07-14 ENCOUNTER — Other Ambulatory Visit (HOSPITAL_COMMUNITY): Payer: Self-pay

## 2021-07-16 ENCOUNTER — Other Ambulatory Visit (HOSPITAL_COMMUNITY): Payer: Self-pay

## 2021-07-20 ENCOUNTER — Other Ambulatory Visit (HOSPITAL_COMMUNITY): Payer: Self-pay

## 2021-07-23 ENCOUNTER — Other Ambulatory Visit (HOSPITAL_COMMUNITY): Payer: Self-pay

## 2021-07-24 ENCOUNTER — Other Ambulatory Visit (HOSPITAL_COMMUNITY): Payer: Self-pay

## 2021-07-26 ENCOUNTER — Other Ambulatory Visit (HOSPITAL_COMMUNITY): Payer: Self-pay

## 2021-07-28 DIAGNOSIS — E039 Hypothyroidism, unspecified: Secondary | ICD-10-CM | POA: Diagnosis not present

## 2021-07-28 DIAGNOSIS — N951 Menopausal and female climacteric states: Secondary | ICD-10-CM | POA: Diagnosis not present

## 2021-08-02 ENCOUNTER — Other Ambulatory Visit (HOSPITAL_COMMUNITY): Payer: Self-pay

## 2021-08-03 ENCOUNTER — Other Ambulatory Visit (HOSPITAL_COMMUNITY): Payer: Self-pay

## 2021-08-03 MED ORDER — NP THYROID 90 MG PO TABS
ORAL_TABLET | ORAL | 3 refills | Status: DC
  Filled 2021-08-03: qty 30, 30d supply, fill #0
  Filled 2021-09-03: qty 30, 30d supply, fill #1
  Filled 2021-10-06: qty 30, 30d supply, fill #2
  Filled 2021-11-09: qty 30, 30d supply, fill #3

## 2021-08-16 ENCOUNTER — Other Ambulatory Visit (HOSPITAL_COMMUNITY): Payer: Self-pay

## 2021-08-17 DIAGNOSIS — N951 Menopausal and female climacteric states: Secondary | ICD-10-CM | POA: Diagnosis not present

## 2021-08-17 DIAGNOSIS — Z6833 Body mass index (BMI) 33.0-33.9, adult: Secondary | ICD-10-CM | POA: Diagnosis not present

## 2021-08-17 DIAGNOSIS — R5383 Other fatigue: Secondary | ICD-10-CM | POA: Diagnosis not present

## 2021-08-17 DIAGNOSIS — M2550 Pain in unspecified joint: Secondary | ICD-10-CM | POA: Diagnosis not present

## 2021-08-18 ENCOUNTER — Other Ambulatory Visit: Payer: Self-pay | Admitting: Nurse Practitioner

## 2021-08-18 DIAGNOSIS — Z1231 Encounter for screening mammogram for malignant neoplasm of breast: Secondary | ICD-10-CM

## 2021-09-03 ENCOUNTER — Other Ambulatory Visit (INDEPENDENT_AMBULATORY_CARE_PROVIDER_SITE_OTHER): Payer: Self-pay | Admitting: Nurse Practitioner

## 2021-09-03 ENCOUNTER — Other Ambulatory Visit (HOSPITAL_COMMUNITY): Payer: Self-pay

## 2021-09-07 ENCOUNTER — Other Ambulatory Visit: Payer: Self-pay

## 2021-09-07 ENCOUNTER — Other Ambulatory Visit (HOSPITAL_COMMUNITY): Payer: Self-pay

## 2021-09-07 ENCOUNTER — Encounter: Payer: Self-pay | Admitting: Internal Medicine

## 2021-09-07 ENCOUNTER — Ambulatory Visit: Payer: 59 | Admitting: Internal Medicine

## 2021-09-07 DIAGNOSIS — E876 Hypokalemia: Secondary | ICD-10-CM

## 2021-09-07 DIAGNOSIS — I10 Essential (primary) hypertension: Secondary | ICD-10-CM

## 2021-09-07 DIAGNOSIS — E039 Hypothyroidism, unspecified: Secondary | ICD-10-CM | POA: Diagnosis not present

## 2021-09-07 DIAGNOSIS — N1831 Chronic kidney disease, stage 3a: Secondary | ICD-10-CM | POA: Insufficient documentation

## 2021-09-07 DIAGNOSIS — Z23 Encounter for immunization: Secondary | ICD-10-CM

## 2021-09-07 LAB — BASIC METABOLIC PANEL
BUN: 32 mg/dL — ABNORMAL HIGH (ref 6–23)
CO2: 20 mEq/L (ref 19–32)
Calcium: 9.5 mg/dL (ref 8.4–10.5)
Chloride: 103 mEq/L (ref 96–112)
Creatinine, Ser: 1.39 mg/dL — ABNORMAL HIGH (ref 0.40–1.20)
GFR: 41.36 mL/min — ABNORMAL LOW (ref >60.00)
Glucose, Bld: 90 mg/dL (ref 70–99)
Potassium: 3.6 mEq/L (ref 3.5–5.1)
Sodium: 135 mEq/L (ref 135–145)

## 2021-09-07 LAB — TSH: TSH: 0.47 u[IU]/mL (ref 0.35–5.50)

## 2021-09-07 LAB — MAGNESIUM: Magnesium: 2.3 mg/dL (ref 1.5–2.5)

## 2021-09-07 NOTE — Patient Instructions (Signed)

## 2021-09-07 NOTE — Progress Notes (Signed)
Subjective:  Patient ID: [name], female    DOB: 04-08-1961  Age: 60 y.o. MRN: [id number]  CC: Hypertension and Hypothyroidism  This visit occurred during the SARS-CoV-2 public health emergency.  Safety protocols were in place, including screening questions prior to the visit, additional usage of staff PPE, and extensive cleaning of exam room while observing appropriate contact time as indicated for disinfecting solutions.    HPI [name] presents for establishing.  She takes estrogen, testosterone, and a thyroid supplement that are prescribed by [facility].  She also has a history of hypertension.  Over the last few years she has lost weight with lifestyle modifications.  She is active and denies chest pain, shortness of breath, dizziness, lightheadedness, edema  History [name] has a past medical history of Anxiety state, unspecified, DEPRESSION, Hypertension, Obesity (BMI 35.0-39.9 without comorbidity) (05/16/2019), PCOS (polycystic ovarian syndrome) (05/16/2019), Sinus tachycardia (09/10/2015), Tear of MCL (medial collateral ligament) of knee (11/2012), and Vitamin D deficiency disease (05/16/2019).   She has a past surgical history that includes Dilatation & currettage/hysteroscopy with resectoscope (08-11-2004); Hammer toe surgery (Bilateral, 03-22-2006); Knee arthroscopy with medial collateral ligament reconstruction (Left, 12/18/2012); and Colonoscopy (2015).   Her family history includes Diabetes in her maternal grandmother; Hypertension in her father and mother; Kidney cancer in her mother.She reports that she quit smoking about 26 years ago. Her smoking use included cigarettes. She has a 1.50 pack-year smoking history. She has never used smokeless tobacco. She reports current alcohol use. She reports that she does not use drugs.  Outpatient Medications Prior to Visit  Medication Sig Dispense Refill   ALPRAZolam (XANAX) 0.25 MG tablet TAKE 1 TABLET BY MOUTH ONCE DAILY AT  BEDTIME AS NEEDED FOR ANXIETY 30 tablet 1   escitalopram (LEXAPRO) 5 MG tablet TAKE 1 TABLET BY MOUTH ONCE A DAY 30 tablet 3   estradiol (ESTRACE) 1 MG tablet TAKE 1 TABLET BY MOUTH ONCE A DAY 90 tablet 1   estradiol (VIVELLE-DOT) 0.05 MG/24HR patch Apply 1 patch to skin topically 2 times a week (discontinue oral estrogen tablets) 8 patch 1   NONFORMULARY OR COMPOUNDED ITEM Inject 5 mg into the muscle every 7 (seven) days. Testosterone cypionate in olive  oil (25 mg/mL).  Dispense 5 mL vial. 1 each 3   progesterone (PROMETRIUM) 200 MG capsule Take 2 capsules (400 mg total) by mouth every evening. 60 capsule 2   testosterone cypionate (DEPOTESTOTERONE CYPIONATE) 100 MG/ML injection INJECT 0.1 ML ONCE A WEEK AS DIRECTED. 10 mL 2   thyroid (ARMOUR THYROID) 30 MG tablet Taper down as directed.  Begin after taking 60mg  prescription.  Take 30 mg by mouth daily for 1 week, then stop the medication as directed 7 tablet 0   thyroid (ARMOUR THYROID) 60 MG tablet Take 90 mg by mouth daily for 1 week, then take 60 mg by mouth daily for 1 week, then take 30 mg a mouth daily for 1 week, then stop the medication. 7 tablet 0   thyroid (ARMOUR THYROID) 90 MG tablet Take 1 tablet by mouth on an empty stomach once daily 90 tablet 2   thyroid (NP THYROID) 90 MG tablet Take 1 tablet by mouth once a day on an empty stomach 30 tablet 3   apixaban (ELIQUIS) 5 MG TABS tablet TAKE 1 TABLET BY MOUTH 2 TIMES DAILY 60 tablet 2   olmesartan-hydrochlorothiazide (BENICAR HCT) 40-12.5 MG tablet Take 1/2 tablet by mouth daily. 45 tablet 0   No facility-administered  medications prior to visit.    ROS Review of Systems  Constitutional:  Negative for appetite change, diaphoresis, fatigue and unexpected weight change.  HENT: Negative.    Eyes: Negative.   Respiratory:  Negative for cough, chest tightness, shortness of breath and wheezing.   Cardiovascular:  Negative for chest pain, palpitations and leg swelling.   Gastrointestinal:  Negative for abdominal pain, constipation, diarrhea, nausea and vomiting.  Endocrine: Negative.  Negative for cold intolerance and heat intolerance.  Genitourinary: Negative.  Negative for difficulty urinating and dysuria.  Musculoskeletal: Negative.   Skin: Negative.   Neurological:  Negative for dizziness, weakness, light-headedness and headaches.  Hematological:  Negative for adenopathy. Does not bruise/bleed easily.  Psychiatric/Behavioral:  Positive for dysphoric mood. Negative for sleep disturbance. The patient is nervous/anxious.    Objective:  BP 114/78 (BP Location: Left Arm, Patient Position: Sitting, Cuff Size: Large)    Pulse 95    Temp 98.3 F (36.8 C) (Oral)    Resp 16    Ht 5' 5.5" (1.664 m)    Wt 198 lb (89.8 kg)    LMP 06/18/2015    SpO2 99%    BMI 32.45 kg/m   Physical Exam Vitals reviewed.  Constitutional:      Appearance: Normal appearance.  HENT:     Nose: Nose normal.     Mouth/Throat:     Mouth: Mucous membranes are moist.  Eyes:     General: No scleral icterus.    Conjunctiva/sclera: Conjunctivae normal.  Cardiovascular:     Rate and Rhythm: Normal rate and regular rhythm.     Heart sounds: No murmur heard. Pulmonary:     Effort: Pulmonary effort is normal.     Breath sounds: No stridor. No wheezing, rhonchi or rales.  Abdominal:     General: Abdomen is flat.     Palpations: There is no mass.     Tenderness: There is no abdominal tenderness. There is no guarding.     Hernia: No hernia is present.  Musculoskeletal:     Cervical back: Neck supple.  Lymphadenopathy:     Cervical: No cervical adenopathy.  Skin:    General: Skin is warm and dry.     Findings: No lesion.  Neurological:     General: No focal deficit present.     Mental Status: She is alert.  Psychiatric:        Mood and Affect: Mood normal.        Behavior: Behavior normal.    Lab Results  Component Value Date   WBC 6.0 03/02/2021   HGB 13.2 03/02/2021   HCT  39.6 03/02/2021   PLT 274 03/02/2021   GLUCOSE 90 09/07/2021   CHOL 220 (H) 03/02/2021   TRIG 55 03/02/2021   HDL 140 03/02/2021   LDLCALC 66 03/02/2021   ALT 14 03/02/2021   AST 15 03/02/2021   NA 135 09/07/2021   K 3.6 09/07/2021   CL 103 09/07/2021   CREATININE 1.39 (H) 09/07/2021   BUN 32 (H) 09/07/2021   CO2 20 09/07/2021   TSH 0.47 09/07/2021   INR 1.1 10/16/2019   HGBA1C 5.2 03/02/2021      Assessment & Plan:   [name] was seen today for hypertension and hypothyroidism.  Diagnoses and all orders for this visit:  Hypomagnesemia- Her magnesium is normal now. -     Magnesium; Future -     Magnesium  Essential hypertension- Her blood pressure is overcontrolled and she has prerenal azotemia.  I recommended that she stop taking the 2 antihypertensives. -     Basic metabolic panel; Future -     Magnesium; Future -     TSH; Future -     TSH -     Magnesium -     Basic metabolic panel  Hypokalemia- Her potassium level is normal now. -     Basic metabolic panel; Future -     Basic metabolic panel  Acquired hypothyroidism- Her TSH is in the normal range. -     TSH; Future -     TSH  Stage 3a chronic kidney disease ([place])- Her blood pressure is overcontrolled.  Will discontinue the antihypertensives.  Other orders -     Tdap vaccine greater than or equal to 7yo IM   I have discontinued Neshia G. Flamm's apixaban and olmesartan-hydrochlorothiazide. I am also having her maintain her NONFORMULARY OR COMPOUNDED ITEM, estradiol, testosterone cypionate, progesterone, escitalopram, thyroid, thyroid, ALPRAZolam, estradiol, Armour Thyroid, and NP Thyroid.  No orders of the defined types were placed in this encounter.    Follow-up: Return in about 6 months (around 03/08/2022).  Scarlette Calico, MD

## 2021-09-08 ENCOUNTER — Other Ambulatory Visit (HOSPITAL_COMMUNITY): Payer: Self-pay

## 2021-09-09 ENCOUNTER — Other Ambulatory Visit (HOSPITAL_COMMUNITY): Payer: Self-pay

## 2021-09-10 ENCOUNTER — Other Ambulatory Visit (HOSPITAL_COMMUNITY): Payer: Self-pay

## 2021-09-10 MED ORDER — PROGESTERONE 200 MG PO CAPS
ORAL_CAPSULE | ORAL | 1 refills | Status: DC
  Filled 2021-09-10: qty 90, 90d supply, fill #0
  Filled 2021-11-09 – 2021-12-03 (×2): qty 90, 90d supply, fill #1

## 2021-09-16 ENCOUNTER — Other Ambulatory Visit (HOSPITAL_COMMUNITY): Payer: Self-pay

## 2021-09-16 ENCOUNTER — Other Ambulatory Visit (INDEPENDENT_AMBULATORY_CARE_PROVIDER_SITE_OTHER): Payer: Self-pay | Admitting: Internal Medicine

## 2021-09-16 ENCOUNTER — Other Ambulatory Visit (INDEPENDENT_AMBULATORY_CARE_PROVIDER_SITE_OTHER): Payer: Self-pay | Admitting: Nurse Practitioner

## 2021-09-16 ENCOUNTER — Other Ambulatory Visit: Payer: Self-pay | Admitting: Internal Medicine

## 2021-09-16 DIAGNOSIS — I10 Essential (primary) hypertension: Secondary | ICD-10-CM

## 2021-09-16 DIAGNOSIS — F411 Generalized anxiety disorder: Secondary | ICD-10-CM

## 2021-09-16 MED ORDER — ESCITALOPRAM OXALATE 10 MG PO TABS
10.0000 mg | ORAL_TABLET | Freq: Every day | ORAL | 0 refills | Status: DC
  Filled 2021-09-16: qty 90, 90d supply, fill #0

## 2021-09-17 ENCOUNTER — Other Ambulatory Visit (HOSPITAL_COMMUNITY): Payer: Self-pay

## 2021-09-20 ENCOUNTER — Ambulatory Visit
Admission: RE | Admit: 2021-09-20 | Discharge: 2021-09-20 | Disposition: A | Discharge status: Home / Self Care | Payer: 59 | Source: Ambulatory Visit | Attending: Nurse Practitioner | Admitting: Nurse Practitioner

## 2021-09-20 ENCOUNTER — Other Ambulatory Visit: Payer: Self-pay

## 2021-09-20 DIAGNOSIS — Z1231 Encounter for screening mammogram for malignant neoplasm of breast: Secondary | ICD-10-CM

## 2021-09-21 ENCOUNTER — Telehealth: Payer: Self-pay

## 2021-09-21 ENCOUNTER — Other Ambulatory Visit: Payer: Self-pay | Admitting: Internal Medicine

## 2021-09-21 ENCOUNTER — Other Ambulatory Visit (HOSPITAL_COMMUNITY): Payer: Self-pay

## 2021-09-21 DIAGNOSIS — I10 Essential (primary) hypertension: Secondary | ICD-10-CM

## 2021-09-21 MED ORDER — AMLODIPINE BESYLATE 5 MG PO TABS
5.0000 mg | ORAL_TABLET | Freq: Every day | ORAL | 1 refills | Status: DC
Start: 2021-09-21 — End: 2021-09-30
  Filled 2021-09-21: qty 90, 90d supply, fill #0

## 2021-09-21 NOTE — Telephone Encounter (Signed)
Pt has been informed Rx was sent.

## 2021-09-21 NOTE — Telephone Encounter (Signed)
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 She also has concerns with being taken off of the medication due to family members passing away from strokes.   Please advise. [phone]

## 2021-09-30 ENCOUNTER — Other Ambulatory Visit (HOSPITAL_COMMUNITY): Payer: Self-pay

## 2021-09-30 ENCOUNTER — Other Ambulatory Visit (INDEPENDENT_AMBULATORY_CARE_PROVIDER_SITE_OTHER): Payer: Self-pay | Admitting: Internal Medicine

## 2021-09-30 ENCOUNTER — Other Ambulatory Visit: Payer: Self-pay | Admitting: Internal Medicine

## 2021-09-30 DIAGNOSIS — I10 Essential (primary) hypertension: Secondary | ICD-10-CM

## 2021-09-30 MED ORDER — NEBIVOLOL HCL 2.5 MG PO TABS
2.5000 mg | ORAL_TABLET | Freq: Every day | ORAL | 0 refills | Status: DC
Start: 2021-09-30 — End: 2021-12-29
  Filled 2021-09-30 – 2021-10-05 (×2): qty 90, 90d supply, fill #0

## 2021-09-30 NOTE — Telephone Encounter (Signed)
Patient calling in  Says she received call from pharmacy about new med provider prescribed nebivolol (BYSTOLIC) 2.5 MG tablet .. says they feel it was not a good idea for her to be taking medication  Wants to know if provider will put her back in her old BP medication olmesartan-hydrochlorothiazide (BENICAR HCT) 40-12.5 MG tablet bc it worked fine w/ no problems

## 2021-09-30 NOTE — Telephone Encounter (Signed)
Pt has been informed.

## 2021-09-30 NOTE — Telephone Encounter (Signed)
Pt is calling in requesting another script be called in for: olmesartan-hydrochlorothiazide (BENICAR HCT) 40-12.5 MG tablet The pt states that the new Rx: amLODipine (NORVASC) 5 MG tablet isnt working.  Pt states that she has started swelling in her legs and feet 09/27/21. Pt says that overall she just isn't feeling that well.  CB [phone]

## 2021-09-30 NOTE — Telephone Encounter (Signed)
Pt has been scheduled to discuss on Tues 1/24 @ 11.20am. She would like to know if you could switch Rx back prior to the OV.

## 2021-10-05 ENCOUNTER — Other Ambulatory Visit: Payer: Self-pay

## 2021-10-05 ENCOUNTER — Encounter: Payer: Self-pay | Admitting: Internal Medicine

## 2021-10-05 ENCOUNTER — Other Ambulatory Visit (HOSPITAL_COMMUNITY): Payer: Self-pay

## 2021-10-05 ENCOUNTER — Ambulatory Visit: Payer: 59 | Admitting: Internal Medicine

## 2021-10-05 VITALS — BP 140/92 | HR 94 | Temp 98.1°F | Ht 65.5 in | Wt 207.0 lb

## 2021-10-05 DIAGNOSIS — I10 Essential (primary) hypertension: Secondary | ICD-10-CM | POA: Diagnosis not present

## 2021-10-05 DIAGNOSIS — I739 Peripheral vascular disease, unspecified: Secondary | ICD-10-CM | POA: Insufficient documentation

## 2021-10-05 DIAGNOSIS — R600 Localized edema: Secondary | ICD-10-CM | POA: Insufficient documentation

## 2021-10-05 DIAGNOSIS — N1831 Chronic kidney disease, stage 3a: Secondary | ICD-10-CM | POA: Diagnosis not present

## 2021-10-05 DIAGNOSIS — R7989 Other specified abnormal findings of blood chemistry: Secondary | ICD-10-CM | POA: Insufficient documentation

## 2021-10-05 LAB — CBC WITH DIFFERENTIAL/PLATELET
Basophils Absolute: 0.1 10*3/uL (ref 0.0–0.1)
Basophils Relative: 0.9 % (ref 0.0–3.0)
Eosinophils Absolute: 0.1 10*3/uL (ref 0.0–0.7)
Eosinophils Relative: 2 % (ref 0.0–5.0)
HCT: 39.6 % (ref 36.0–46.0)
Hemoglobin: 12.4 g/dL (ref 12.0–15.0)
Lymphocytes Relative: 31.4 % (ref 12.0–46.0)
Lymphs Abs: 1.8 10*3/uL (ref 0.7–4.0)
MCHC: 31.3 g/dL (ref 30.0–36.0)
MCV: 92.9 fl (ref 78.0–100.0)
Monocytes Absolute: 0.7 10*3/uL (ref 0.1–1.0)
Monocytes Relative: 12.7 % — ABNORMAL HIGH (ref 3.0–12.0)
Neutro Abs: 3 10*3/uL (ref 1.4–7.7)
Neutrophils Relative %: 53 % (ref 43.0–77.0)
Platelets: 250 10*3/uL (ref 150.0–400.0)
RBC: 4.27 Mil/uL (ref 3.87–5.11)
RDW: 15.5 % (ref 11.5–15.5)
WBC: 5.7 10*3/uL (ref 4.0–10.5)

## 2021-10-05 LAB — BASIC METABOLIC PANEL
BUN: 11 mg/dL (ref 6–23)
CO2: 22 mEq/L (ref 19–32)
Calcium: 9 mg/dL (ref 8.4–10.5)
Chloride: 108 mEq/L (ref 96–112)
Creatinine, Ser: 0.91 mg/dL (ref 0.40–1.20)
GFR: 68.72 mL/min (ref >60.00)
Glucose, Bld: 88 mg/dL (ref 70–99)
Potassium: 3.9 mEq/L (ref 3.5–5.1)
Sodium: 138 mEq/L (ref 135–145)

## 2021-10-05 LAB — D-DIMER, QUANTITATIVE: D-Dimer, Quant: 1.15 mcg/mL FEU — ABNORMAL HIGH (ref <0.50)

## 2021-10-05 MED ORDER — INDAPAMIDE 1.25 MG PO TABS
1.2500 mg | ORAL_TABLET | Freq: Every day | ORAL | 0 refills | Status: DC
Start: 2021-10-05 — End: 2021-12-31
  Filled 2021-10-05: qty 90, 90d supply, fill #0

## 2021-10-05 MED ORDER — POTASSIUM CHLORIDE CRYS ER 15 MEQ PO TBCR
15.0000 meq | EXTENDED_RELEASE_TABLET | Freq: Two times a day (BID) | ORAL | 0 refills | Status: DC
  Filled 2021-10-05 – 2022-03-30 (×2): qty 180, 90d supply, fill #0

## 2021-10-05 NOTE — Patient Instructions (Signed)

## 2021-10-05 NOTE — Progress Notes (Signed)
Subjective:  Patient ID: [name], female    DOB: August 02, 1961  Age: 61 y.o. MRN: [id number]  CC: Hypertension  This visit occurred during the SARS-CoV-2 public health emergency.  Safety protocols were in place, including screening questions prior to the visit, additional usage of staff PPE, and extensive cleaning of exam room while observing appropriate contact time as indicated for disinfecting solutions.    HPI [name] presents for f/up -  She complains that her blood pressure is not adequately well controlled and she has developed lower extremity edema.  She is not taking nebivolol.  She also complains of lower extremity pain that she describes as claudication.   Outpatient Medications Prior to Visit  Medication Sig Dispense Refill   ALPRAZolam (XANAX) 0.25 MG tablet TAKE 1 TABLET BY MOUTH ONCE DAILY AT BEDTIME AS NEEDED FOR ANXIETY 30 tablet 1   escitalopram (LEXAPRO) 10 MG tablet Take 1 tablet (10 mg total) by mouth daily. 90 tablet 0   estradiol (ESTRACE) 1 MG tablet TAKE 1 TABLET BY MOUTH ONCE A DAY 90 tablet 1   estradiol (VIVELLE-DOT) 0.05 MG/24HR patch Apply 1 patch to skin topically 2 times a week (discontinue oral estrogen tablets) 8 patch 1   nebivolol (BYSTOLIC) 2.5 MG tablet Take 1 tablet (2.5 mg total) by mouth daily. 90 tablet 0   NONFORMULARY OR COMPOUNDED ITEM Inject 5 mg into the muscle every 7 (seven) days. Testosterone cypionate in olive  oil (25 mg/mL).  Dispense 5 mL vial. 1 each 3   progesterone (PROMETRIUM) 200 MG capsule Take 2 capsules (400 mg total) by mouth every evening. 60 capsule 2   progesterone (PROMETRIUM) 200 MG capsule Take 1 capsule by mouth once nightly as directed 90 capsule 1   testosterone cypionate (DEPOTESTOTERONE CYPIONATE) 100 MG/ML injection INJECT 0.1 ML ONCE A WEEK AS DIRECTED. 10 mL 2   thyroid (ARMOUR THYROID) 30 MG tablet Taper down as directed.  Begin after taking 60mg  prescription.  Take 30 mg by mouth daily for 1 week,  then stop the medication as directed 7 tablet 0   thyroid (ARMOUR THYROID) 60 MG tablet Take 90 mg by mouth daily for 1 week, then take 60 mg by mouth daily for 1 week, then take 30 mg a mouth daily for 1 week, then stop the medication. 7 tablet 0   thyroid (ARMOUR THYROID) 90 MG tablet Take 1 tablet by mouth on an empty stomach once daily 90 tablet 2   thyroid (NP THYROID) 90 MG tablet Take 1 tablet by mouth once a day on an empty stomach 30 tablet 3   No facility-administered medications prior to visit.    ROS Review of Systems  Constitutional:  Negative for chills, diaphoresis, fatigue and fever.  HENT: Negative.    Eyes: Negative.   Respiratory:  Negative for cough, choking, chest tightness, shortness of breath and stridor.   Cardiovascular:  Positive for leg swelling. Negative for chest pain and palpitations.  Gastrointestinal:  Negative for abdominal pain, constipation, diarrhea, nausea and vomiting.  Endocrine: Negative.   Genitourinary: Negative.   Musculoskeletal:  Positive for arthralgias. Negative for myalgias.  Skin: Negative.  Negative for color change.  Allergic/Immunologic: Negative.   Neurological: Negative.  Negative for dizziness, weakness and light-headedness.  Hematological:  Negative for adenopathy. Does not bruise/bleed easily.  Psychiatric/Behavioral: Negative.     Objective:  BP (!) 140/92 (BP Location: Left Arm, Patient Position: Sitting, Cuff Size: Large)    Pulse 94  Temp 98.1 F (36.7 C) (Oral)    Ht 5' 5.5" (1.664 m)    Wt 207 lb (93.9 kg)    LMP 06/18/2015    SpO2 98%    BMI 33.92 kg/m   BP Readings from Last 3 Encounters:  10/05/21 (!) 140/92  09/07/21 114/78  03/02/21 126/74    Wt Readings from Last 3 Encounters:  10/05/21 207 lb (93.9 kg)  09/07/21 198 lb (89.8 kg)  03/02/21 199 lb 3.2 oz (90.4 kg)    Physical Exam Vitals reviewed.  HENT:     Nose: Nose normal.     Mouth/Throat:     Mouth: Mucous membranes are moist.  Eyes:      General: No scleral icterus.    Conjunctiva/sclera: Conjunctivae normal.  Cardiovascular:     Rate and Rhythm: Normal rate and regular rhythm.     Pulses:          Dorsalis pedis pulses are 0 on the right side and 0 on the left side.       Posterior tibial pulses are 0 on the right side and 0 on the left side.     Heart sounds: No murmur heard.   No gallop.  Pulmonary:     Effort: Pulmonary effort is normal.     Breath sounds: No stridor. No wheezing, rhonchi or rales.  Abdominal:     Tenderness: There is no abdominal tenderness. There is no guarding or rebound.     Hernia: No hernia is present.  Musculoskeletal:     Cervical back: Neck supple.     Right lower leg: Pitting Edema (trace) present.     Left lower leg: 1+ Pitting Edema present.  Feet:     Right foot:     Toenail Condition: Right toenails are normal.     Left foot:     Toenail Condition: Left toenails are normal.  Lymphadenopathy:     Cervical: No cervical adenopathy.  Skin:    General: Skin is warm.     Findings: No lesion or rash.  Neurological:     General: No focal deficit present.     Mental Status: She is alert.  Psychiatric:        Mood and Affect: Mood normal.        Behavior: Behavior normal.    Lab Results  Component Value Date   WBC 5.7 10/05/2021   HGB 12.4 10/05/2021   HCT 39.6 10/05/2021   PLT 250.0 10/05/2021   GLUCOSE 88 10/05/2021   CHOL 220 (H) 03/02/2021   TRIG 55 03/02/2021   HDL 140 03/02/2021   LDLCALC 66 03/02/2021   ALT 14 03/02/2021   AST 15 03/02/2021   NA 138 10/05/2021   K 3.9 10/05/2021   CL 108 10/05/2021   CREATININE 0.91 10/05/2021   BUN 11 10/05/2021   CO2 22 10/05/2021   TSH 0.47 09/07/2021   INR 1.1 10/16/2019   HGBA1C 5.2 03/02/2021    MM 3D SCREEN BREAST BILATERAL  Result Date: 09/21/2021 CLINICAL DATA:  Screening. EXAM: DIGITAL SCREENING BILATERAL MAMMOGRAM WITH TOMOSYNTHESIS AND CAD TECHNIQUE: Bilateral screening digital craniocaudal and mediolateral  oblique mammograms were obtained. Bilateral screening digital breast tomosynthesis was performed. The images were evaluated with computer-aided detection. COMPARISON:  Previous exam(s). ACR Breast Density Category b: There are scattered areas of fibroglandular density. FINDINGS: There are no findings suspicious for malignancy. IMPRESSION: No mammographic evidence of malignancy. A result letter of this screening mammogram will be mailed directly  to the patient. RECOMMENDATION: Screening mammogram in one year. (Code:SM-B-01Y) BI-RADS CATEGORY  1: Negative. Electronically Signed   By: Franki Cabot M.D.   On: 09/21/2021 10:34   No results found.   Assessment & Plan:   [name] was seen today for hypertension.  Diagnoses and all orders for this visit:  Stage 3a chronic kidney disease ([place])- Her renal function has improved.  I recommended that she not restart the ACE inhibitor. -     Basic metabolic panel; Future -     CBC with Differential/Platelet; Future -     CBC with Differential/Platelet -     Basic metabolic panel  Essential hypertension- Her blood pressure is not adequately well controlled and she has lower extremity edema.  I encouraged her to start taking an nebivolol.  Will also start indapamide. -     Basic metabolic panel; Future -     indapamide (LOZOL) 1.25 MG tablet; Take 1 tablet (1.25 mg total) by mouth daily. -     potassium chloride SA (KLOR-CON M15) 15 MEQ tablet; Take 1 tablet (15 mEq total) by mouth 2 (two) times daily. -     CBC with Differential/Platelet; Future -     CBC with Differential/Platelet -     Basic metabolic panel  Lower leg edema- Ultrasound is negative for DVT. -     D-dimer, quantitative; Future -     indapamide (LOZOL) 1.25 MG tablet; Take 1 tablet (1.25 mg total) by mouth daily. -     D-dimer, quantitative -     VAS [place] LOWER EXTREMITY VENOUS (DVT); Future  Claudication of both lower extremities ([place])- Her ABIs are normal. -     VAS [place] ABI WITH/WO  TBI; Future  Elevated d-dimer -     VAS [place] LOWER EXTREMITY VENOUS (DVT); Future   I am having [name] start on indapamide and potassium chloride SA. I am also having her maintain her NONFORMULARY OR COMPOUNDED ITEM, estradiol, testosterone cypionate, progesterone, thyroid, thyroid, ALPRAZolam, estradiol, Armour Thyroid, NP Thyroid, progesterone, escitalopram, and nebivolol.  Meds ordered this encounter  Medications   indapamide (LOZOL) 1.25 MG tablet    Sig: Take 1 tablet (1.25 mg total) by mouth daily.    Dispense:  90 tablet    Refill:  0   potassium chloride SA (KLOR-CON M15) 15 MEQ tablet    Sig: Take 1 tablet (15 mEq total) by mouth 2 (two) times daily.    Dispense:  180 tablet    Refill:  0     Follow-up: Return in about 3 months (around 01/03/2022).  Regina Calico, MD

## 2021-10-06 ENCOUNTER — Other Ambulatory Visit (HOSPITAL_COMMUNITY): Payer: Self-pay

## 2021-10-07 ENCOUNTER — Other Ambulatory Visit (HOSPITAL_COMMUNITY): Payer: Self-pay

## 2021-10-07 MED ORDER — POTASSIUM CHLORIDE ER 10 MEQ PO TBCR
EXTENDED_RELEASE_TABLET | ORAL | 0 refills | Status: DC
  Filled 2021-10-07: qty 270, 90d supply, fill #0

## 2021-10-08 ENCOUNTER — Other Ambulatory Visit (HOSPITAL_COMMUNITY): Payer: Self-pay

## 2021-10-08 ENCOUNTER — Telehealth: Payer: Self-pay | Admitting: Internal Medicine

## 2021-10-08 ENCOUNTER — Ambulatory Visit (HOSPITAL_COMMUNITY)
Admission: RE | Admit: 2021-10-08 | Discharge: 2021-10-08 | Disposition: A | Discharge status: Home / Self Care | Payer: 59 | Source: Ambulatory Visit | Attending: Internal Medicine | Admitting: Internal Medicine

## 2021-10-08 ENCOUNTER — Ambulatory Visit (INDEPENDENT_AMBULATORY_CARE_PROVIDER_SITE_OTHER)
Admission: RE | Admit: 2021-10-08 | Discharge: 2021-10-08 | Disposition: A | Discharge status: Home / Self Care | Payer: 59 | Source: Ambulatory Visit | Attending: Internal Medicine | Admitting: Internal Medicine

## 2021-10-08 ENCOUNTER — Other Ambulatory Visit: Payer: Self-pay

## 2021-10-08 DIAGNOSIS — I739 Peripheral vascular disease, unspecified: Secondary | ICD-10-CM

## 2021-10-08 DIAGNOSIS — R600 Localized edema: Secondary | ICD-10-CM | POA: Diagnosis not present

## 2021-10-08 DIAGNOSIS — R7989 Other specified abnormal findings of blood chemistry: Secondary | ICD-10-CM

## 2021-10-08 NOTE — Telephone Encounter (Signed)
Matthew from [facility] called stating the patient is negative for dvt bi lateral.

## 2021-10-21 DIAGNOSIS — N951 Menopausal and female climacteric states: Secondary | ICD-10-CM | POA: Diagnosis not present

## 2021-10-21 DIAGNOSIS — E039 Hypothyroidism, unspecified: Secondary | ICD-10-CM | POA: Diagnosis not present

## 2021-10-27 ENCOUNTER — Other Ambulatory Visit (HOSPITAL_COMMUNITY): Payer: Self-pay

## 2021-10-28 ENCOUNTER — Other Ambulatory Visit (HOSPITAL_COMMUNITY): Payer: Self-pay

## 2021-10-28 DIAGNOSIS — Z6834 Body mass index (BMI) 34.0-34.9, adult: Secondary | ICD-10-CM | POA: Diagnosis not present

## 2021-10-28 DIAGNOSIS — R232 Flushing: Secondary | ICD-10-CM | POA: Diagnosis not present

## 2021-10-28 DIAGNOSIS — R5383 Other fatigue: Secondary | ICD-10-CM | POA: Diagnosis not present

## 2021-10-28 DIAGNOSIS — N951 Menopausal and female climacteric states: Secondary | ICD-10-CM | POA: Diagnosis not present

## 2021-10-28 MED ORDER — NP THYROID 90 MG PO TABS
ORAL_TABLET | ORAL | 1 refills | Status: DC
  Filled 2021-10-28 – 2021-12-17 (×2): qty 90, 90d supply, fill #0
  Filled 2022-03-22: qty 90, 90d supply, fill #1

## 2021-10-28 MED ORDER — PROGESTERONE 200 MG PO CAPS
ORAL_CAPSULE | ORAL | 1 refills | Status: DC
  Filled 2021-10-28: qty 90, 90d supply, fill #0

## 2021-10-30 ENCOUNTER — Other Ambulatory Visit (HOSPITAL_COMMUNITY): Payer: Self-pay

## 2021-11-08 ENCOUNTER — Other Ambulatory Visit (HOSPITAL_COMMUNITY): Payer: Self-pay

## 2021-11-09 ENCOUNTER — Other Ambulatory Visit (HOSPITAL_COMMUNITY): Payer: Self-pay

## 2021-11-12 ENCOUNTER — Other Ambulatory Visit (HOSPITAL_COMMUNITY): Payer: Self-pay

## 2021-11-22 ENCOUNTER — Other Ambulatory Visit (HOSPITAL_COMMUNITY): Payer: Self-pay

## 2021-12-01 ENCOUNTER — Other Ambulatory Visit (HOSPITAL_COMMUNITY): Payer: Self-pay

## 2021-12-03 ENCOUNTER — Other Ambulatory Visit (HOSPITAL_COMMUNITY): Payer: Self-pay

## 2021-12-15 ENCOUNTER — Other Ambulatory Visit (HOSPITAL_COMMUNITY): Payer: Self-pay

## 2021-12-15 ENCOUNTER — Other Ambulatory Visit (INDEPENDENT_AMBULATORY_CARE_PROVIDER_SITE_OTHER): Payer: Self-pay | Admitting: Nurse Practitioner

## 2021-12-15 DIAGNOSIS — F411 Generalized anxiety disorder: Secondary | ICD-10-CM

## 2021-12-17 ENCOUNTER — Other Ambulatory Visit (HOSPITAL_COMMUNITY): Payer: Self-pay

## 2021-12-17 MED ORDER — NP THYROID 90 MG PO TABS
ORAL_TABLET | ORAL | 3 refills | Status: DC
  Filled 2021-12-17: qty 30, 30d supply, fill #0

## 2021-12-21 ENCOUNTER — Other Ambulatory Visit (HOSPITAL_COMMUNITY): Payer: Self-pay

## 2021-12-29 ENCOUNTER — Other Ambulatory Visit (HOSPITAL_COMMUNITY): Payer: Self-pay

## 2021-12-29 ENCOUNTER — Other Ambulatory Visit: Payer: Self-pay | Admitting: Internal Medicine

## 2021-12-29 DIAGNOSIS — F411 Generalized anxiety disorder: Secondary | ICD-10-CM

## 2021-12-29 DIAGNOSIS — I10 Essential (primary) hypertension: Secondary | ICD-10-CM

## 2021-12-29 MED ORDER — ESCITALOPRAM OXALATE 10 MG PO TABS
10.0000 mg | ORAL_TABLET | Freq: Every day | ORAL | 0 refills | Status: DC
  Filled 2021-12-29: qty 90, 90d supply, fill #0

## 2021-12-29 MED ORDER — NEBIVOLOL HCL 2.5 MG PO TABS
2.5000 mg | ORAL_TABLET | Freq: Every day | ORAL | 0 refills | Status: DC
  Filled 2021-12-29: qty 90, 90d supply, fill #0

## 2021-12-30 ENCOUNTER — Other Ambulatory Visit (HOSPITAL_COMMUNITY): Payer: Self-pay

## 2021-12-31 ENCOUNTER — Other Ambulatory Visit: Payer: Self-pay | Admitting: Internal Medicine

## 2021-12-31 ENCOUNTER — Other Ambulatory Visit (HOSPITAL_COMMUNITY): Payer: Self-pay

## 2021-12-31 ENCOUNTER — Other Ambulatory Visit (INDEPENDENT_AMBULATORY_CARE_PROVIDER_SITE_OTHER): Payer: Self-pay | Admitting: Nurse Practitioner

## 2021-12-31 DIAGNOSIS — R600 Localized edema: Secondary | ICD-10-CM

## 2021-12-31 DIAGNOSIS — F411 Generalized anxiety disorder: Secondary | ICD-10-CM

## 2021-12-31 DIAGNOSIS — I10 Essential (primary) hypertension: Secondary | ICD-10-CM

## 2021-12-31 MED ORDER — INDAPAMIDE 1.25 MG PO TABS
1.2500 mg | ORAL_TABLET | Freq: Every day | ORAL | 0 refills | Status: DC
  Filled 2021-12-31: qty 90, 90d supply, fill #0

## 2022-01-01 ENCOUNTER — Other Ambulatory Visit (HOSPITAL_COMMUNITY): Payer: Self-pay

## 2022-01-05 ENCOUNTER — Other Ambulatory Visit (INDEPENDENT_AMBULATORY_CARE_PROVIDER_SITE_OTHER): Payer: Self-pay | Admitting: Nurse Practitioner

## 2022-01-05 ENCOUNTER — Other Ambulatory Visit (HOSPITAL_COMMUNITY): Payer: Self-pay

## 2022-01-05 DIAGNOSIS — F411 Generalized anxiety disorder: Secondary | ICD-10-CM

## 2022-01-13 ENCOUNTER — Other Ambulatory Visit (HOSPITAL_COMMUNITY): Payer: Self-pay

## 2022-01-19 ENCOUNTER — Other Ambulatory Visit (HOSPITAL_COMMUNITY): Payer: Self-pay

## 2022-01-22 ENCOUNTER — Other Ambulatory Visit (HOSPITAL_COMMUNITY): Payer: Self-pay

## 2022-01-24 ENCOUNTER — Other Ambulatory Visit (HOSPITAL_COMMUNITY): Payer: Self-pay

## 2022-01-25 LAB — HM PAP SMEAR

## 2022-01-27 DIAGNOSIS — R232 Flushing: Secondary | ICD-10-CM | POA: Diagnosis not present

## 2022-01-27 DIAGNOSIS — N951 Menopausal and female climacteric states: Secondary | ICD-10-CM | POA: Diagnosis not present

## 2022-01-27 DIAGNOSIS — Z6835 Body mass index (BMI) 35.0-35.9, adult: Secondary | ICD-10-CM | POA: Diagnosis not present

## 2022-01-27 DIAGNOSIS — E039 Hypothyroidism, unspecified: Secondary | ICD-10-CM | POA: Diagnosis not present

## 2022-01-27 DIAGNOSIS — R5383 Other fatigue: Secondary | ICD-10-CM | POA: Diagnosis not present

## 2022-03-22 ENCOUNTER — Other Ambulatory Visit (HOSPITAL_COMMUNITY): Payer: Self-pay

## 2022-03-30 ENCOUNTER — Other Ambulatory Visit (INDEPENDENT_AMBULATORY_CARE_PROVIDER_SITE_OTHER): Payer: Self-pay | Admitting: Nurse Practitioner

## 2022-03-30 ENCOUNTER — Other Ambulatory Visit: Payer: Self-pay | Admitting: Internal Medicine

## 2022-03-30 ENCOUNTER — Telehealth: Payer: Self-pay | Admitting: Internal Medicine

## 2022-03-30 ENCOUNTER — Other Ambulatory Visit (HOSPITAL_COMMUNITY): Payer: Self-pay

## 2022-03-30 DIAGNOSIS — R5381 Other malaise: Secondary | ICD-10-CM

## 2022-03-30 DIAGNOSIS — I10 Essential (primary) hypertension: Secondary | ICD-10-CM

## 2022-03-30 DIAGNOSIS — F411 Generalized anxiety disorder: Secondary | ICD-10-CM

## 2022-03-30 DIAGNOSIS — R600 Localized edema: Secondary | ICD-10-CM

## 2022-03-30 MED ORDER — INDAPAMIDE 1.25 MG PO TABS
1.2500 mg | ORAL_TABLET | Freq: Every day | ORAL | 0 refills | Status: DC
  Filled 2022-03-30: qty 90, 90d supply, fill #0

## 2022-03-30 MED ORDER — NEBIVOLOL HCL 2.5 MG PO TABS
2.5000 mg | ORAL_TABLET | Freq: Every day | ORAL | 0 refills | Status: DC
  Filled 2022-03-30: qty 90, 90d supply, fill #0

## 2022-03-30 NOTE — Telephone Encounter (Signed)
Caller & Relationship to patient: Synia Douglass  Call back number: [phone]  Date of last office visit: 10/05/21  Date of next office visit: 04/28/22  Medication(s) to be refilled:   indapamide (LOZOL) 1.25 MG tablet   Preferred Pharmacy:  [facility] Phone:  [phone]  Fax:  [phone]      Pt stated she is out of rx.

## 2022-03-31 ENCOUNTER — Other Ambulatory Visit (HOSPITAL_COMMUNITY): Payer: Self-pay

## 2022-04-04 ENCOUNTER — Other Ambulatory Visit (HOSPITAL_COMMUNITY): Payer: Self-pay

## 2022-04-05 ENCOUNTER — Other Ambulatory Visit (HOSPITAL_COMMUNITY): Payer: Self-pay

## 2022-04-06 ENCOUNTER — Other Ambulatory Visit (HOSPITAL_COMMUNITY): Payer: Self-pay

## 2022-04-07 ENCOUNTER — Other Ambulatory Visit (HOSPITAL_COMMUNITY): Payer: Self-pay

## 2022-04-07 MED ORDER — PROGESTERONE 200 MG PO CAPS
ORAL_CAPSULE | ORAL | 2 refills | Status: DC
Start: 2022-04-06 — End: 2023-11-20
  Filled 2022-04-07: qty 60, 30d supply, fill #0
  Filled 2022-10-05 – 2022-11-03 (×3): qty 60, 30d supply, fill #1

## 2022-04-28 ENCOUNTER — Encounter: Payer: Self-pay | Admitting: Internal Medicine

## 2022-04-28 ENCOUNTER — Ambulatory Visit: Payer: 59 | Admitting: Internal Medicine

## 2022-04-28 VITALS — BP 124/82 | HR 84 | Temp 98.5°F | Ht 65.0 in | Wt 211.2 lb

## 2022-04-28 DIAGNOSIS — Z23 Encounter for immunization: Secondary | ICD-10-CM | POA: Diagnosis not present

## 2022-04-28 DIAGNOSIS — N1831 Chronic kidney disease, stage 3a: Secondary | ICD-10-CM

## 2022-04-28 DIAGNOSIS — E876 Hypokalemia: Secondary | ICD-10-CM

## 2022-04-28 DIAGNOSIS — E039 Hypothyroidism, unspecified: Secondary | ICD-10-CM | POA: Diagnosis not present

## 2022-04-28 DIAGNOSIS — I10 Essential (primary) hypertension: Secondary | ICD-10-CM | POA: Diagnosis not present

## 2022-04-28 LAB — CBC WITH DIFFERENTIAL/PLATELET
Basophils Absolute: 0.1 10*3/uL (ref 0.0–0.1)
Basophils Relative: 1.2 % (ref 0.0–3.0)
Eosinophils Absolute: 0.1 10*3/uL (ref 0.0–0.7)
Eosinophils Relative: 1.7 % (ref 0.0–5.0)
HCT: 42.5 % (ref 36.0–46.0)
Hemoglobin: 13.8 g/dL (ref 12.0–15.0)
Lymphocytes Relative: 39.4 % (ref 12.0–46.0)
Lymphs Abs: 2.8 10*3/uL (ref 0.7–4.0)
MCHC: 32.4 g/dL (ref 30.0–36.0)
MCV: 88.2 fl (ref 78.0–100.0)
Monocytes Absolute: 0.7 10*3/uL (ref 0.1–1.0)
Monocytes Relative: 10.3 % (ref 3.0–12.0)
Neutro Abs: 3.3 10*3/uL (ref 1.4–7.7)
Neutrophils Relative %: 47.4 % (ref 43.0–77.0)
Platelets: 322 10*3/uL (ref 150.0–400.0)
RBC: 4.82 Mil/uL (ref 3.87–5.11)
RDW: 16 % — ABNORMAL HIGH (ref 11.5–15.5)
WBC: 7 10*3/uL (ref 4.0–10.5)

## 2022-04-28 LAB — BASIC METABOLIC PANEL
BUN: 11 mg/dL (ref 6–23)
CO2: 26 mEq/L (ref 19–32)
Calcium: 8.9 mg/dL (ref 8.4–10.5)
Chloride: 100 mEq/L (ref 96–112)
Creatinine, Ser: 0.77 mg/dL (ref 0.40–1.20)
GFR: 83.64 mL/min (ref >60.00)
Glucose, Bld: 97 mg/dL (ref 70–99)
Potassium: 3.2 mEq/L — ABNORMAL LOW (ref 3.5–5.1)
Sodium: 137 mEq/L (ref 135–145)

## 2022-04-28 LAB — TSH: TSH: 0.11 u[IU]/mL — ABNORMAL LOW (ref 0.35–5.50)

## 2022-04-28 MED ORDER — POTASSIUM CHLORIDE CRYS ER 15 MEQ PO TBCR
15.0000 meq | EXTENDED_RELEASE_TABLET | Freq: Three times a day (TID) | ORAL | 1 refills | Status: DC
  Filled 2022-04-28 – 2022-06-28 (×2): qty 270, 90d supply, fill #0
  Filled 2022-10-05: qty 270, 90d supply, fill #1

## 2022-04-28 MED ORDER — LEVOTHYROXINE SODIUM 100 MCG PO TABS
100.0000 ug | ORAL_TABLET | Freq: Every day | ORAL | 0 refills | Status: DC
  Filled 2022-04-28: qty 90, 90d supply, fill #0

## 2022-04-28 NOTE — Patient Instructions (Signed)

## 2022-04-28 NOTE — Progress Notes (Signed)
Subjective:  Patient ID: [name], female    DOB: 1961-08-13  Age: 61 y.o. MRN: [id number]  CC: Hypertension and Hypothyroidism   HPI [name] presents for f/up -  She is active and denies chest pain, shortness of breath, diaphoresis, or edema.  Outpatient Medications Prior to Visit  Medication Sig Dispense Refill   escitalopram (LEXAPRO) 10 MG tablet Take 1 tablet (10 mg total) by mouth daily. 90 tablet 0   estradiol (VIVELLE-DOT) 0.05 MG/24HR patch Apply 1 patch to skin topically 2 times a week (discontinue oral estrogen tablets) 8 patch 1   indapamide (LOZOL) 1.25 MG tablet Take 1 tablet (1.25 mg total) by mouth daily. 90 tablet 0   nebivolol (BYSTOLIC) 2.5 MG tablet Take 1 tablet (2.5 mg total) by mouth daily. 90 tablet 0   progesterone (PROMETRIUM) 200 MG capsule Take 1 - 2 capsules by mouth once nightly 60 capsule 2   testosterone cypionate (DEPOTESTOTERONE CYPIONATE) 100 MG/ML injection INJECT 0.1 ML ONCE A WEEK AS DIRECTED. 10 mL 2   NONFORMULARY OR COMPOUNDED ITEM Inject 5 mg into the muscle every 7 (seven) days. Testosterone cypionate in olive  oil (25 mg/mL).  Dispense 5 mL vial. 1 each 3   potassium chloride (KLOR-CON) 10 MEQ tablet Take 1 and 1/2 tablets by mouth twice daily. 270 tablet 0   potassium chloride SA (KLOR-CON M15) 15 MEQ tablet Take 1 tablet (15 mEq total) by mouth 2 (two) times daily. 180 tablet 0   progesterone (PROMETRIUM) 200 MG capsule Take 2 capsules (400 mg total) by mouth every evening. 60 capsule 2   progesterone (PROMETRIUM) 200 MG capsule Take 1 capsule by mouth once nightly as directed 90 capsule 1   progesterone (PROMETRIUM) 200 MG capsule Take 1 capsule by mouth once nightly 90 capsule 1   thyroid (ARMOUR THYROID) 30 MG tablet Taper down as directed.  Begin after taking '60mg'$  prescription.  Take 30 mg by mouth daily for 1 week, then stop the medication as directed 7 tablet 0   thyroid (ARMOUR THYROID) 60 MG tablet Take 90 mg by mouth  daily for 1 week, then take 60 mg by mouth daily for 1 week, then take 30 mg a mouth daily for 1 week, then stop the medication. 7 tablet 0   thyroid (ARMOUR THYROID) 90 MG tablet Take 1 tablet by mouth on an empty stomach once daily 90 tablet 2   thyroid (NP THYROID) 90 MG tablet Take 1 tablet by mouth once a day on an empty stomach 30 tablet 3   thyroid (NP THYROID) 90 MG tablet Take 1 tablet by mouth on an empty stomach once daily 90 tablet 1   thyroid (NP THYROID) 90 MG tablet Take 1 tablet by mouth once a day on an empty stomach 30 tablet 3   estradiol (ESTRACE) 1 MG tablet TAKE 1 TABLET BY MOUTH ONCE A DAY 90 tablet 1   No facility-administered medications prior to visit.    ROS Review of Systems  Constitutional:  Negative for diaphoresis and fatigue.  HENT: Negative.    Eyes: Negative.   Respiratory:  Negative for cough, chest tightness, shortness of breath and wheezing.   Cardiovascular:  Negative for chest pain, palpitations and leg swelling.  Gastrointestinal:  Negative for abdominal pain, constipation, diarrhea, nausea and vomiting.  Endocrine: Negative.   Genitourinary: Negative.  Negative for difficulty urinating.  Musculoskeletal: Negative.  Negative for arthralgias and myalgias.  Skin: Negative.   Neurological: Negative.  Negative for dizziness, weakness, light-headedness and headaches.  Hematological:  Negative for adenopathy. Does not bruise/bleed easily.  Psychiatric/Behavioral: Negative.      Objective:  BP 124/82 (BP Location: Right Arm, Patient Position: Sitting, Cuff Size: Large)   Pulse 84   Temp 98.5 F (36.9 C) (Oral)   Ht '5\' 5"'$  (1.651 m)   Wt 211 lb 4 oz (95.8 kg)   LMP 06/18/2015   BMI 35.15 kg/m   BP Readings from Last 3 Encounters:  04/28/22 124/82  10/05/21 (!) 140/92  09/07/21 114/78    Wt Readings from Last 3 Encounters:  04/28/22 211 lb 4 oz (95.8 kg)  10/05/21 207 lb (93.9 kg)  09/07/21 198 lb (89.8 kg)    Physical Exam Vitals  reviewed.  HENT:     Nose: Nose normal.     Mouth/Throat:     Mouth: Mucous membranes are moist.  Eyes:     General: No scleral icterus.    Conjunctiva/sclera: Conjunctivae normal.  Cardiovascular:     Rate and Rhythm: Normal rate and regular rhythm.     Heart sounds: No murmur heard. Pulmonary:     Effort: Pulmonary effort is normal.     Breath sounds: No stridor. No wheezing, rhonchi or rales.  Abdominal:     General: Abdomen is flat.     Palpations: There is no mass.     Tenderness: There is no abdominal tenderness. There is no guarding.     Hernia: No hernia is present.  Musculoskeletal:        General: Normal range of motion.     Cervical back: Neck supple.     Right lower leg: No edema.     Left lower leg: No edema.  Lymphadenopathy:     Cervical: No cervical adenopathy.  Skin:    General: Skin is warm and dry.  Neurological:     General: No focal deficit present.     Mental Status: She is alert.  Psychiatric:        Mood and Affect: Mood normal.        Behavior: Behavior normal.     Lab Results  Component Value Date   WBC 7.0 04/28/2022   HGB 13.8 04/28/2022   HCT 42.5 04/28/2022   PLT 322.0 04/28/2022   GLUCOSE 97 04/28/2022   CHOL 220 (H) 03/02/2021   TRIG 55 03/02/2021   HDL 140 03/02/2021   LDLCALC 66 03/02/2021   ALT 14 03/02/2021   AST 15 03/02/2021   NA 137 04/28/2022   K 3.2 (L) 04/28/2022   CL 100 04/28/2022   CREATININE 0.77 04/28/2022   BUN 11 04/28/2022   CO2 26 04/28/2022   TSH 0.11 (L) 04/28/2022   INR 1.1 10/16/2019   HGBA1C 5.2 03/02/2021    VAS [place] ABI WITH/WO TBI  Result Date: 10/08/2021  LOWER EXTREMITY DOPPLER STUDY Patient Name:  [name]  Date of Exam:   10/08/2021 Medical Rec #: [id number]         Accession #:    [id number] Date of Birth: Nov 07, 1960        Patient Gender: F Patient Age:   85 years Exam Location:  [facility] Procedure:      VAS [place] ABI WITH/WO TBI Referring Phys: Scarlette Calico  --------------------------------------------------------------------------------  Indications: Claudication. High Risk Factors: Hypertension, past history of smoking.  Performing Technologist: Delorise Shiner RVT  Examination Guidelines: A complete evaluation includes at minimum, Doppler waveform signals and systolic blood  pressure reading at the level of bilateral brachial, anterior tibial, and posterior tibial arteries, when vessel segments are accessible. Bilateral testing is considered an integral part of a complete examination. Photoelectric Plethysmograph (PPG) waveforms and toe systolic pressure readings are included as required and additional duplex testing as needed. Limited examinations for reoccurring indications may be performed as noted.  ABI Findings: +---------+------------------+-----+---------+--------+ Right    Rt Pressure (mmHg)IndexWaveform Comment  +---------+------------------+-----+---------+--------+ Brachial 140                                      +---------+------------------+-----+---------+--------+ PTA      168               1.20 triphasic         +---------+------------------+-----+---------+--------+ DP       159               1.14 triphasic         +---------+------------------+-----+---------+--------+ Great Toe132               0.94                   +---------+------------------+-----+---------+--------+ +---------+------------------+-----+---------+-------+ Left     Lt Pressure (mmHg)IndexWaveform Comment +---------+------------------+-----+---------+-------+ Brachial 134                                     +---------+------------------+-----+---------+-------+ PTA      148               1.06 triphasic        +---------+------------------+-----+---------+-------+ DP       133               0.95 triphasic        +---------+------------------+-----+---------+-------+ Great Toe144               1.03                   +---------+------------------+-----+---------+-------+ +-------+-----------+-----------+------------+------------+ ABI/TBIToday's ABIToday's TBIPrevious ABIPrevious TBI +-------+-----------+-----------+------------+------------+ Right  1.20       0.94                                +-------+-----------+-----------+------------+------------+ Left   1.06       1.03                                +-------+-----------+-----------+------------+------------+  Findings reported to [facility] at 15:44. Summary: Right: Resting right ankle-brachial index is within normal range. No evidence of significant right lower extremity arterial disease. The right toe-brachial index is normal. Left: Resting left ankle-brachial index is within normal range. No evidence of significant left lower extremity arterial disease. The left toe-brachial index is normal.  *See table(s) above for measurements and observations.  Electronically signed by Orlie Pollen on 10/08/2021 at 5:05:11 PM.    Final    VAS [place] LOWER EXTREMITY VENOUS (DVT)  Result Date: 10/08/2021  Lower Venous DVT Study Patient Name:  [name]  Date of Exam:   10/08/2021 Medical Rec #: [id number]         Accession #:    [id number] Date of Birth: 10/26/1960        Patient Gender: F Patient Age:  60 years Exam Location:  [facility] Procedure:      VAS [place] LOWER EXTREMITY VENOUS (DVT) Referring Phys: Scarlette Calico --------------------------------------------------------------------------------  Indications: Swelling, and Pain.  Comparison Study: Prior bilateral venous duplex 01/10/2020 was negative for DVT                   bilaterally. Performing Technologist: Delorise Shiner RVT  Examination Guidelines: A complete evaluation includes B-mode imaging, spectral Doppler, color Doppler, and power Doppler as needed of all accessible portions of each vessel. Bilateral testing is considered an integral part of a complete examination. Limited  examinations for reoccurring indications may be performed as noted. The reflux portion of the exam is performed with the patient in reverse Trendelenburg.  +---------+---------------+---------+-----------+----------+--------------+ RIGHT    CompressibilityPhasicitySpontaneityPropertiesThrombus Aging +---------+---------------+---------+-----------+----------+--------------+ CFV      Full           Yes      Yes                                 +---------+---------------+---------+-----------+----------+--------------+ SFJ      Full           Yes      Yes                                 +---------+---------------+---------+-----------+----------+--------------+ FV Prox  Full           Yes      Yes                                 +---------+---------------+---------+-----------+----------+--------------+ FV Mid   Full           Yes      Yes                                 +---------+---------------+---------+-----------+----------+--------------+ FV DistalFull           Yes      Yes                                 +---------+---------------+---------+-----------+----------+--------------+ POP      Full           Yes      Yes                                 +---------+---------------+---------+-----------+----------+--------------+ GSV      Full                    Yes                                 +---------+---------------+---------+-----------+----------+--------------+ SSV      Full                                                        +---------+---------------+---------+-----------+----------+--------------+   +---------+---------------+---------+-----------+----------+--------------+ LEFT  CompressibilityPhasicitySpontaneityPropertiesThrombus Aging +---------+---------------+---------+-----------+----------+--------------+ CFV      Full           No       Yes                                  +---------+---------------+---------+-----------+----------+--------------+ SFJ      Full           No       Yes                                 +---------+---------------+---------+-----------+----------+--------------+ FV Prox  Full           No       Yes                                 +---------+---------------+---------+-----------+----------+--------------+ FV Mid   Full           No       Yes                                 +---------+---------------+---------+-----------+----------+--------------+ FV DistalFull           No       Yes                                 +---------+---------------+---------+-----------+----------+--------------+ POP      Full           Yes      Yes                                 +---------+---------------+---------+-----------+----------+--------------+ PTV      Full                                                        +---------+---------------+---------+-----------+----------+--------------+ GSV      Full                                                        +---------+---------------+---------+-----------+----------+--------------+ SSV      Full                                                        +---------+---------------+---------+-----------+----------+--------------+    Findings reported to [facility] at 15:44.  Summary: RIGHT: - No evidence of deep vein thrombosis in the lower extremity. No indirect evidence of obstruction proximal to the inguinal ligament. - There is no evidence of deep vein thrombosis in the lower extremity. - There is no evidence of superficial venous thrombosis.  - No cystic structure found in the popliteal fossa.  LEFT: - No evidence  of deep vein thrombosis in the lower extremity. No indirect evidence of obstruction proximal to the inguinal ligament. - There is no evidence of deep vein thrombosis in the lower extremity. - There is no evidence of superficial venous thrombosis.  - No cystic  structure found in the popliteal fossa.  *See table(s) above for measurements and observations. Electronically signed by Orlie Pollen on 10/08/2021 at 5:05:02 PM.    Final     Assessment & Plan:   [name] was seen today for hypertension and hypothyroidism.  Diagnoses and all orders for this visit:  Acquired hypothyroidism- Her TSH is suppressed.  Will lower her T4 dosage. -     TSH; Future -     TSH -     levothyroxine (SYNTHROID) 100 MCG tablet; Take 1 tablet (100 mcg total) by mouth daily.  Essential hypertension- Her blood pressure is adequately well controlled. -     Basic metabolic panel; Future -     CBC with Differential/Platelet; Future -     CBC with Differential/Platelet -     Basic metabolic panel -     potassium chloride SA (KLOR-CON M15) 15 MEQ tablet; Take 1 tablet (15 mEq total) by mouth 3 (three) times daily.  Stage 3a chronic kidney disease (Rich)- Her renal function is normal now. -     Basic metabolic panel; Future -     CBC with Differential/Platelet; Future -     CBC with Differential/Platelet -     Basic metabolic panel  Need for vaccination -     Zoster Recombinant (Shingrix )  Hypokalemia -     potassium chloride SA (KLOR-CON M15) 15 MEQ tablet; Take 1 tablet (15 mEq total) by mouth 3 (three) times daily.   I have discontinued [name]'s NONFORMULARY OR COMPOUNDED ITEM, thyroid, thyroid, Armour Thyroid, NP Thyroid, potassium chloride, NP Thyroid, and NP Thyroid. I have also changed her potassium chloride SA. Additionally, I am having her start on levothyroxine. Lastly, I am having her maintain her estradiol, testosterone cypionate, estradiol, escitalopram, indapamide, nebivolol, and progesterone.  Meds ordered this encounter  Medications   potassium chloride SA (KLOR-CON M15) 15 MEQ tablet    Sig: Take 1 tablet (15 mEq total) by mouth 3 (three) times daily.    Dispense:  270 tablet    Refill:  1   levothyroxine (SYNTHROID) 100 MCG tablet     Sig: Take 1 tablet (100 mcg total) by mouth daily.    Dispense:  90 tablet    Refill:  0     Follow-up: Return in about 6 months (around 10/29/2022).  Scarlette Calico, MD

## 2022-04-29 ENCOUNTER — Other Ambulatory Visit (HOSPITAL_COMMUNITY): Payer: Self-pay

## 2022-05-03 DIAGNOSIS — H1131 Conjunctival hemorrhage, right eye: Secondary | ICD-10-CM | POA: Diagnosis not present

## 2022-05-05 ENCOUNTER — Telehealth: Payer: Self-pay | Admitting: Internal Medicine

## 2022-05-05 NOTE — Telephone Encounter (Signed)
Pt has been informed and expressed understanding.  

## 2022-05-05 NOTE — Telephone Encounter (Signed)
Patient wants to know if she is to stop taking her Np thyroid and start taking the new medication only.  Please advise if she is to keep taking both.

## 2022-05-11 ENCOUNTER — Other Ambulatory Visit (HOSPITAL_COMMUNITY): Payer: Self-pay

## 2022-05-11 ENCOUNTER — Other Ambulatory Visit: Payer: Self-pay | Admitting: Internal Medicine

## 2022-05-11 MED ORDER — ESTRADIOL 0.05 MG/24HR TD PTTW
MEDICATED_PATCH | TRANSDERMAL | 1 refills | Status: DC
  Filled 2022-05-11: qty 8, 28d supply, fill #0
  Filled 2022-06-24: qty 8, 28d supply, fill #1

## 2022-05-13 ENCOUNTER — Other Ambulatory Visit (HOSPITAL_COMMUNITY): Payer: Self-pay

## 2022-05-13 MED ORDER — PROGESTERONE 200 MG PO CAPS
ORAL_CAPSULE | ORAL | 1 refills | Status: DC
  Filled 2022-05-13: qty 10, 10d supply, fill #0
  Filled 2022-05-13: qty 80, 80d supply, fill #0
  Filled 2022-10-05: qty 90, 90d supply, fill #1

## 2022-06-23 ENCOUNTER — Telehealth: Payer: Self-pay | Admitting: Internal Medicine

## 2022-06-23 NOTE — Telephone Encounter (Signed)
Patient would like to know if Dr. Ronnald Ramp can schedule her labs for her thyroid - since he changed her meds in August she has been feeling extremely lethargic with no energy.  Please let patient know.

## 2022-06-24 ENCOUNTER — Other Ambulatory Visit (HOSPITAL_COMMUNITY): Payer: Self-pay

## 2022-06-24 ENCOUNTER — Other Ambulatory Visit: Payer: Self-pay | Admitting: Internal Medicine

## 2022-06-24 DIAGNOSIS — N1831 Chronic kidney disease, stage 3a: Secondary | ICD-10-CM

## 2022-06-24 DIAGNOSIS — I10 Essential (primary) hypertension: Secondary | ICD-10-CM

## 2022-06-24 DIAGNOSIS — E039 Hypothyroidism, unspecified: Secondary | ICD-10-CM

## 2022-06-24 NOTE — Telephone Encounter (Signed)
Pt informed labs were entered.

## 2022-06-24 NOTE — Progress Notes (Unsigned)
Lab Results  Component Value Date   WBC 7.0 04/28/2022   HGB 13.8 04/28/2022   HCT 42.5 04/28/2022   PLT 322.0 04/28/2022   GLUCOSE 97 04/28/2022   CHOL 220 (H) 03/02/2021   TRIG 55 03/02/2021   HDL 140 03/02/2021   LDLCALC 66 03/02/2021   ALT 14 03/02/2021   AST 15 03/02/2021   NA 137 04/28/2022   K 3.2 (L) 04/28/2022   CL 100 04/28/2022   CREATININE 0.77 04/28/2022   BUN 11 04/28/2022   CO2 26 04/28/2022   TSH 0.11 (L) 04/28/2022   INR 1.1 10/16/2019   HGBA1C 5.2 03/02/2021

## 2022-06-27 ENCOUNTER — Other Ambulatory Visit (INDEPENDENT_AMBULATORY_CARE_PROVIDER_SITE_OTHER): Payer: 59

## 2022-06-27 DIAGNOSIS — E039 Hypothyroidism, unspecified: Secondary | ICD-10-CM | POA: Diagnosis not present

## 2022-06-27 DIAGNOSIS — N1831 Chronic kidney disease, stage 3a: Secondary | ICD-10-CM | POA: Diagnosis not present

## 2022-06-27 DIAGNOSIS — I10 Essential (primary) hypertension: Secondary | ICD-10-CM

## 2022-06-27 LAB — BASIC METABOLIC PANEL
BUN: 13 mg/dL (ref 6–23)
CO2: 26 mEq/L (ref 19–32)
Calcium: 8.6 mg/dL (ref 8.4–10.5)
Chloride: 102 mEq/L (ref 96–112)
Creatinine, Ser: 0.81 mg/dL (ref 0.40–1.20)
GFR: 78.62 mL/min (ref >60.00)
Glucose, Bld: 114 mg/dL — ABNORMAL HIGH (ref 70–99)
Potassium: 3.5 mEq/L (ref 3.5–5.1)
Sodium: 137 mEq/L (ref 135–145)

## 2022-06-27 LAB — CBC WITH DIFFERENTIAL/PLATELET
Basophils Absolute: 0 10*3/uL (ref 0.0–0.1)
Basophils Relative: 0.6 % (ref 0.0–3.0)
Eosinophils Absolute: 0.2 10*3/uL (ref 0.0–0.7)
Eosinophils Relative: 2.6 % (ref 0.0–5.0)
HCT: 41.5 % (ref 36.0–46.0)
Hemoglobin: 13.2 g/dL (ref 12.0–15.0)
Lymphocytes Relative: 44.6 % (ref 12.0–46.0)
Lymphs Abs: 2.9 10*3/uL (ref 0.7–4.0)
MCHC: 31.9 g/dL (ref 30.0–36.0)
MCV: 89.4 fl (ref 78.0–100.0)
Monocytes Absolute: 0.8 10*3/uL (ref 0.1–1.0)
Monocytes Relative: 12.7 % — ABNORMAL HIGH (ref 3.0–12.0)
Neutro Abs: 2.6 10*3/uL (ref 1.4–7.7)
Neutrophils Relative %: 39.5 % — ABNORMAL LOW (ref 43.0–77.0)
Platelets: 268 10*3/uL (ref 150.0–400.0)
RBC: 4.64 Mil/uL (ref 3.87–5.11)
RDW: 15.7 % — ABNORMAL HIGH (ref 11.5–15.5)
WBC: 6.5 10*3/uL (ref 4.0–10.5)

## 2022-06-27 LAB — TSH: TSH: 0.15 u[IU]/mL — ABNORMAL LOW (ref 0.35–5.50)

## 2022-06-27 LAB — MAGNESIUM: Magnesium: 1.6 mg/dL (ref 1.5–2.5)

## 2022-06-28 ENCOUNTER — Other Ambulatory Visit (HOSPITAL_COMMUNITY): Payer: Self-pay

## 2022-06-28 ENCOUNTER — Other Ambulatory Visit: Payer: Self-pay | Admitting: Internal Medicine

## 2022-06-28 DIAGNOSIS — E039 Hypothyroidism, unspecified: Secondary | ICD-10-CM

## 2022-06-28 DIAGNOSIS — I10 Essential (primary) hypertension: Secondary | ICD-10-CM

## 2022-06-28 DIAGNOSIS — F411 Generalized anxiety disorder: Secondary | ICD-10-CM

## 2022-06-28 DIAGNOSIS — R600 Localized edema: Secondary | ICD-10-CM

## 2022-06-28 MED ORDER — LEVOTHYROXINE SODIUM 88 MCG PO TABS
88.0000 ug | ORAL_TABLET | Freq: Every day | ORAL | 0 refills | Status: DC
  Filled 2022-06-28: qty 90, 90d supply, fill #0

## 2022-06-28 MED ORDER — NEBIVOLOL HCL 2.5 MG PO TABS
2.5000 mg | ORAL_TABLET | Freq: Every day | ORAL | 0 refills | Status: DC
  Filled 2022-06-28: qty 90, 90d supply, fill #0

## 2022-06-28 MED ORDER — INDAPAMIDE 1.25 MG PO TABS
1.2500 mg | ORAL_TABLET | Freq: Every day | ORAL | 0 refills | Status: DC
  Filled 2022-06-28: qty 90, 90d supply, fill #0

## 2022-06-28 MED ORDER — ESCITALOPRAM OXALATE 10 MG PO TABS
10.0000 mg | ORAL_TABLET | Freq: Every day | ORAL | 0 refills | Status: DC
  Filled 2022-06-28: qty 90, 90d supply, fill #0

## 2022-06-30 ENCOUNTER — Other Ambulatory Visit (HOSPITAL_COMMUNITY): Payer: Self-pay

## 2022-07-01 ENCOUNTER — Other Ambulatory Visit (HOSPITAL_COMMUNITY): Payer: Self-pay

## 2022-07-08 ENCOUNTER — Other Ambulatory Visit (HOSPITAL_COMMUNITY): Payer: Self-pay

## 2022-07-13 ENCOUNTER — Encounter: Payer: Self-pay | Admitting: Internal Medicine

## 2022-07-13 ENCOUNTER — Other Ambulatory Visit: Payer: Self-pay | Admitting: Internal Medicine

## 2022-07-13 ENCOUNTER — Ambulatory Visit (INDEPENDENT_AMBULATORY_CARE_PROVIDER_SITE_OTHER): Payer: 59

## 2022-07-13 ENCOUNTER — Other Ambulatory Visit (HOSPITAL_COMMUNITY): Payer: Self-pay

## 2022-07-13 DIAGNOSIS — J0100 Acute maxillary sinusitis, unspecified: Secondary | ICD-10-CM | POA: Insufficient documentation

## 2022-07-13 DIAGNOSIS — Z23 Encounter for immunization: Secondary | ICD-10-CM | POA: Diagnosis not present

## 2022-07-13 MED ORDER — AMOXICILLIN-POT CLAVULANATE 875-125 MG PO TABS
1.0000 | ORAL_TABLET | Freq: Two times a day (BID) | ORAL | 0 refills | Status: AC
  Filled 2022-07-13: qty 20, 10d supply, fill #0

## 2022-07-13 NOTE — Progress Notes (Signed)
After obtaining consent, and per orders of Dr. Ronnald Ramp, injection of shingrix  given in the left deltoid by Marrian Salvage. Patient instructed to report any adverse reaction to me immediately.

## 2022-07-14 ENCOUNTER — Other Ambulatory Visit (HOSPITAL_COMMUNITY): Payer: Self-pay

## 2022-07-29 ENCOUNTER — Other Ambulatory Visit (HOSPITAL_COMMUNITY): Payer: Self-pay

## 2022-07-29 ENCOUNTER — Other Ambulatory Visit: Payer: Self-pay | Admitting: Internal Medicine

## 2022-07-29 MED ORDER — ESTRADIOL 0.05 MG/24HR TD PTTW
MEDICATED_PATCH | TRANSDERMAL | 1 refills | Status: DC
  Filled 2022-07-29: qty 8, 28d supply, fill #0
  Filled 2022-09-27: qty 8, 28d supply, fill #1

## 2022-09-27 ENCOUNTER — Other Ambulatory Visit: Payer: Self-pay | Admitting: Internal Medicine

## 2022-09-27 ENCOUNTER — Other Ambulatory Visit (HOSPITAL_COMMUNITY): Payer: Self-pay

## 2022-09-27 DIAGNOSIS — F411 Generalized anxiety disorder: Secondary | ICD-10-CM

## 2022-09-27 MED ORDER — ESCITALOPRAM OXALATE 10 MG PO TABS
10.0000 mg | ORAL_TABLET | Freq: Every day | ORAL | 0 refills | Status: DC
  Filled 2022-09-27: qty 90, 90d supply, fill #0

## 2022-10-05 ENCOUNTER — Other Ambulatory Visit (HOSPITAL_COMMUNITY): Payer: Self-pay

## 2022-10-05 ENCOUNTER — Other Ambulatory Visit: Payer: Self-pay | Admitting: Internal Medicine

## 2022-10-05 ENCOUNTER — Other Ambulatory Visit: Payer: Self-pay

## 2022-10-05 DIAGNOSIS — E039 Hypothyroidism, unspecified: Secondary | ICD-10-CM

## 2022-10-05 DIAGNOSIS — F411 Generalized anxiety disorder: Secondary | ICD-10-CM

## 2022-10-05 DIAGNOSIS — I10 Essential (primary) hypertension: Secondary | ICD-10-CM

## 2022-10-05 DIAGNOSIS — R600 Localized edema: Secondary | ICD-10-CM

## 2022-10-05 MED ORDER — ESTRADIOL 0.05 MG/24HR TD PTTW
MEDICATED_PATCH | TRANSDERMAL | 1 refills | Status: DC
  Filled 2022-10-05 – 2022-11-01 (×2): qty 8, 28d supply, fill #0
  Filled 2022-11-28: qty 8, 28d supply, fill #1

## 2022-10-05 MED ORDER — INDAPAMIDE 1.25 MG PO TABS
1.2500 mg | ORAL_TABLET | Freq: Every day | ORAL | 0 refills | Status: DC
  Filled 2022-10-05: qty 90, 90d supply, fill #0

## 2022-10-05 MED ORDER — NEBIVOLOL HCL 2.5 MG PO TABS
2.5000 mg | ORAL_TABLET | Freq: Every day | ORAL | 0 refills | Status: DC
  Filled 2022-10-05: qty 90, 90d supply, fill #0

## 2022-10-05 MED ORDER — LEVOTHYROXINE SODIUM 88 MCG PO TABS
88.0000 ug | ORAL_TABLET | Freq: Every day | ORAL | 0 refills | Status: DC
  Filled 2022-10-05: qty 90, 90d supply, fill #0

## 2022-10-05 MED ORDER — ESCITALOPRAM OXALATE 10 MG PO TABS
10.0000 mg | ORAL_TABLET | Freq: Every day | ORAL | 0 refills | Status: DC
  Filled 2022-10-05 – 2023-04-13 (×2): qty 90, 90d supply, fill #0

## 2022-10-06 ENCOUNTER — Other Ambulatory Visit: Payer: Self-pay

## 2022-10-07 ENCOUNTER — Telehealth: Payer: Self-pay | Admitting: Internal Medicine

## 2022-10-07 NOTE — Telephone Encounter (Signed)
Noted  

## 2022-10-07 NOTE — Telephone Encounter (Signed)
Patient states she just got Dr. Tamela Oddi letter about her FMLA on yesterday. States her leave will be up on 10/14/22 but she will be faxing FMLA forms today.

## 2022-10-10 ENCOUNTER — Other Ambulatory Visit (HOSPITAL_COMMUNITY): Payer: Self-pay

## 2022-10-10 NOTE — Telephone Encounter (Signed)
.  Type of form received: FMLA  Additional comments: To take care of pt's husband who is Dr. Tamela Oddi pt.   Received by: Adonis Brook  Form should be Faxed to: [phone]  Form should be mailed to:    Is patient requesting call for pickup:   Form placed:  In provider's box  Attach charge sheet. yes  Individual made aware of 3-5 business day turn around (Y/N)?  Yes

## 2022-10-13 NOTE — Telephone Encounter (Signed)
Forms has been faxed.

## 2022-11-01 ENCOUNTER — Other Ambulatory Visit (HOSPITAL_COMMUNITY): Payer: Self-pay

## 2022-11-03 ENCOUNTER — Other Ambulatory Visit (HOSPITAL_COMMUNITY): Payer: Self-pay

## 2022-11-22 ENCOUNTER — Ambulatory Visit (INDEPENDENT_AMBULATORY_CARE_PROVIDER_SITE_OTHER): Payer: 59 | Admitting: Family Medicine

## 2022-11-22 ENCOUNTER — Other Ambulatory Visit (HOSPITAL_COMMUNITY): Payer: Self-pay

## 2022-11-22 ENCOUNTER — Encounter: Payer: Self-pay | Admitting: Family Medicine

## 2022-11-22 VITALS — BP 112/74 | HR 95 | Temp 97.7°F | Ht 65.0 in

## 2022-11-22 DIAGNOSIS — J029 Acute pharyngitis, unspecified: Secondary | ICD-10-CM

## 2022-11-22 DIAGNOSIS — U071 COVID-19: Secondary | ICD-10-CM

## 2022-11-22 DIAGNOSIS — R051 Acute cough: Secondary | ICD-10-CM | POA: Diagnosis not present

## 2022-11-22 DIAGNOSIS — R0981 Nasal congestion: Secondary | ICD-10-CM

## 2022-11-22 DIAGNOSIS — R6883 Chills (without fever): Secondary | ICD-10-CM

## 2022-11-22 LAB — POCT RESPIRATORY SYNCYTIAL VIRUS: RSV Rapid Ag: NEGATIVE

## 2022-11-22 LAB — POC COVID19 BINAXNOW: SARS Coronavirus 2 Ag: POSITIVE — AB

## 2022-11-22 LAB — POCT INFLUENZA A/B
Influenza A, POC: NEGATIVE
Influenza B, POC: NEGATIVE

## 2022-11-22 MED ORDER — FLUTICASONE PROPIONATE 50 MCG/ACT NA SUSP
2.0000 | Freq: Every day | NASAL | 1 refills | Status: AC
  Filled 2022-11-22: qty 16, 30d supply, fill #0
  Filled 2023-07-19: qty 16, 30d supply, fill #1

## 2022-11-22 MED ORDER — IBUPROFEN 600 MG PO TABS
600.0000 mg | ORAL_TABLET | Freq: Three times a day (TID) | ORAL | 0 refills | Status: DC | PRN
  Filled 2022-11-22: qty 30, 10d supply, fill #0

## 2022-11-22 MED ORDER — NIRMATRELVIR/RITONAVIR (PAXLOVID)TABLET
3.0000 | ORAL_TABLET | Freq: Two times a day (BID) | ORAL | 0 refills | Status: AC
  Filled 2022-11-22: qty 30, 5d supply, fill #0

## 2022-11-22 MED ORDER — BENZONATATE 200 MG PO CAPS
200.0000 mg | ORAL_CAPSULE | Freq: Two times a day (BID) | ORAL | 0 refills | Status: DC | PRN
  Filled 2022-11-22: qty 20, 10d supply, fill #0

## 2022-11-22 NOTE — Progress Notes (Signed)
Subjective:  [name] is a 62 y.o. female who presents for a 4-5 day hx of fever, chills, body aches, ST, nasal congestion, cough, and mild shortness of breath.   Denies dizziness, chest pain, palpitations, vomiting or diarrhea.   Feels like she is hydrated. Urine normal   Reports hx of Covid 2 years ago and had to be hospitalized. States she had a PE and had to be on Eliquis at that time for 12 months.    No other aggravating or relieving factors.  No other c/o.  ROS as in subjective.   Objective: Vitals:   11/22/22 1120  BP: 112/74  Pulse: 95  Temp: 97.7 F (36.5 C)  SpO2: 98%    General appearance: Alert, WD/WN, no distress, mildly ill appearing                             Skin: warm and dry                    Eyes: conjunctiva normal, corneas clear   Cardiac: regular rate                          Lungs: CTA bilaterally, no wheezes, rales, or rhonchi. Respirations unlabored.       Assessment: COVID-19 virus infection - Plan: ibuprofen (ADVIL) 600 MG tablet, nirmatrelvir/ritonavir (PAXLOVID) 20 x 150 MG & 10 x '100MG'$  TABS  Acute cough - Plan: POC COVID-19, POCT respiratory syncytial virus, POCT Influenza A/B, benzonatate (TESSALON) 200 MG capsule  Nasal congestion - Plan: POC COVID-19, POCT respiratory syncytial virus, POCT Influenza A/B, fluticasone (FLONASE) 50 MCG/ACT nasal spray  Chills - Plan: POC COVID-19, POCT respiratory syncytial virus, POCT Influenza A/B  Sore throat - Plan: POC COVID-19, POCT respiratory syncytial virus, POCT Influenza A/B   Plan: Positive for COVID.  Negative flu and RSV. Paxlovid prescribed.  Counseling on how to take the medication and potential side effects. She is not in any acute distress at this time but strict precautions to go to the emergency department if she feels dehydrated, has chest pain, oxygen saturation less than 90% or worsening shortness of breath. Ibuprofen 600 mg prescribed.  Tessalon Perles prescribed.   Fluticasone nasal spray prescribed. Counseling on symptomatic management. Out of work note provided

## 2022-11-22 NOTE — Patient Instructions (Signed)
Start the War for Arial today.  Most common side effects are a metallic taste in your mouth and occasionally loose stools.   Take the ibuprofen with food as needed for headache, body aches, sore throat, etc.  Stay well-hydrated.  Do salt water gargles for your sore throat.  Use throat lozenges.  Use the Flonase nasal spray for nasal congestion.  Take Mucinex for cough and congestion  You may use the prescribed Tessalon Perles for cough also.  Monitor your oxygen level at home.  If you are consistently lower than 90% then you should go to the emergency department.  If you develop chest pain or worsening shortness of breath or feel like you are dehydrated then go to the emergency department.

## 2022-12-06 ENCOUNTER — Other Ambulatory Visit (HOSPITAL_COMMUNITY): Payer: Self-pay

## 2022-12-08 ENCOUNTER — Other Ambulatory Visit (HOSPITAL_COMMUNITY): Payer: Self-pay

## 2023-01-12 ENCOUNTER — Other Ambulatory Visit: Payer: Self-pay | Admitting: Internal Medicine

## 2023-01-12 ENCOUNTER — Other Ambulatory Visit (HOSPITAL_COMMUNITY): Payer: Self-pay

## 2023-01-12 DIAGNOSIS — R600 Localized edema: Secondary | ICD-10-CM

## 2023-01-12 DIAGNOSIS — I10 Essential (primary) hypertension: Secondary | ICD-10-CM

## 2023-01-13 ENCOUNTER — Other Ambulatory Visit (HOSPITAL_COMMUNITY): Payer: Self-pay

## 2023-01-13 ENCOUNTER — Telehealth: Payer: Self-pay | Admitting: Internal Medicine

## 2023-01-13 NOTE — Telephone Encounter (Signed)
Prescription Request  01/13/2023  LOV: 04/28/2022  What is the name of the medication or equipment? Bystolic   Have you contacted your pharmacy to request a refill? Yes   Which pharmacy would you like this sent to?  [place] [facility] [address] [place] [address] Phone: [phone] Fax: [phone]   Patient notified that their request is being sent to the clinical staff for review and that they should receive a response within 2 business days.   Please advise at Mobile [phone] (mobile)

## 2023-01-16 ENCOUNTER — Telehealth: Payer: Self-pay | Admitting: Internal Medicine

## 2023-01-16 ENCOUNTER — Other Ambulatory Visit (HOSPITAL_COMMUNITY): Payer: Self-pay

## 2023-01-16 ENCOUNTER — Other Ambulatory Visit: Payer: Self-pay | Admitting: Internal Medicine

## 2023-01-16 DIAGNOSIS — R600 Localized edema: Secondary | ICD-10-CM

## 2023-01-16 DIAGNOSIS — I10 Essential (primary) hypertension: Secondary | ICD-10-CM

## 2023-01-16 MED ORDER — NEBIVOLOL HCL 2.5 MG PO TABS
2.5000 mg | ORAL_TABLET | Freq: Every day | ORAL | 0 refills | Status: DC
  Filled 2023-01-16: qty 90, 90d supply, fill #0

## 2023-01-16 NOTE — Telephone Encounter (Signed)
Prescription Request  01/16/2023  LOV: 04/28/2022  What is the name of the medication or equipment? nebivolol (BYSTOLIC) 2.5 MG tablet  indapamide (LOZOL) 1.25 MG tablet  Pt want know if she can get a temporary refill until her upcoming appt.  Have you contacted your pharmacy to request a refill? No   Which pharmacy would you like this sent to?   [place] [facility] [address] [place] [address] Phone: [phone] Fax: [phone]  Patient notified that their request is being sent to the clinical staff for review and that they should receive a response within 2 business days.   Please advise at Mobile [phone] (mobile)

## 2023-01-18 ENCOUNTER — Other Ambulatory Visit (HOSPITAL_COMMUNITY): Payer: Self-pay

## 2023-01-18 MED ORDER — INDAPAMIDE 1.25 MG PO TABS
1.2500 mg | ORAL_TABLET | Freq: Every day | ORAL | 0 refills | Status: DC
  Filled 2023-01-18: qty 90, 90d supply, fill #0

## 2023-01-18 NOTE — Telephone Encounter (Signed)
Patient said she still needs the indapamide (LOZOL) 1.25 MG tablet sent in. She only has two pills left until her appointment on 01/26/23. Best callback is [phone].

## 2023-01-26 ENCOUNTER — Encounter: Payer: Self-pay | Admitting: Internal Medicine

## 2023-01-26 ENCOUNTER — Ambulatory Visit (INDEPENDENT_AMBULATORY_CARE_PROVIDER_SITE_OTHER): Payer: 59 | Admitting: Internal Medicine

## 2023-01-26 VITALS — BP 128/88 | HR 83 | Temp 98.8°F | Resp 16 | Ht 65.0 in | Wt 218.0 lb

## 2023-01-26 DIAGNOSIS — E669 Obesity, unspecified: Secondary | ICD-10-CM | POA: Diagnosis not present

## 2023-01-26 DIAGNOSIS — N182 Chronic kidney disease, stage 2 (mild): Secondary | ICD-10-CM

## 2023-01-26 DIAGNOSIS — I10 Essential (primary) hypertension: Secondary | ICD-10-CM | POA: Diagnosis not present

## 2023-01-26 DIAGNOSIS — Z0001 Encounter for general adult medical examination with abnormal findings: Secondary | ICD-10-CM | POA: Diagnosis not present

## 2023-01-26 DIAGNOSIS — E039 Hypothyroidism, unspecified: Secondary | ICD-10-CM

## 2023-01-26 DIAGNOSIS — E559 Vitamin D deficiency, unspecified: Secondary | ICD-10-CM | POA: Diagnosis not present

## 2023-01-26 DIAGNOSIS — N1831 Chronic kidney disease, stage 3a: Secondary | ICD-10-CM

## 2023-01-26 DIAGNOSIS — Z1231 Encounter for screening mammogram for malignant neoplasm of breast: Secondary | ICD-10-CM

## 2023-01-26 LAB — CBC WITH DIFFERENTIAL/PLATELET
Basophils Absolute: 0.1 10*3/uL (ref 0.0–0.1)
Basophils Relative: 1.1 % (ref 0.0–3.0)
Eosinophils Absolute: 0.1 10*3/uL (ref 0.0–0.7)
Eosinophils Relative: 1 % (ref 0.0–5.0)
HCT: 44.4 % (ref 36.0–46.0)
Hemoglobin: 14.5 g/dL (ref 12.0–15.0)
Lymphocytes Relative: 46.2 % — ABNORMAL HIGH (ref 12.0–46.0)
Lymphs Abs: 2.7 10*3/uL (ref 0.7–4.0)
MCHC: 32.7 g/dL (ref 30.0–36.0)
MCV: 89.7 fl (ref 78.0–100.0)
Monocytes Absolute: 0.6 10*3/uL (ref 0.1–1.0)
Monocytes Relative: 10.4 % (ref 3.0–12.0)
Neutro Abs: 2.5 10*3/uL (ref 1.4–7.7)
Neutrophils Relative %: 41.3 % — ABNORMAL LOW (ref 43.0–77.0)
Platelets: 294 10*3/uL (ref 150.0–400.0)
RBC: 4.95 Mil/uL (ref 3.87–5.11)
RDW: 15.1 % (ref 11.5–15.5)
WBC: 5.9 10*3/uL (ref 4.0–10.5)

## 2023-01-26 LAB — LIPID PANEL
Cholesterol: 205 mg/dL — ABNORMAL HIGH (ref 0–200)
HDL: 107.8 mg/dL (ref >39.00)
LDL Cholesterol: 89 mg/dL (ref 0–99)
NonHDL: 97.37
Total CHOL/HDL Ratio: 2
Triglycerides: 44 mg/dL (ref 0.0–149.0)
VLDL: 8.8 mg/dL (ref 0.0–40.0)

## 2023-01-26 LAB — URINALYSIS, ROUTINE W REFLEX MICROSCOPIC
Bilirubin Urine: NEGATIVE
Hgb urine dipstick: NEGATIVE
Ketones, ur: NEGATIVE
Leukocytes,Ua: NEGATIVE
Nitrite: NEGATIVE
Specific Gravity, Urine: 1.02 (ref 1.000–1.030)
Total Protein, Urine: NEGATIVE
Urine Glucose: NEGATIVE
Urobilinogen, UA: 1 (ref 0.0–1.0)
pH: 6.5 (ref 5.0–8.0)

## 2023-01-26 LAB — HEPATIC FUNCTION PANEL
ALT: 13 U/L (ref 0–35)
AST: 15 U/L (ref 0–37)
Albumin: 4.4 g/dL (ref 3.5–5.2)
Alkaline Phosphatase: 53 U/L (ref 39–117)
Bilirubin, Direct: 0.1 mg/dL (ref 0.0–0.3)
Total Bilirubin: 0.6 mg/dL (ref 0.2–1.2)
Total Protein: 8 g/dL (ref 6.0–8.3)

## 2023-01-26 LAB — VITAMIN D 25 HYDROXY (VIT D DEFICIENCY, FRACTURES): VITD: 24.95 ng/mL — ABNORMAL LOW (ref 30.00–100.00)

## 2023-01-26 LAB — TSH: TSH: 0.46 u[IU]/mL (ref 0.35–5.50)

## 2023-01-26 LAB — HEMOGLOBIN A1C: Hgb A1c MFr Bld: 5.7 % (ref 4.6–6.5)

## 2023-01-26 NOTE — Progress Notes (Unsigned)
Subjective:  Patient ID: [name], female    DOB: 29-May-1961  Age: 62 y.o. MRN: [id number]  CC: Annual Exam, Hypertension, and Hypothyroidism   HPI [name] presents for a CPX and f/up ----  She complains of chronic fatigue but she walks a mile a day and has good endurance.  She denies chest pain, shortness of breath, diaphoresis, or edema.  Outpatient Medications Prior to Visit  Medication Sig Dispense Refill   escitalopram (LEXAPRO) 10 MG tablet Take 1 tablet (10 mg total) by mouth daily. 90 tablet 0   estradiol (VIVELLE-DOT) 0.05 MG/24HR patch Apply 1 patch to skin topically 2 times a week (discontinue oral estrogen tablets) 8 patch 1   fluticasone (FLONASE) 50 MCG/ACT nasal spray Place 2 sprays into both nostrils daily. 16 g 1   ibuprofen (ADVIL) 600 MG tablet Take 1 tablet (600 mg total) by mouth every 8 (eight) hours as needed. 30 tablet 0   indapamide (LOZOL) 1.25 MG tablet Take 1 tablet (1.25 mg total) by mouth daily. 90 tablet 0   levothyroxine (SYNTHROID) 88 MCG tablet Take 1 tablet (88 mcg total) by mouth daily. 90 tablet 0   nebivolol (BYSTOLIC) 2.5 MG tablet Take 1 tablet (2.5 mg total) by mouth daily. 90 tablet 0   potassium chloride SA (KLOR-CON M15) 15 MEQ tablet Take 1 tablet (15 mEq total) by mouth 3 (three) times daily. 270 tablet 1   progesterone (PROMETRIUM) 200 MG capsule Take 1 - 2 capsules by mouth once nightly 60 capsule 2   progesterone (PROMETRIUM) 200 MG capsule Take 1 capsule by mouth once a day at night as directed 90 capsule 1   No facility-administered medications prior to visit.    ROS Review of Systems  Constitutional:  Positive for fatigue. Negative for appetite change and unexpected weight change.  HENT: Negative.    Eyes: Negative.  Negative for visual disturbance.  Respiratory:  Negative for cough, chest tightness, shortness of breath and wheezing.   Cardiovascular:  Negative for chest pain, palpitations and leg swelling.   Gastrointestinal:  Negative for abdominal pain, constipation, diarrhea, nausea and vomiting.  Endocrine: Negative.  Negative for polyuria.  Musculoskeletal: Negative.  Negative for arthralgias and myalgias.  Skin: Negative.  Negative for color change.  Neurological:  Negative for dizziness, weakness, light-headedness and headaches.  Hematological:  Negative for adenopathy. Does not bruise/bleed easily.  Psychiatric/Behavioral: Negative.      Objective:  BP 128/88 (BP Location: Left Arm, Patient Position: Sitting, Cuff Size: Normal)   Pulse 83   Temp 98.8 F (37.1 C) (Oral)   Resp 16   Ht 5\' 5"  (1.651 m)   Wt 218 lb (98.9 kg)   LMP 06/18/2015   SpO2 98%   BMI 36.28 kg/m   BP Readings from Last 3 Encounters:  01/26/23 128/88  11/22/22 112/74  04/28/22 124/82    Wt Readings from Last 3 Encounters:  01/26/23 218 lb (98.9 kg)  04/28/22 211 lb 4 oz (95.8 kg)  10/05/21 207 lb (93.9 kg)    Physical Exam Vitals reviewed.  Constitutional:      Appearance: Normal appearance.  HENT:     Nose: Nose normal.     Mouth/Throat:     Mouth: Mucous membranes are moist.  Eyes:     General: No scleral icterus.    Conjunctiva/sclera: Conjunctivae normal.  Cardiovascular:     Rate and Rhythm: Normal rate and regular rhythm.     Heart sounds: Normal heart  sounds, S1 normal and S2 normal.     No friction rub. No gallop.     Comments: EKG- NSR, 76 bpm No LVH or Q waves Normal EKG Pulmonary:     Effort: Pulmonary effort is normal.     Breath sounds: No stridor. No wheezing, rhonchi or rales.  Abdominal:     General: Abdomen is flat.     Palpations: There is no mass.     Tenderness: There is no abdominal tenderness. There is no guarding.     Hernia: No hernia is present.  Musculoskeletal:        General: Normal range of motion.     Cervical back: Neck supple.     Right lower leg: No edema.     Left lower leg: No edema.  Skin:    General: Skin is warm and dry.  Neurological:      General: No focal deficit present.     Mental Status: She is alert. Mental status is at baseline.     Lab Results  Component Value Date   WBC 5.9 01/26/2023   HGB 14.5 01/26/2023   HCT 44.4 01/26/2023   PLT 294.0 01/26/2023   GLUCOSE 78 01/27/2023   CHOL 205 (H) 01/26/2023   TRIG 44.0 01/26/2023   HDL 107.80 01/26/2023   LDLCALC 89 01/26/2023   ALT 13 01/26/2023   AST 15 01/26/2023   NA 136 01/27/2023   K 3.6 01/27/2023   CL 98 01/27/2023   CREATININE 0.76 01/27/2023   BUN 13 01/27/2023   CO2 27 01/27/2023   TSH 0.46 01/26/2023   INR 1.1 10/16/2019   HGBA1C 5.7 01/26/2023    VAS [place] ABI WITH/WO TBI  Result Date: 10/08/2021  LOWER EXTREMITY DOPPLER STUDY Patient Name:  [name]  Date of Exam:   10/08/2021 Medical Rec #: [id number]         Accession #:    [id number] Date of Birth: September 30, 1960        Patient Gender: F Patient Age:   37 years Exam Location:  [facility] Procedure:      VAS [place] ABI WITH/WO TBI Referring Phys: Sanda Linger --------------------------------------------------------------------------------  Indications: Claudication. High Risk Factors: Hypertension, past history of smoking.  Performing Technologist: Hardie Lora RVT  Examination Guidelines: A complete evaluation includes at minimum, Doppler waveform signals and systolic blood pressure reading at the level of bilateral brachial, anterior tibial, and posterior tibial arteries, when vessel segments are accessible. Bilateral testing is considered an integral part of a complete examination. Photoelectric Plethysmograph (PPG) waveforms and toe systolic pressure readings are included as required and additional duplex testing as needed. Limited examinations for reoccurring indications may be performed as noted.  ABI Findings: +---------+------------------+-----+---------+--------+ Right    Rt Pressure (mmHg)IndexWaveform Comment  +---------+------------------+-----+---------+--------+  Brachial 140                                      +---------+------------------+-----+---------+--------+ PTA      168               1.20 triphasic         +---------+------------------+-----+---------+--------+ DP       159               1.14 triphasic         +---------+------------------+-----+---------+--------+ Domingo Sep  0.94                   +---------+------------------+-----+---------+--------+ +---------+------------------+-----+---------+-------+ Left     Lt Pressure (mmHg)IndexWaveform Comment +---------+------------------+-----+---------+-------+ Brachial 134                                     +---------+------------------+-----+---------+-------+ PTA      148               1.06 triphasic        +---------+------------------+-----+---------+-------+ DP       133               0.95 triphasic        +---------+------------------+-----+---------+-------+ Great Toe144               1.03                  +---------+------------------+-----+---------+-------+ +-------+-----------+-----------+------------+------------+ ABI/TBIToday's ABIToday's TBIPrevious ABIPrevious TBI +-------+-----------+-----------+------------+------------+ Right  1.20       0.94                                +-------+-----------+-----------+------------+------------+ Left   1.06       1.03                                +-------+-----------+-----------+------------+------------+  Findings reported to [facility] at 15:44. Summary: Right: Resting right ankle-brachial index is within normal range. No evidence of significant right lower extremity arterial disease. The right toe-brachial index is normal. Left: Resting left ankle-brachial index is within normal range. No evidence of significant left lower extremity arterial disease. The left toe-brachial index is normal.  *See table(s) above for measurements and observations.  Electronically signed by  Gerarda Fraction on 10/08/2021 at 5:05:11 PM.    Final    VAS [place] LOWER EXTREMITY VENOUS (DVT)  Result Date: 10/08/2021  Lower Venous DVT Study Patient Name:  ANEEKA JEN  Date of Exam:   10/08/2021 Medical Rec #: [id number]         Accession #:    [id number] Date of Birth: 12-Mar-1961        Patient Gender: F Patient Age:   21 years Exam Location:  [facility] Procedure:      VAS [place] LOWER EXTREMITY VENOUS (DVT) Referring Phys: Sanda Linger --------------------------------------------------------------------------------  Indications: Swelling, and Pain.  Comparison Study: Prior bilateral venous duplex 01/10/2020 was negative for DVT                   bilaterally. Performing Technologist: Hardie Lora RVT  Examination Guidelines: A complete evaluation includes B-mode imaging, spectral Doppler, color Doppler, and power Doppler as needed of all accessible portions of each vessel. Bilateral testing is considered an integral part of a complete examination. Limited examinations for reoccurring indications may be performed as noted. The reflux portion of the exam is performed with the patient in reverse Trendelenburg.  +---------+---------------+---------+-----------+----------+--------------+ RIGHT    CompressibilityPhasicitySpontaneityPropertiesThrombus Aging +---------+---------------+---------+-----------+----------+--------------+ CFV      Full           Yes      Yes                                 +---------+---------------+---------+-----------+----------+--------------+  SFJ      Full           Yes      Yes                                 +---------+---------------+---------+-----------+----------+--------------+ FV Prox  Full           Yes      Yes                                 +---------+---------------+---------+-----------+----------+--------------+ FV Mid   Full           Yes      Yes                                  +---------+---------------+---------+-----------+----------+--------------+ FV DistalFull           Yes      Yes                                 +---------+---------------+---------+-----------+----------+--------------+ POP      Full           Yes      Yes                                 +---------+---------------+---------+-----------+----------+--------------+ GSV      Full                    Yes                                 +---------+---------------+---------+-----------+----------+--------------+ SSV      Full                                                        +---------+---------------+---------+-----------+----------+--------------+   +---------+---------------+---------+-----------+----------+--------------+ LEFT     CompressibilityPhasicitySpontaneityPropertiesThrombus Aging +---------+---------------+---------+-----------+----------+--------------+ CFV      Full           No       Yes                                 +---------+---------------+---------+-----------+----------+--------------+ SFJ      Full           No       Yes                                 +---------+---------------+---------+-----------+----------+--------------+ FV Prox  Full           No       Yes                                 +---------+---------------+---------+-----------+----------+--------------+ FV Mid   Full           No  Yes                                 +---------+---------------+---------+-----------+----------+--------------+ FV DistalFull           No       Yes                                 +---------+---------------+---------+-----------+----------+--------------+ POP      Full           Yes      Yes                                 +---------+---------------+---------+-----------+----------+--------------+ PTV      Full                                                         +---------+---------------+---------+-----------+----------+--------------+ GSV      Full                                                        +---------+---------------+---------+-----------+----------+--------------+ SSV      Full                                                        +---------+---------------+---------+-----------+----------+--------------+    Findings reported to [facility] at 15:44.  Summary: RIGHT: - No evidence of deep vein thrombosis in the lower extremity. No indirect evidence of obstruction proximal to the inguinal ligament. - There is no evidence of deep vein thrombosis in the lower extremity. - There is no evidence of superficial venous thrombosis.  - No cystic structure found in the popliteal fossa.  LEFT: - No evidence of deep vein thrombosis in the lower extremity. No indirect evidence of obstruction proximal to the inguinal ligament. - There is no evidence of deep vein thrombosis in the lower extremity. - There is no evidence of superficial venous thrombosis.  - No cystic structure found in the popliteal fossa.  *See table(s) above for measurements and observations. Electronically signed by Gerarda Fraction on 10/08/2021 at 5:05:02 PM.    Final     Assessment & Plan:   Essential hypertension- Her blood pressure is well-controlled. -     TSH; Future -     Urinalysis, Routine w reflex microscopic; Future -     CBC with Differential/Platelet; Future -     Hepatic function panel; Future -     EKG 12-Lead -     Basic metabolic panel; Future  Stage 3a chronic kidney disease (HCC)- Her renal function has improved. -     Urinalysis, Routine w reflex microscopic; Future -     Hepatic function panel; Future -     Basic metabolic panel; Future  Encounter for general adult medical examination with abnormal findings- Exam completed, labs reviewed, vaccines  reviewed, cancer screenings addressed, patient education was given. -     Lipid panel; Future  Acquired  hypothyroidism- She is euthyroid.  Will continue the current T4 dosage. -     TSH; Future -     Hepatic function panel; Future  Obesity (BMI 35.0-39.9 without comorbidity)- Labs are negative for secondary causes or complications. -     TSH; Future -     Hemoglobin A1c; Future -     Hepatic function panel; Future  Vitamin D deficiency disease -     VITAMIN D 25 Hydroxy (Vit-D Deficiency, Fractures); Future  Screening mammogram for breast cancer -     Digital Screening Mammogram, Left and Right; Future  Chronic kidney disease, stage 2, mildly decreased GFR- Will avoid nephrotoxic agents.     Follow-up: Return in about 6 months (around 07/29/2023).  Sanda Linger, MD

## 2023-01-26 NOTE — Patient Instructions (Signed)

## 2023-01-27 ENCOUNTER — Ambulatory Visit (INDEPENDENT_AMBULATORY_CARE_PROVIDER_SITE_OTHER): Payer: 59

## 2023-01-27 ENCOUNTER — Encounter: Payer: Self-pay | Admitting: Internal Medicine

## 2023-01-27 ENCOUNTER — Other Ambulatory Visit: Payer: Self-pay | Admitting: Internal Medicine

## 2023-01-27 DIAGNOSIS — Z1231 Encounter for screening mammogram for malignant neoplasm of breast: Secondary | ICD-10-CM | POA: Insufficient documentation

## 2023-01-27 DIAGNOSIS — I10 Essential (primary) hypertension: Secondary | ICD-10-CM

## 2023-01-27 DIAGNOSIS — N1831 Chronic kidney disease, stage 3a: Secondary | ICD-10-CM

## 2023-01-27 DIAGNOSIS — N182 Chronic kidney disease, stage 2 (mild): Secondary | ICD-10-CM | POA: Insufficient documentation

## 2023-01-27 LAB — BASIC METABOLIC PANEL
BUN: 13 mg/dL (ref 6–23)
CO2: 27 mEq/L (ref 19–32)
Calcium: 9.5 mg/dL (ref 8.4–10.5)
Chloride: 98 mEq/L (ref 96–112)
Creatinine, Ser: 0.76 mg/dL (ref 0.40–1.20)
GFR: 84.52 mL/min (ref >60.00)
Glucose, Bld: 78 mg/dL (ref 70–99)
Potassium: 3.6 mEq/L (ref 3.5–5.1)
Sodium: 136 mEq/L (ref 135–145)

## 2023-01-28 ENCOUNTER — Other Ambulatory Visit: Payer: Self-pay | Admitting: Internal Medicine

## 2023-01-28 MED ORDER — LEVOTHYROXINE SODIUM 88 MCG PO TABS
88.0000 ug | ORAL_TABLET | Freq: Every day | ORAL | 1 refills | Status: DC
Start: 2023-01-28 — End: 2023-10-12
  Filled 2023-01-28 – 2023-02-08 (×2): qty 90, 90d supply, fill #0
  Filled 2023-04-13 – 2023-05-26 (×2): qty 90, 90d supply, fill #1

## 2023-01-30 ENCOUNTER — Other Ambulatory Visit (HOSPITAL_COMMUNITY): Payer: Self-pay

## 2023-01-30 ENCOUNTER — Other Ambulatory Visit: Payer: Self-pay

## 2023-02-07 ENCOUNTER — Other Ambulatory Visit (HOSPITAL_COMMUNITY): Payer: Self-pay

## 2023-02-08 ENCOUNTER — Other Ambulatory Visit: Payer: Self-pay | Admitting: Internal Medicine

## 2023-02-08 ENCOUNTER — Other Ambulatory Visit (HOSPITAL_COMMUNITY): Payer: Self-pay

## 2023-02-08 MED ORDER — ESTRADIOL 0.05 MG/24HR TD PTTW
1.0000 | MEDICATED_PATCH | TRANSDERMAL | 1 refills | Status: DC
  Filled 2023-02-08: qty 8, 28d supply, fill #0
  Filled 2023-04-13: qty 8, 28d supply, fill #1

## 2023-02-09 ENCOUNTER — Other Ambulatory Visit (HOSPITAL_COMMUNITY): Payer: Self-pay

## 2023-03-15 ENCOUNTER — Ambulatory Visit
Admission: RE | Admit: 2023-03-15 | Discharge: 2023-03-15 | Disposition: A | Discharge status: Home / Self Care | Payer: 59 | Source: Ambulatory Visit | Attending: Internal Medicine | Admitting: Internal Medicine

## 2023-03-15 DIAGNOSIS — Z1231 Encounter for screening mammogram for malignant neoplasm of breast: Secondary | ICD-10-CM | POA: Diagnosis not present

## 2023-04-13 ENCOUNTER — Other Ambulatory Visit: Payer: Self-pay | Admitting: Internal Medicine

## 2023-04-13 ENCOUNTER — Other Ambulatory Visit (HOSPITAL_COMMUNITY): Payer: Self-pay

## 2023-04-13 DIAGNOSIS — R600 Localized edema: Secondary | ICD-10-CM

## 2023-04-13 DIAGNOSIS — E876 Hypokalemia: Secondary | ICD-10-CM

## 2023-04-13 DIAGNOSIS — I10 Essential (primary) hypertension: Secondary | ICD-10-CM

## 2023-04-13 MED ORDER — INDAPAMIDE 1.25 MG PO TABS
1.2500 mg | ORAL_TABLET | Freq: Every day | ORAL | 0 refills | Status: DC
Start: 2023-04-13 — End: 2023-05-26
  Filled 2023-04-13: qty 90, 90d supply, fill #0

## 2023-04-13 MED ORDER — POTASSIUM CHLORIDE CRYS ER 15 MEQ PO TBCR
15.0000 meq | EXTENDED_RELEASE_TABLET | Freq: Three times a day (TID) | ORAL | 1 refills | Status: DC
Start: 2023-04-13 — End: 2023-10-12
  Filled 2023-04-13: qty 270, 90d supply, fill #0
  Filled 2023-05-26 – 2023-07-13 (×2): qty 270, 90d supply, fill #1

## 2023-04-13 MED ORDER — NEBIVOLOL HCL 2.5 MG PO TABS
2.5000 mg | ORAL_TABLET | Freq: Every day | ORAL | 0 refills | Status: DC
Start: 2023-04-13 — End: 2023-05-26
  Filled 2023-04-13: qty 90, 90d supply, fill #0

## 2023-04-14 ENCOUNTER — Other Ambulatory Visit: Payer: Self-pay

## 2023-04-14 ENCOUNTER — Other Ambulatory Visit (HOSPITAL_COMMUNITY): Payer: Self-pay

## 2023-04-18 ENCOUNTER — Other Ambulatory Visit (HOSPITAL_COMMUNITY): Payer: Self-pay

## 2023-04-19 ENCOUNTER — Other Ambulatory Visit: Payer: Self-pay | Admitting: Internal Medicine

## 2023-04-19 ENCOUNTER — Telehealth: Payer: Self-pay | Admitting: Internal Medicine

## 2023-04-19 ENCOUNTER — Other Ambulatory Visit (HOSPITAL_COMMUNITY): Payer: Self-pay

## 2023-04-19 NOTE — Telephone Encounter (Signed)
Prescription Request  04/19/2023  LOV: 01/26/2023  What is the name of the medication or equipment? progesterone (PROMETRIUM) 200 MG capsule [   Have you contacted your pharmacy to request a refill? No   Which pharmacy would you like this sent to?     [facility] [address] [place] [address] Phone: [phone] Fax: [phone]  WPatient notified that their request is being sent to the clinical staff for review and that they should receive a response within 2 business days.   Please advise at Mobile [phone] (mobile)

## 2023-04-20 ENCOUNTER — Other Ambulatory Visit: Payer: Self-pay | Admitting: Internal Medicine

## 2023-04-20 ENCOUNTER — Encounter: Payer: Self-pay | Admitting: Internal Medicine

## 2023-04-22 ENCOUNTER — Other Ambulatory Visit (HOSPITAL_COMMUNITY): Payer: Self-pay

## 2023-04-26 ENCOUNTER — Other Ambulatory Visit (HOSPITAL_COMMUNITY): Payer: Self-pay

## 2023-04-27 ENCOUNTER — Other Ambulatory Visit (HOSPITAL_COMMUNITY): Payer: Self-pay

## 2023-04-27 MED ORDER — PROGESTERONE 200 MG PO CAPS
200.0000 mg | ORAL_CAPSULE | Freq: Every evening | ORAL | 1 refills | Status: DC
  Filled 2023-04-27: qty 90, 90d supply, fill #0
  Filled 2023-05-26 – 2023-07-13 (×2): qty 90, 90d supply, fill #1

## 2023-05-26 ENCOUNTER — Other Ambulatory Visit: Payer: Self-pay | Admitting: Internal Medicine

## 2023-05-26 ENCOUNTER — Other Ambulatory Visit: Payer: Self-pay

## 2023-05-26 ENCOUNTER — Other Ambulatory Visit (HOSPITAL_COMMUNITY): Payer: Self-pay

## 2023-05-26 DIAGNOSIS — F411 Generalized anxiety disorder: Secondary | ICD-10-CM

## 2023-05-26 DIAGNOSIS — I10 Essential (primary) hypertension: Secondary | ICD-10-CM

## 2023-05-26 DIAGNOSIS — R600 Localized edema: Secondary | ICD-10-CM

## 2023-05-26 MED ORDER — NEBIVOLOL HCL 2.5 MG PO TABS
2.5000 mg | ORAL_TABLET | Freq: Every day | ORAL | 0 refills | Status: DC
Start: 2023-05-26 — End: 2023-10-12
  Filled 2023-05-26 – 2023-07-13 (×2): qty 90, 90d supply, fill #0

## 2023-05-26 MED ORDER — INDAPAMIDE 1.25 MG PO TABS
1.2500 mg | ORAL_TABLET | Freq: Every day | ORAL | 0 refills | Status: DC
Start: 2023-05-26 — End: 2023-10-12
  Filled 2023-05-26 – 2023-07-13 (×2): qty 90, 90d supply, fill #0

## 2023-05-26 MED ORDER — ESCITALOPRAM OXALATE 10 MG PO TABS
10.0000 mg | ORAL_TABLET | Freq: Every day | ORAL | 0 refills | Status: DC
Start: 2023-05-26 — End: 2023-10-12
  Filled 2023-05-26 – 2023-07-13 (×2): qty 90, 90d supply, fill #0

## 2023-05-26 MED ORDER — ESTRADIOL 0.05 MG/24HR TD PTTW
1.0000 | MEDICATED_PATCH | TRANSDERMAL | 1 refills | Status: DC
  Filled 2023-05-26: qty 8, 28d supply, fill #0
  Filled 2023-07-13: qty 8, 28d supply, fill #1

## 2023-05-30 ENCOUNTER — Other Ambulatory Visit (HOSPITAL_COMMUNITY): Payer: Self-pay

## 2023-06-01 ENCOUNTER — Other Ambulatory Visit (HOSPITAL_COMMUNITY): Payer: Self-pay

## 2023-06-03 ENCOUNTER — Other Ambulatory Visit (HOSPITAL_COMMUNITY): Payer: Self-pay

## 2023-07-13 ENCOUNTER — Other Ambulatory Visit: Payer: Self-pay

## 2023-07-13 ENCOUNTER — Other Ambulatory Visit (HOSPITAL_COMMUNITY): Payer: Self-pay

## 2023-07-13 ENCOUNTER — Other Ambulatory Visit: Payer: Self-pay | Admitting: Internal Medicine

## 2023-07-13 DIAGNOSIS — E039 Hypothyroidism, unspecified: Secondary | ICD-10-CM

## 2023-07-21 ENCOUNTER — Other Ambulatory Visit (HOSPITAL_COMMUNITY): Payer: Self-pay

## 2023-07-29 ENCOUNTER — Other Ambulatory Visit (HOSPITAL_COMMUNITY): Payer: Self-pay

## 2023-07-29 ENCOUNTER — Encounter (HOSPITAL_COMMUNITY): Payer: Self-pay

## 2023-10-12 ENCOUNTER — Other Ambulatory Visit (HOSPITAL_COMMUNITY): Payer: Self-pay

## 2023-10-12 ENCOUNTER — Other Ambulatory Visit: Payer: Self-pay | Admitting: Internal Medicine

## 2023-10-12 DIAGNOSIS — F411 Generalized anxiety disorder: Secondary | ICD-10-CM

## 2023-10-12 DIAGNOSIS — E876 Hypokalemia: Secondary | ICD-10-CM

## 2023-10-12 DIAGNOSIS — I10 Essential (primary) hypertension: Secondary | ICD-10-CM

## 2023-10-12 DIAGNOSIS — R600 Localized edema: Secondary | ICD-10-CM

## 2023-10-12 DIAGNOSIS — E039 Hypothyroidism, unspecified: Secondary | ICD-10-CM

## 2023-10-12 MED ORDER — LEVOTHYROXINE SODIUM 88 MCG PO TABS
88.0000 ug | ORAL_TABLET | Freq: Every day | ORAL | 1 refills | Status: DC
Start: 2023-10-12 — End: 2023-11-21
  Filled 2023-10-12 – 2023-10-26 (×2): qty 90, 90d supply, fill #0

## 2023-10-12 MED ORDER — NEBIVOLOL HCL 2.5 MG PO TABS
2.5000 mg | ORAL_TABLET | Freq: Every day | ORAL | 0 refills | Status: DC
Start: 2023-10-12 — End: 2023-11-21
  Filled 2023-10-12 – 2023-10-26 (×2): qty 90, 90d supply, fill #0

## 2023-10-12 MED ORDER — INDAPAMIDE 1.25 MG PO TABS
1.2500 mg | ORAL_TABLET | Freq: Every day | ORAL | 0 refills | Status: DC
Start: 2023-10-12 — End: 2023-11-21
  Filled 2023-10-12 – 2023-10-26 (×2): qty 90, 90d supply, fill #0

## 2023-10-12 MED ORDER — POTASSIUM CHLORIDE CRYS ER 15 MEQ PO TBCR
15.0000 meq | EXTENDED_RELEASE_TABLET | Freq: Three times a day (TID) | ORAL | 1 refills | Status: DC
Start: 2023-10-12 — End: 2024-06-18
  Filled 2023-10-12 – 2024-04-01 (×2): qty 270, 90d supply, fill #0

## 2023-10-12 MED ORDER — ESCITALOPRAM OXALATE 10 MG PO TABS
10.0000 mg | ORAL_TABLET | Freq: Every day | ORAL | 0 refills | Status: DC
Start: 2023-10-12 — End: 2023-11-20
  Filled 2023-10-12: qty 90, 90d supply, fill #0

## 2023-10-12 MED ORDER — ESTRADIOL 0.05 MG/24HR TD PTTW
1.0000 | MEDICATED_PATCH | TRANSDERMAL | 1 refills | Status: DC
  Filled 2023-10-12 – 2023-10-26 (×2): qty 8, 28d supply, fill #0
  Filled 2024-05-08: qty 8, 28d supply, fill #1

## 2023-10-12 NOTE — Telephone Encounter (Signed)
Patient overdue for F/U. Sent MyChart message in regards to scheduling prior to refilling medications.

## 2023-10-13 ENCOUNTER — Other Ambulatory Visit (HOSPITAL_COMMUNITY): Payer: Self-pay

## 2023-10-23 ENCOUNTER — Other Ambulatory Visit (HOSPITAL_COMMUNITY): Payer: Self-pay

## 2023-10-23 MED ORDER — PROGESTERONE 200 MG PO CAPS
200.0000 mg | ORAL_CAPSULE | Freq: Every day | ORAL | 1 refills | Status: DC
  Filled 2023-10-23: qty 90, 90d supply, fill #0
  Filled 2024-05-08 – 2024-06-01 (×3): qty 90, 90d supply, fill #1

## 2023-10-24 ENCOUNTER — Other Ambulatory Visit (HOSPITAL_COMMUNITY): Payer: Self-pay

## 2023-10-26 ENCOUNTER — Other Ambulatory Visit (HOSPITAL_COMMUNITY): Payer: Self-pay

## 2023-11-02 ENCOUNTER — Other Ambulatory Visit (HOSPITAL_COMMUNITY): Payer: Self-pay

## 2023-11-03 ENCOUNTER — Other Ambulatory Visit (HOSPITAL_COMMUNITY): Payer: Self-pay

## 2023-11-03 ENCOUNTER — Telehealth: Payer: Commercial Managed Care - PPO

## 2023-11-03 DIAGNOSIS — J019 Acute sinusitis, unspecified: Secondary | ICD-10-CM | POA: Diagnosis not present

## 2023-11-03 DIAGNOSIS — J40 Bronchitis, not specified as acute or chronic: Secondary | ICD-10-CM

## 2023-11-03 DIAGNOSIS — B9689 Other specified bacterial agents as the cause of diseases classified elsewhere: Secondary | ICD-10-CM | POA: Diagnosis not present

## 2023-11-03 MED ORDER — DOXYCYCLINE HYCLATE 100 MG PO TABS
100.0000 mg | ORAL_TABLET | Freq: Two times a day (BID) | ORAL | 0 refills | Status: AC
  Filled 2023-11-03: qty 20, 10d supply, fill #0

## 2023-11-03 MED ORDER — PROMETHAZINE-DM 6.25-15 MG/5ML PO SYRP
5.0000 mL | ORAL_SOLUTION | Freq: Four times a day (QID) | ORAL | 0 refills | Status: AC | PRN
  Filled 2023-11-03: qty 118, 6d supply, fill #0

## 2023-11-03 NOTE — Progress Notes (Signed)
Virtual Visit Consent   [name], you are scheduled for a virtual visit with a [facility] provider today. Just as with appointments in the office, your consent must be obtained to participate. Your consent will be active for this visit and any virtual visit you may have with one of our providers in the next 365 days. If you have a MyChart account, a copy of this consent can be sent to you electronically.  As this is a virtual visit, video technology does not allow for your provider to perform a traditional examination. This may limit your provider's ability to fully assess your condition. If your provider identifies any concerns that need to be evaluated in person or the need to arrange testing (such as labs, EKG, etc.), we will make arrangements to do so. Although advances in technology are sophisticated, we cannot ensure that it will always work on either your end or our end. If the connection with a video visit is poor, the visit may have to be switched to a telephone visit. With either a video or telephone visit, we are not always able to ensure that we have a secure connection.  By engaging in this virtual visit, you consent to the provision of healthcare and authorize for your insurance to be billed (if applicable) for the services provided during this visit. Depending on your insurance coverage, you may receive a charge related to this service.  I need to obtain your verbal consent now. Are you willing to proceed with your visit today? [name] has provided verbal consent on 11/03/2023 for a virtual visit (video or telephone). Georgana Curio, FNP  Date: 11/03/2023 7:58 AM   Virtual Visit via Video Note   I, Georgana Curio, connected with  [name]  ([id number], 10/14/1960) on 11/03/23 at  8:00 AM EST by a video-enabled telemedicine application and verified that I am speaking with the correct person using two identifiers.  Location: Patient: Virtual Visit Location Patient:  Home Provider: Virtual Visit Location Provider: Home Office   I discussed the limitations of evaluation and management by telemedicine and the availability of in person appointments. The patient expressed understanding and agreed to proceed.    History of Present Illness: [name] is a 63 y.o. who identifies as a female who was assigned female at birth, and is being seen today for cough, productive with green and brown mucus, no fever, sinus pain and pressure with post nasal drainage. Sx for 2 weeks worsening. Cough is keeping her up at night. Marland Kitchen  HPI: HPI  Problems:  Patient Active Problem List   Diagnosis Date Noted   Screening mammogram for breast cancer 01/27/2023   Chronic kidney disease, stage 2, mildly decreased GFR 01/27/2023   Encounter for general adult medical examination with abnormal findings 01/26/2023   GAD (generalized anxiety disorder) 09/16/2021   Acquired hypothyroidism 09/07/2021   Cervical disc disorder with radiculopathy of cervical region 04/14/2020   PCOS (polycystic ovarian syndrome) 05/16/2019   Vitamin D deficiency disease 05/16/2019   Obesity (BMI 35.0-39.9 without comorbidity) 05/16/2019   Allergic rhinitis 11/25/2010   Essential hypertension 07/21/2010    Allergies:  Allergies  Allergen Reactions   Oxycodone-Acetaminophen Itching    TYLOX ALSO   Medications:  Current Outpatient Medications:    doxycycline (VIBRA-TABS) 100 MG tablet, Take 1 tablet (100 mg total) by mouth 2 (two) times daily for 10 days., Disp: 20 tablet, Rfl: 0   promethazine-dextromethorphan (PROMETHAZINE-DM) 6.25-15 MG/5ML syrup, Take 5  mLs by mouth 4 (four) times daily as needed for up to 10 days for cough., Disp: 118 mL, Rfl: 0   escitalopram (LEXAPRO) 10 MG tablet, Take 1 tablet (10 mg total) by mouth daily., Disp: 90 tablet, Rfl: 0   estradiol (VIVELLE-DOT) 0.05 MG/24HR patch, Place 1 patch onto the skin 2 times a week. (discontinue oral estrogen tablets), Disp: 8 patch,  Rfl: 1   fluticasone (FLONASE) 50 MCG/ACT nasal spray, Place 2 sprays into both nostrils daily., Disp: 16 g, Rfl: 1   ibuprofen (ADVIL) 600 MG tablet, Take 1 tablet (600 mg total) by mouth every 8 (eight) hours as needed., Disp: 30 tablet, Rfl: 0   indapamide (LOZOL) 1.25 MG tablet, Take 1 tablet (1.25 mg total) by mouth daily., Disp: 90 tablet, Rfl: 0   levothyroxine (SYNTHROID) 88 MCG tablet, Take 1 tablet (88 mcg total) by mouth daily., Disp: 90 tablet, Rfl: 1   nebivolol (BYSTOLIC) 2.5 MG tablet, Take 1 tablet (2.5 mg total) by mouth daily., Disp: 90 tablet, Rfl: 0   potassium chloride SA (KLOR-CON M15) 15 MEQ tablet, Take 1 tablet (15 mEq total) by mouth 3 (three) times daily., Disp: 270 tablet, Rfl: 1   progesterone (PROMETRIUM) 200 MG capsule, Take 1 - 2 capsules by mouth once nightly, Disp: 60 capsule, Rfl: 2   progesterone (PROMETRIUM) 200 MG capsule, Take 1 capsule (200 mg total) by mouth at bedtime as directed, Disp: 90 capsule, Rfl: 1  Observations/Objective: Patient is well-developed, well-nourished in no acute distress.  Resting comfortably  at home.  Head is normocephalic, atraumatic.  No labored breathing.  Speech is clear and coherent with logical content.  Patient is alert and oriented at baseline.   Assessment and Plan: 1. Acute bacterial sinusitis (Primary)  2. Bronchitis  Increase fluids, humidifier at night, tylenol or ibuprofen as directed, mucinex otc, UC if sx worsen.   Follow Up Instructions: I discussed the assessment and treatment plan with the patient. The patient was provided an opportunity to ask questions and all were answered. The patient agreed with the plan and demonstrated an understanding of the instructions.  A copy of instructions were sent to the patient via MyChart unless otherwise noted below.     The patient was advised to call back or seek an in-person evaluation if the symptoms worsen or if the condition fails to improve as anticipated.     Georgana Curio, FNP

## 2023-11-03 NOTE — Patient Instructions (Signed)

## 2023-11-20 ENCOUNTER — Other Ambulatory Visit (HOSPITAL_COMMUNITY): Payer: Self-pay

## 2023-11-20 ENCOUNTER — Encounter: Payer: Self-pay | Admitting: Internal Medicine

## 2023-11-20 ENCOUNTER — Other Ambulatory Visit: Payer: Self-pay

## 2023-11-20 ENCOUNTER — Ambulatory Visit (INDEPENDENT_AMBULATORY_CARE_PROVIDER_SITE_OTHER): Payer: 59 | Admitting: Internal Medicine

## 2023-11-20 VITALS — BP 142/92 | HR 85 | Temp 98.2°F | Resp 16 | Ht 65.0 in | Wt 228.6 lb

## 2023-11-20 DIAGNOSIS — E039 Hypothyroidism, unspecified: Secondary | ICD-10-CM

## 2023-11-20 DIAGNOSIS — Z6838 Body mass index (BMI) 38.0-38.9, adult: Secondary | ICD-10-CM | POA: Diagnosis not present

## 2023-11-20 DIAGNOSIS — F411 Generalized anxiety disorder: Secondary | ICD-10-CM

## 2023-11-20 DIAGNOSIS — E669 Obesity, unspecified: Secondary | ICD-10-CM | POA: Diagnosis not present

## 2023-11-20 DIAGNOSIS — I10 Essential (primary) hypertension: Secondary | ICD-10-CM | POA: Diagnosis not present

## 2023-11-20 LAB — BASIC METABOLIC PANEL
BUN: 12 mg/dL (ref 6–23)
CO2: 29 meq/L (ref 19–32)
Calcium: 9.5 mg/dL (ref 8.4–10.5)
Chloride: 100 meq/L (ref 96–112)
Creatinine, Ser: 0.73 mg/dL (ref 0.40–1.20)
GFR: 88.2 mL/min (ref >60.00)
Glucose, Bld: 100 mg/dL — ABNORMAL HIGH (ref 70–99)
Potassium: 3.7 meq/L (ref 3.5–5.1)
Sodium: 138 meq/L (ref 135–145)

## 2023-11-20 LAB — TSH: TSH: 0.13 u[IU]/mL — ABNORMAL LOW (ref 0.35–5.50)

## 2023-11-20 LAB — CBC WITH DIFFERENTIAL/PLATELET
Basophils Absolute: 0 10*3/uL (ref 0.0–0.1)
Basophils Relative: 0.8 % (ref 0.0–3.0)
Eosinophils Absolute: 0.1 10*3/uL (ref 0.0–0.7)
Eosinophils Relative: 1.2 % (ref 0.0–5.0)
HCT: 42.8 % (ref 36.0–46.0)
Hemoglobin: 13.6 g/dL (ref 12.0–15.0)
Lymphocytes Relative: 41.4 % (ref 12.0–46.0)
Lymphs Abs: 2.5 10*3/uL (ref 0.7–4.0)
MCHC: 31.8 g/dL (ref 30.0–36.0)
MCV: 93.2 fl (ref 78.0–100.0)
Monocytes Absolute: 0.8 10*3/uL (ref 0.1–1.0)
Monocytes Relative: 12.7 % — ABNORMAL HIGH (ref 3.0–12.0)
Neutro Abs: 2.6 10*3/uL (ref 1.4–7.7)
Neutrophils Relative %: 43.9 % (ref 43.0–77.0)
Platelets: 271 10*3/uL (ref 150.0–400.0)
RBC: 4.59 Mil/uL (ref 3.87–5.11)
RDW: 16 % — ABNORMAL HIGH (ref 11.5–15.5)
WBC: 6 10*3/uL (ref 4.0–10.5)

## 2023-11-20 LAB — HEPATIC FUNCTION PANEL
ALT: 19 U/L (ref 0–35)
AST: 21 U/L (ref 0–37)
Albumin: 4.4 g/dL (ref 3.5–5.2)
Alkaline Phosphatase: 56 U/L (ref 39–117)
Bilirubin, Direct: 0.1 mg/dL (ref 0.0–0.3)
Total Bilirubin: 0.5 mg/dL (ref 0.2–1.2)
Total Protein: 7.9 g/dL (ref 6.0–8.3)

## 2023-11-20 LAB — HEMOGLOBIN A1C: Hgb A1c MFr Bld: 5.9 % (ref 4.6–6.5)

## 2023-11-20 MED ORDER — ALPRAZOLAM 0.25 MG PO TABS
0.2500 mg | ORAL_TABLET | Freq: Three times a day (TID) | ORAL | 0 refills | Status: AC | PRN
Start: 2023-11-20 — End: ?
  Filled 2023-11-20: qty 270, 90d supply, fill #0

## 2023-11-20 MED ORDER — ESCITALOPRAM OXALATE 20 MG PO TABS
20.0000 mg | ORAL_TABLET | Freq: Every day | ORAL | 0 refills | Status: DC
Start: 2023-11-20 — End: 2024-02-16
  Filled 2023-11-20: qty 90, 90d supply, fill #0

## 2023-11-20 NOTE — Patient Instructions (Signed)
 Hypertension, Adult High blood pressure (hypertension) is when the force of blood pumping through the arteries is too strong. The arteries are the blood vessels that carry blood from the heart throughout the body. Hypertension forces the heart to work harder to pump blood and may cause arteries to become narrow or stiff. Untreated or uncontrolled hypertension can lead to a heart attack, heart failure, a stroke, kidney disease, and other problems. A blood pressure reading consists of a higher number over a lower number. Ideally, your blood pressure should be below 120/80. The first ("top") number is called the systolic pressure. It is a measure of the pressure in your arteries as your heart beats. The second ("bottom") number is called the diastolic pressure. It is a measure of the pressure in your arteries as the heart relaxes. What are the causes? The exact cause of this condition is not known. There are some conditions that result in high blood pressure. What increases the risk? Certain factors may make you more likely to develop high blood pressure. Some of these risk factors are under your control, including: Smoking. Not getting enough exercise or physical activity. Being overweight. Having too much fat, sugar, calories, or salt (sodium) in your diet. Drinking too much alcohol. Other risk factors include: Having a personal history of heart disease, diabetes, high cholesterol, or kidney disease. Stress. Having a family history of high blood pressure and high cholesterol. Having obstructive sleep apnea. Age. The risk increases with age. What are the signs or symptoms? High blood pressure may not cause symptoms. Very high blood pressure (hypertensive crisis) may cause: Headache. Fast or irregular heartbeats (palpitations). Shortness of breath. Nosebleed. Nausea and vomiting. Vision changes. Severe chest pain, dizziness, and seizures. How is this diagnosed? This condition is diagnosed by  measuring your blood pressure while you are seated, with your arm resting on a flat surface, your legs uncrossed, and your feet flat on the floor. The cuff of the blood pressure monitor will be placed directly against the skin of your upper arm at the level of your heart. Blood pressure should be measured at least twice using the same arm. Certain conditions can cause a difference in blood pressure between your right and left arms. If you have a high blood pressure reading during one visit or you have normal blood pressure with other risk factors, you may be asked to: Return on a different day to have your blood pressure checked again. Monitor your blood pressure at home for 1 week or longer. If you are diagnosed with hypertension, you may have other blood or imaging tests to help your health care provider understand your overall risk for other conditions. How is this treated? This condition is treated by making healthy lifestyle changes, such as eating healthy foods, exercising more, and reducing your alcohol intake. You may be referred for counseling on a healthy diet and physical activity. Your health care provider may prescribe medicine if lifestyle changes are not enough to get your blood pressure under control and if: Your systolic blood pressure is above 130. Your diastolic blood pressure is above 80. Your personal target blood pressure may vary depending on your medical conditions, your age, and other factors. Follow these instructions at home: Eating and drinking  Eat a diet that is high in fiber and potassium, and low in sodium, added sugar, and fat. An example of this eating plan is called the DASH diet. DASH stands for Dietary Approaches to Stop Hypertension. To eat this way: Eat  plenty of fresh fruits and vegetables. Try to fill one half of your plate at each meal with fruits and vegetables. Eat whole grains, such as whole-wheat pasta, brown rice, or whole-grain bread. Fill about one  fourth of your plate with whole grains. Eat or drink low-fat dairy products, such as skim milk or low-fat yogurt. Avoid fatty cuts of meat, processed or cured meats, and poultry with skin. Fill about one fourth of your plate with lean proteins, such as fish, chicken without skin, beans, eggs, or tofu. Avoid pre-made and processed foods. These tend to be higher in sodium, added sugar, and fat. Reduce your daily sodium intake. Many people with hypertension should eat less than 1,500 mg of sodium a day. Do not drink alcohol if: Your health care provider tells you not to drink. You are pregnant, may be pregnant, or are planning to become pregnant. If you drink alcohol: Limit how much you have to: 0-1 drink a day for women. 0-2 drinks a day for men. Know how much alcohol is in your drink. In the [place] one drink equals one 12 oz bottle of beer (355 mL), one 5 oz glass of wine (148 mL), or one 1 oz glass of hard liquor (44 mL). Lifestyle  Work with your health care provider to maintain a healthy body weight or to lose weight. Ask what an ideal weight is for you. Get at least 30 minutes of exercise that causes your heart to beat faster (aerobic exercise) most days of the week. Activities may include walking, swimming, or biking. Include exercise to strengthen your muscles (resistance exercise), such as Pilates or lifting weights, as part of your weekly exercise routine. Try to do these types of exercises for 30 minutes at least 3 days a week. Do not use any products that contain nicotine or tobacco. These products include cigarettes, chewing tobacco, and vaping devices, such as e-cigarettes. If you need help quitting, ask your health care provider. Monitor your blood pressure at home as told by your health care provider. Keep all follow-up visits. This is important. Medicines Take over-the-counter and prescription medicines only as told by your health care provider. Follow directions carefully. Blood  pressure medicines must be taken as prescribed. Do not skip doses of blood pressure medicine. Doing this puts you at risk for problems and can make the medicine less effective. Ask your health care provider about side effects or reactions to medicines that you should watch for. Contact a health care provider if you: Think you are having a reaction to a medicine you are taking. Have headaches that keep coming back (recurring). Feel dizzy. Have swelling in your ankles. Have trouble with your vision. Get help right away if you: Develop a severe headache or confusion. Have unusual weakness or numbness. Feel faint. Have severe pain in your chest or abdomen. Vomit repeatedly. Have trouble breathing. These symptoms may be an emergency. Get help right away. Call 911. Do not wait to see if the symptoms will go away. Do not drive yourself to the hospital. Summary Hypertension is when the force of blood pumping through your arteries is too strong. If this condition is not controlled, it may put you at risk for serious complications. Your personal target blood pressure may vary depending on your medical conditions, your age, and other factors. For most people, a normal blood pressure is less than 120/80. Hypertension is treated with lifestyle changes, medicines, or a combination of both. Lifestyle changes include losing weight, eating a healthy,  low-sodium diet, exercising more, and limiting alcohol. This information is not intended to replace advice given to you by your health care provider. Make sure you discuss any questions you have with your health care provider. Document Revised: 07/06/2021 Document Reviewed: 07/06/2021 Elsevier Patient Education  2024 [employer].

## 2023-11-20 NOTE — Progress Notes (Unsigned)
 Subjective:  Patient ID: [name], female    DOB: 04/26/1961  Age: 63 y.o. MRN: [id number]  CC: Hypertension and Hypothyroidism   HPI [name] presents for f/up ----  Discussed the use of AI scribe software for clinical note transcription with the patient, who gave verbal consent to proceed.  History of Present Illness   The patient presents with concerns about heart disease and medication management.  She has no current symptoms of chest pain, shortness of breath, dizziness, or lightheadedness during physical activity. Her last EKG was performed last year during her annual physical. She has a significant family history of heart disease, with her brother, nephew, and father having died from it. Her nephew passed away in 07-06-2024 at the age of 66.  Her husband recently died. She is experiencing anxiety and is currently taking Lexapro, which she feels is not helping. She wants to restart Xanax, which she used previously.  She reports gaining weight and feels that her current thyroid medication dose may be too low, as she felt better on a previous dose.  She received a flu shot last fall without any side effects. She denies any side effects from her current medications. Her last colonoscopy was ten years ago.       Outpatient Medications Prior to Visit  Medication Sig Dispense Refill   estradiol (VIVELLE-DOT) 0.05 MG/24HR patch Place 1 patch onto the skin 2 times a week. (discontinue oral estrogen tablets) 8 patch 1   fluticasone (FLONASE) 50 MCG/ACT nasal spray Place 2 sprays into both nostrils daily. 16 g 1   potassium chloride SA (KLOR-CON M15) 15 MEQ tablet Take 1 tablet (15 mEq total) by mouth 3 (three) times daily. 270 tablet 1   progesterone (PROMETRIUM) 200 MG capsule Take 1 capsule (200 mg total) by mouth at bedtime as directed (Patient taking differently: Take 200 mg by mouth at bedtime. 1-2 capsules at night) 90 capsule 1   escitalopram (LEXAPRO) 10 MG tablet  Take 1 tablet (10 mg total) by mouth daily. 90 tablet 0   ibuprofen (ADVIL) 600 MG tablet Take 1 tablet (600 mg total) by mouth every 8 (eight) hours as needed. 30 tablet 0   indapamide (LOZOL) 1.25 MG tablet Take 1 tablet (1.25 mg total) by mouth daily. 90 tablet 0   levothyroxine (SYNTHROID) 88 MCG tablet Take 1 tablet (88 mcg total) by mouth daily. 90 tablet 1   nebivolol (BYSTOLIC) 2.5 MG tablet Take 1 tablet (2.5 mg total) by mouth daily. 90 tablet 0   progesterone (PROMETRIUM) 200 MG capsule Take 1 - 2 capsules by mouth once nightly 60 capsule 2   No facility-administered medications prior to visit.    ROS Review of Systems  Objective:  BP (!) 142/92 (BP Location: Left Arm, Patient Position: Sitting, Cuff Size: Large)   Pulse 85   Temp 98.2 F (36.8 C) (Oral)   Resp 16   Ht 5\' 5"  (1.651 m)   Wt 228 lb 9.6 oz (103.7 kg)   LMP 06/18/2015   SpO2 98%   BMI 38.04 kg/m   BP Readings from Last 3 Encounters:  11/20/23 (!) 142/92  01/26/23 128/88  11/22/22 112/74    Wt Readings from Last 3 Encounters:  11/20/23 228 lb 9.6 oz (103.7 kg)  01/26/23 218 lb (98.9 kg)  04/28/22 211 lb 4 oz (95.8 kg)    Physical Exam Vitals reviewed.  Constitutional:      General: She is not in acute distress.  Appearance: She is obese. She is not ill-appearing, toxic-appearing or diaphoretic.  HENT:     Nose: Nose normal.     Mouth/Throat:     Mouth: Mucous membranes are moist.  Eyes:     General: No scleral icterus.    Conjunctiva/sclera: Conjunctivae normal.  Cardiovascular:     Rate and Rhythm: Normal rate and regular rhythm.     Heart sounds: No murmur heard.    No friction rub. No gallop.     Comments: EKG- NSR, 77 bpm No LVH, Q waves, or ST/T wave changes  Pulmonary:     Effort: Pulmonary effort is normal.     Breath sounds: No stridor. No wheezing, rhonchi or rales.  Abdominal:     General: Abdomen is protuberant. There is no distension.     Palpations: There is no mass.      Tenderness: There is no abdominal tenderness. There is no guarding.     Hernia: No hernia is present.  Musculoskeletal:        General: Normal range of motion.     Cervical back: Neck supple.     Right lower leg: No edema.     Left lower leg: No edema.  Lymphadenopathy:     Cervical: No cervical adenopathy.  Skin:    General: Skin is warm and dry.  Neurological:     General: No focal deficit present.     Mental Status: Mental status is at baseline.  Psychiatric:        Mood and Affect: Mood is anxious. Affect is tearful.     Lab Results  Component Value Date   WBC 6.0 11/20/2023   HGB 13.6 11/20/2023   HCT 42.8 11/20/2023   PLT 271.0 11/20/2023   GLUCOSE 100 (H) 11/20/2023   CHOL 205 (H) 01/26/2023   TRIG 44.0 01/26/2023   HDL 107.80 01/26/2023   LDLCALC 89 01/26/2023   ALT 19 11/20/2023   AST 21 11/20/2023   NA 138 11/20/2023   K 3.7 11/20/2023   CL 100 11/20/2023   CREATININE 0.73 11/20/2023   BUN 12 11/20/2023   CO2 29 11/20/2023   TSH 0.13 (L) 11/20/2023   INR 1.1 10/16/2019   HGBA1C 5.9 11/20/2023    MM 3D SCREENING MAMMOGRAM BILATERAL BREAST Result Date: 03/17/2023 CLINICAL DATA:  Screening. EXAM: DIGITAL SCREENING BILATERAL MAMMOGRAM WITH TOMOSYNTHESIS AND CAD TECHNIQUE: Bilateral screening digital craniocaudal and mediolateral oblique mammograms were obtained. Bilateral screening digital breast tomosynthesis was performed. The images were evaluated with computer-aided detection. COMPARISON:  Previous exam(s). ACR Breast Density Category b: There are scattered areas of fibroglandular density. FINDINGS: There are no findings suspicious for malignancy. IMPRESSION: No mammographic evidence of malignancy. A result letter of this screening mammogram will be mailed directly to the patient. RECOMMENDATION: Screening mammogram in one year. (Code:SM-B-01Y) BI-RADS CATEGORY  1: Negative. Electronically Signed   By: Harmon Pier M.D.   On: 03/17/2023 12:44    Assessment  & Plan:   Essential hypertension- She has not achieved her BP goal. -     CBC with Differential/Platelet; Future -     TSH; Future -     Urinalysis, Routine w reflex microscopic; Future -     Hepatic function panel; Future -     Basic metabolic panel; Future -     EKG 12-Lead -     Indapamide; Take 1 tablet (1.25 mg total) by mouth daily.  Dispense: 90 tablet; Refill: 0 -     Nebivolol  HCl; Take 1 tablet (5 mg total) by mouth daily.  Dispense: 90 tablet; Refill: 0  Obesity (BMI 35.0-39.9 without comorbidity) -     TSH; Future -     Hepatic function panel; Future -     Hemoglobin A1c; Future  GAD (generalized anxiety disorder) -     TSH; Future -     Escitalopram Oxalate; Take 1 tablet (20 mg total) by mouth daily.  Dispense: 90 tablet; Refill: 0 -     ALPRAZolam; Take 1 tablet (0.25 mg total) by mouth 3 (three) times daily as needed for anxiety.  Dispense: 270 tablet; Refill: 0  Acquired hypothyroidism- TSH is suppressed. Will lower her T4 dose. -     TSH; Future -     Levothyroxine Sodium; Take 1 tablet (75 mcg total) by mouth daily.  Dispense: 90 tablet; Refill: 0     Follow-up: Return in about 3 months (around 02/20/2024).  Sanda Linger, MD

## 2023-11-21 ENCOUNTER — Other Ambulatory Visit (HOSPITAL_COMMUNITY): Payer: Self-pay

## 2023-11-21 LAB — URINALYSIS, ROUTINE W REFLEX MICROSCOPIC
Bilirubin Urine: NEGATIVE
Hgb urine dipstick: NEGATIVE
Ketones, ur: NEGATIVE
Leukocytes,Ua: NEGATIVE
Nitrite: NEGATIVE
RBC / HPF: NONE SEEN (ref 0–?)
Specific Gravity, Urine: 1.02 (ref 1.000–1.030)
Total Protein, Urine: NEGATIVE
Urine Glucose: NEGATIVE
Urobilinogen, UA: 1 (ref 0.0–1.0)
pH: 6.5 (ref 5.0–8.0)

## 2023-11-21 MED ORDER — INDAPAMIDE 1.25 MG PO TABS
1.2500 mg | ORAL_TABLET | Freq: Every day | ORAL | 0 refills | Status: DC
Start: 2023-11-21 — End: 2024-05-08
  Filled 2023-11-21 – 2024-02-08 (×2): qty 90, 90d supply, fill #0

## 2023-11-21 MED ORDER — NEBIVOLOL HCL 5 MG PO TABS
5.0000 mg | ORAL_TABLET | Freq: Every day | ORAL | 0 refills | Status: AC
Start: 2023-11-21 — End: ?
  Filled 2023-11-21: qty 90, 90d supply, fill #0

## 2023-11-21 MED ORDER — LEVOTHYROXINE SODIUM 75 MCG PO TABS
75.0000 ug | ORAL_TABLET | Freq: Every day | ORAL | 0 refills | Status: DC
Start: 2023-11-21 — End: 2024-05-08
  Filled 2023-11-21: qty 90, 90d supply, fill #0
  Filled 2023-11-21: qty 52, 52d supply, fill #0

## 2023-11-22 ENCOUNTER — Encounter: Payer: Self-pay | Admitting: Internal Medicine

## 2023-12-16 ENCOUNTER — Other Ambulatory Visit (HOSPITAL_COMMUNITY): Payer: Self-pay

## 2024-01-03 DIAGNOSIS — Z0279 Encounter for issue of other medical certificate: Secondary | ICD-10-CM

## 2024-01-19 DIAGNOSIS — Z6838 Body mass index (BMI) 38.0-38.9, adult: Secondary | ICD-10-CM | POA: Diagnosis not present

## 2024-01-19 DIAGNOSIS — E669 Obesity, unspecified: Secondary | ICD-10-CM | POA: Diagnosis not present

## 2024-01-19 DIAGNOSIS — R635 Abnormal weight gain: Secondary | ICD-10-CM | POA: Diagnosis not present

## 2024-01-22 ENCOUNTER — Other Ambulatory Visit (HOSPITAL_COMMUNITY): Payer: Self-pay

## 2024-01-22 DIAGNOSIS — J189 Pneumonia, unspecified organism: Secondary | ICD-10-CM | POA: Diagnosis not present

## 2024-01-22 DIAGNOSIS — B349 Viral infection, unspecified: Secondary | ICD-10-CM | POA: Diagnosis not present

## 2024-01-22 DIAGNOSIS — Z20822 Contact with and (suspected) exposure to covid-19: Secondary | ICD-10-CM | POA: Diagnosis not present

## 2024-01-22 MED ORDER — DOXYCYCLINE HYCLATE 100 MG PO TABS
100.0000 mg | ORAL_TABLET | Freq: Two times a day (BID) | ORAL | 0 refills | Status: DC
  Filled 2024-01-22: qty 14, 7d supply, fill #0

## 2024-01-22 MED ORDER — PROMETHAZINE-DM 6.25-15 MG/5ML PO SYRP
5.0000 mL | ORAL_SOLUTION | ORAL | 0 refills | Status: DC | PRN
  Filled 2024-01-22: qty 210, 7d supply, fill #0

## 2024-01-31 ENCOUNTER — Other Ambulatory Visit (HOSPITAL_COMMUNITY): Payer: Self-pay

## 2024-01-31 DIAGNOSIS — R635 Abnormal weight gain: Secondary | ICD-10-CM | POA: Diagnosis not present

## 2024-01-31 DIAGNOSIS — Z6837 Body mass index (BMI) 37.0-37.9, adult: Secondary | ICD-10-CM | POA: Diagnosis not present

## 2024-01-31 DIAGNOSIS — N951 Menopausal and female climacteric states: Secondary | ICD-10-CM | POA: Diagnosis not present

## 2024-01-31 DIAGNOSIS — Z1331 Encounter for screening for depression: Secondary | ICD-10-CM | POA: Diagnosis not present

## 2024-01-31 DIAGNOSIS — Z1339 Encounter for screening examination for other mental health and behavioral disorders: Secondary | ICD-10-CM | POA: Diagnosis not present

## 2024-01-31 DIAGNOSIS — I10 Essential (primary) hypertension: Secondary | ICD-10-CM | POA: Diagnosis not present

## 2024-01-31 DIAGNOSIS — E669 Obesity, unspecified: Secondary | ICD-10-CM | POA: Diagnosis not present

## 2024-01-31 MED ORDER — PROGESTERONE 200 MG PO CAPS
200.0000 mg | ORAL_CAPSULE | Freq: Every day | ORAL | 2 refills | Status: DC
  Filled 2024-01-31: qty 60, 30d supply, fill #0
  Filled 2024-05-08: qty 60, 30d supply, fill #1
  Filled 2024-06-24: qty 60, 30d supply, fill #2

## 2024-02-08 ENCOUNTER — Other Ambulatory Visit (HOSPITAL_COMMUNITY): Payer: Self-pay

## 2024-02-08 ENCOUNTER — Other Ambulatory Visit: Payer: Self-pay

## 2024-02-13 DIAGNOSIS — Z6836 Body mass index (BMI) 36.0-36.9, adult: Secondary | ICD-10-CM | POA: Diagnosis not present

## 2024-02-13 DIAGNOSIS — R5383 Other fatigue: Secondary | ICD-10-CM | POA: Diagnosis not present

## 2024-02-13 DIAGNOSIS — E559 Vitamin D deficiency, unspecified: Secondary | ICD-10-CM | POA: Diagnosis not present

## 2024-02-13 DIAGNOSIS — N951 Menopausal and female climacteric states: Secondary | ICD-10-CM | POA: Diagnosis not present

## 2024-02-13 DIAGNOSIS — E039 Hypothyroidism, unspecified: Secondary | ICD-10-CM | POA: Diagnosis not present

## 2024-02-13 DIAGNOSIS — I10 Essential (primary) hypertension: Secondary | ICD-10-CM | POA: Diagnosis not present

## 2024-02-16 ENCOUNTER — Other Ambulatory Visit (HOSPITAL_COMMUNITY): Payer: Self-pay

## 2024-02-16 ENCOUNTER — Encounter: Payer: Self-pay | Admitting: Internal Medicine

## 2024-02-16 ENCOUNTER — Ambulatory Visit: Admitting: Internal Medicine

## 2024-02-16 ENCOUNTER — Ambulatory Visit (INDEPENDENT_AMBULATORY_CARE_PROVIDER_SITE_OTHER)

## 2024-02-16 VITALS — BP 138/84 | HR 73 | Temp 98.3°F | Resp 16 | Ht 65.0 in | Wt 225.2 lb

## 2024-02-16 DIAGNOSIS — E039 Hypothyroidism, unspecified: Secondary | ICD-10-CM

## 2024-02-16 DIAGNOSIS — I10 Essential (primary) hypertension: Secondary | ICD-10-CM

## 2024-02-16 DIAGNOSIS — R052 Subacute cough: Secondary | ICD-10-CM | POA: Insufficient documentation

## 2024-02-16 DIAGNOSIS — J069 Acute upper respiratory infection, unspecified: Secondary | ICD-10-CM | POA: Insufficient documentation

## 2024-02-16 DIAGNOSIS — R059 Cough, unspecified: Secondary | ICD-10-CM | POA: Diagnosis not present

## 2024-02-16 MED ORDER — HYDROCODONE BIT-HOMATROP MBR 5-1.5 MG/5ML PO SOLN
5.0000 mL | Freq: Three times a day (TID) | ORAL | 0 refills | Status: AC | PRN
  Filled 2024-02-16: qty 120, 8d supply, fill #0

## 2024-02-16 NOTE — Patient Instructions (Signed)

## 2024-02-16 NOTE — Progress Notes (Signed)
 Subjective:  Patient ID: [name], female    DOB: 03/04/1961  Age: 63 y.o. MRN: [id number]  CC: Cough (Dry cough and it starting to cause her back pain. )   HPI [name] for f/up ----  Discussed the use of AI scribe software for clinical note transcription with the patient, who gave verbal consent to proceed.  History of Present Illness   [name] a 63 year old female who Reed with a persistent dry cough following a recent pneumonia diagnosis.  Two weeks ago, she was diagnosed with pneumonia at an urgent care center. Since then, she has experienced significant improvement in her overall condition but continues to have a persistent dry, hacky cough and a runny nose. Initially, she was coughing up phlegm, but now the cough Reed dry. No current fever, chills, night sweats, chest pain, or shortness of breath. She did experience night sweats at the onset of her illness but not recently. No nausea, vomiting, or hemoptysis.  For her cough, she initially used over-the-counter Delsym, which was ineffective. She then switched to a different formulation of Delsym, which helped her cough up phlegm initially, but now the cough remains dry. She completed a course of antibiotics, though she cannot recall the name of the medication. The persistent coughing causes discomfort in her back. No sore throat or swelling.  She Reed in the process of weaning herself off depression medication and experiences occasional anxiety, which she attributes to a resolved trigger.       Outpatient Medications Prior to Visit  Medication Sig Dispense Refill   ALPRAZolam  (XANAX ) 0.25 MG tablet Take 1 tablet (0.25 mg total) by mouth 3 (three) times daily as needed for anxiety. 270 tablet 0   estradiol  (VIVELLE -DOT) 0.05 MG/24HR patch Place 1 patch onto the skin 2 times a week. (discontinue oral estrogen tablets) 8 patch 1   fluticasone  (FLONASE ) 50 MCG/ACT nasal spray Place 2 sprays into both  nostrils daily. 16 g 1   indapamide  (LOZOL ) 1.25 MG tablet Take 1 tablet (1.25 mg total) by mouth daily. 90 tablet 0   levothyroxine  (SYNTHROID ) 75 MCG tablet Take 1 tablet (75 mcg total) by mouth daily. 90 tablet 0   nebivolol  (BYSTOLIC ) 5 MG tablet Take 1 tablet (5 mg total) by mouth daily. 90 tablet 0   potassium chloride  SA (KLOR-CON  M15) 15 MEQ tablet Take 1 tablet (15 mEq total) by mouth 3 (three) times daily. 270 tablet 1   progesterone  (PROMETRIUM ) 200 MG capsule Take 1 capsule (200 mg total) by mouth at bedtime as directed (Patient taking differently: Take 200 mg by mouth at bedtime. 1-2 capsules at night) 90 capsule 1   progesterone  (PROMETRIUM ) 200 MG capsule Take 1-2 capsules (200-400 mg total) by mouth at bedtime. 60 capsule 2   doxycycline  (VIBRA -TABS) 100 MG tablet Take 1 tablet (100 mg total) by mouth 2 (two) times daily for 7 days 14 tablet 0   escitalopram  (LEXAPRO ) 20 MG tablet Take 1 tablet (20 mg total) by mouth daily. 90 tablet 0   promethazine -dextromethorphan (PROMETHAZINE -DM) 6.25-15 MG/5ML syrup Take 5 mLs by mouth every 4 (four) hours as needed for cough 210 mL 0   No facility-administered medications prior to visit.    ROS Review of Systems  Constitutional:  Negative for appetite change, chills, diaphoresis, fatigue and fever.  HENT: Negative.  Negative for sinus pressure and sore throat.   Eyes: Negative.   Respiratory:  Positive for cough. Negative for  chest tightness, shortness of breath, wheezing and stridor.   Cardiovascular:  Negative for chest pain, palpitations and leg swelling.  Gastrointestinal: Negative.  Negative for abdominal pain, constipation, diarrhea, nausea and vomiting.  Endocrine: Negative.   Genitourinary: Negative.  Negative for difficulty urinating.  Musculoskeletal: Negative.   Skin: Negative.  Negative for color change and rash.  Neurological:  Negative for dizziness, weakness and light-headedness.  Hematological:  Negative for  adenopathy. Does not bruise/bleed easily.  Psychiatric/Behavioral: Negative.  Negative for agitation.     Objective:  BP 138/84 (BP Location: Left Arm, Patient Position: Sitting, Cuff Size: Normal)   Pulse 73   Temp 98.3 F (36.8 C) (Oral)   Resp 16   Ht 5\' 5"  (1.651 m)   Wt 225 lb 3.2 oz (102.2 kg)   LMP 06/18/2015   SpO2 98%   BMI 37.48 kg/m   BP Readings from Last 3 Encounters:  02/16/24 138/84  11/20/23 (!) 142/92  01/26/23 128/88    Wt Readings from Last 3 Encounters:  02/16/24 225 lb 3.2 oz (102.2 kg)  11/20/23 228 lb 9.6 oz (103.7 kg)  01/26/23 218 lb (98.9 kg)    Physical Exam Vitals reviewed.  Constitutional:      Appearance: Normal appearance.  HENT:     Mouth/Throat:     Mouth: Mucous membranes are moist.     Tonsils: No tonsillar exudate or tonsillar abscesses.  Eyes:     General: No scleral icterus.    Conjunctiva/sclera: Conjunctivae normal.  Cardiovascular:     Rate and Rhythm: Normal rate and regular rhythm.     Pulses: Normal pulses.     Heart sounds: No murmur heard.    No friction rub. No gallop.  Pulmonary:     Effort: Pulmonary effort Reed normal.     Breath sounds: No stridor. No wheezing, rhonchi or rales.  Abdominal:     General: Abdomen Reed flat.     Palpations: There Reed no mass.     Tenderness: There Reed no abdominal tenderness. There Reed no guarding.     Hernia: No hernia Reed present.  Musculoskeletal:        General: Normal range of motion.     Cervical back: Neck supple.     Right lower leg: No edema.     Left lower leg: No edema.  Lymphadenopathy:     Cervical: No cervical adenopathy.  Skin:    General: Skin Reed warm and dry.  Neurological:     General: No focal deficit present.     Mental Status: She Reed alert.  Psychiatric:        Mood and Affect: Mood normal.     Lab Results  Component Value Date   WBC 6.0 11/20/2023   HGB 13.6 11/20/2023   HCT 42.8 11/20/2023   PLT 271.0 11/20/2023   GLUCOSE 100 (H) 11/20/2023    CHOL 205 (H) 01/26/2023   TRIG 44.0 01/26/2023   HDL 107.80 01/26/2023   LDLCALC 89 01/26/2023   ALT 19 11/20/2023   AST 21 11/20/2023   NA 138 11/20/2023   K 3.7 11/20/2023   CL 100 11/20/2023   CREATININE 0.73 11/20/2023   BUN 12 11/20/2023   CO2 29 11/20/2023   TSH 0.13 (L) 11/20/2023   INR 1.1 10/16/2019   HGBA1C 5.9 11/20/2023    MM 3D SCREENING MAMMOGRAM BILATERAL BREAST Result Date: 03/17/2023 CLINICAL DATA:  Screening. EXAM: DIGITAL SCREENING BILATERAL MAMMOGRAM WITH TOMOSYNTHESIS AND CAD TECHNIQUE: Bilateral screening digital  craniocaudal and mediolateral oblique mammograms were obtained. Bilateral screening digital breast tomosynthesis was performed. The images were evaluated with computer-aided detection. COMPARISON:  Previous exam(s). ACR Breast Density Category b: There are scattered areas of fibroglandular density. FINDINGS: There are no findings suspicious for malignancy. IMPRESSION: No mammographic evidence of malignancy. A result letter of this screening mammogram will be mailed directly to the patient. RECOMMENDATION: Screening mammogram in one year. (Code:SM-B-01Y) BI-RADS CATEGORY  1: Negative. Electronically Signed   By: Sundra Engel M.D.   On: 03/17/2023 12:44   FINDINGS:  Cardiovascular: Cardiac silhouette and pulmonary vasculature are within normal limits. Mediastinum: Within normal limits. Lungs/pleura: Clear. No pneumothorax or pleural effusion. Upper abdomen: Visualized portions are unremarkable. Chest wall/osseous structures: No acute osseous abnormality. Polyarticular degenerative changes.   IMPRESSION: There Reed no evidence of acute cardiac or pulmonary abnormality. Exam End: 01/22/24 12:06   Specimen Collected: 01/22/24 12:10 Last Resulted: 01/22/24 12:11  Received From: [facility]  Result Received: 01/30/24 14:38   DG Chest 2 View Result Date: 02/16/2024 CLINICAL DATA:  Cough for 3 weeks EXAM: CHEST - 2 VIEW COMPARISON:  December 25, 2019 FINDINGS: The  heart size and mediastinal contours are within normal limits. Both lungs are clear. The visualized skeletal structures are unremarkable. IMPRESSION: No active cardiopulmonary disease. Electronically Signed   By: Fredrich Jefferson M.D.   On: 02/16/2024 11:39     Assessment & Plan:   Subacute cough- CXR Reed negative for infiltrate. -     DG Chest 2 View; Future -     HYDROcodone  Bit-Homatrop MBr; Take 5 mLs by mouth every 8 (eight) hours as needed for up to 8 days for cough.  Dispense: 120 mL; Refill: 0  Viral URI with cough -     HYDROcodone  Bit-Homatrop MBr; Take 5 mLs by mouth every 8 (eight) hours as needed for up to 8 days for cough.  Dispense: 120 mL; Refill: 0  Essential hypertension- BP Reed well controlled.  Acquired hypothyroidism -     Lipid panel; Future -     TSH; Future     Follow-up: No follow-ups on file.  Sandra Crouch, MD

## 2024-02-20 ENCOUNTER — Ambulatory Visit: Admitting: Internal Medicine

## 2024-02-20 DIAGNOSIS — Z79890 Hormone replacement therapy (postmenopausal): Secondary | ICD-10-CM | POA: Diagnosis not present

## 2024-02-20 DIAGNOSIS — Z6836 Body mass index (BMI) 36.0-36.9, adult: Secondary | ICD-10-CM | POA: Diagnosis not present

## 2024-02-20 DIAGNOSIS — N951 Menopausal and female climacteric states: Secondary | ICD-10-CM | POA: Diagnosis not present

## 2024-02-20 DIAGNOSIS — E559 Vitamin D deficiency, unspecified: Secondary | ICD-10-CM | POA: Diagnosis not present

## 2024-02-20 DIAGNOSIS — E039 Hypothyroidism, unspecified: Secondary | ICD-10-CM | POA: Diagnosis not present

## 2024-02-20 DIAGNOSIS — R5383 Other fatigue: Secondary | ICD-10-CM | POA: Diagnosis not present

## 2024-02-26 ENCOUNTER — Other Ambulatory Visit: Payer: Self-pay | Admitting: Internal Medicine

## 2024-02-26 DIAGNOSIS — I10 Essential (primary) hypertension: Secondary | ICD-10-CM

## 2024-02-27 ENCOUNTER — Other Ambulatory Visit (HOSPITAL_COMMUNITY): Payer: Self-pay

## 2024-02-27 DIAGNOSIS — I10 Essential (primary) hypertension: Secondary | ICD-10-CM | POA: Diagnosis not present

## 2024-02-27 DIAGNOSIS — Z6837 Body mass index (BMI) 37.0-37.9, adult: Secondary | ICD-10-CM | POA: Diagnosis not present

## 2024-02-27 MED ORDER — NEBIVOLOL HCL 5 MG PO TABS
5.0000 mg | ORAL_TABLET | Freq: Every day | ORAL | 0 refills | Status: DC
  Filled 2024-02-27: qty 90, 90d supply, fill #0

## 2024-03-01 ENCOUNTER — Other Ambulatory Visit (HOSPITAL_COMMUNITY): Payer: Self-pay

## 2024-03-05 DIAGNOSIS — Z6837 Body mass index (BMI) 37.0-37.9, adult: Secondary | ICD-10-CM | POA: Diagnosis not present

## 2024-03-05 DIAGNOSIS — R5383 Other fatigue: Secondary | ICD-10-CM | POA: Diagnosis not present

## 2024-03-07 ENCOUNTER — Other Ambulatory Visit (HOSPITAL_COMMUNITY): Payer: Self-pay

## 2024-03-12 DIAGNOSIS — E039 Hypothyroidism, unspecified: Secondary | ICD-10-CM | POA: Diagnosis not present

## 2024-03-12 DIAGNOSIS — N951 Menopausal and female climacteric states: Secondary | ICD-10-CM | POA: Diagnosis not present

## 2024-03-12 DIAGNOSIS — R7989 Other specified abnormal findings of blood chemistry: Secondary | ICD-10-CM | POA: Diagnosis not present

## 2024-03-12 DIAGNOSIS — R5383 Other fatigue: Secondary | ICD-10-CM | POA: Diagnosis not present

## 2024-03-12 DIAGNOSIS — E876 Hypokalemia: Secondary | ICD-10-CM | POA: Diagnosis not present

## 2024-03-12 DIAGNOSIS — Z6837 Body mass index (BMI) 37.0-37.9, adult: Secondary | ICD-10-CM | POA: Diagnosis not present

## 2024-03-19 DIAGNOSIS — N951 Menopausal and female climacteric states: Secondary | ICD-10-CM | POA: Diagnosis not present

## 2024-03-19 DIAGNOSIS — E039 Hypothyroidism, unspecified: Secondary | ICD-10-CM | POA: Diagnosis not present

## 2024-03-21 DIAGNOSIS — Z6837 Body mass index (BMI) 37.0-37.9, adult: Secondary | ICD-10-CM | POA: Diagnosis not present

## 2024-03-21 DIAGNOSIS — E039 Hypothyroidism, unspecified: Secondary | ICD-10-CM | POA: Diagnosis not present

## 2024-03-21 DIAGNOSIS — E559 Vitamin D deficiency, unspecified: Secondary | ICD-10-CM | POA: Diagnosis not present

## 2024-03-21 DIAGNOSIS — N951 Menopausal and female climacteric states: Secondary | ICD-10-CM | POA: Diagnosis not present

## 2024-03-26 DIAGNOSIS — Z6837 Body mass index (BMI) 37.0-37.9, adult: Secondary | ICD-10-CM | POA: Diagnosis not present

## 2024-03-26 DIAGNOSIS — E559 Vitamin D deficiency, unspecified: Secondary | ICD-10-CM | POA: Diagnosis not present

## 2024-04-01 ENCOUNTER — Other Ambulatory Visit: Payer: Self-pay

## 2024-04-01 ENCOUNTER — Other Ambulatory Visit (HOSPITAL_COMMUNITY): Payer: Self-pay

## 2024-04-02 DIAGNOSIS — E039 Hypothyroidism, unspecified: Secondary | ICD-10-CM | POA: Diagnosis not present

## 2024-04-02 DIAGNOSIS — Z6837 Body mass index (BMI) 37.0-37.9, adult: Secondary | ICD-10-CM | POA: Diagnosis not present

## 2024-04-09 DIAGNOSIS — Z6837 Body mass index (BMI) 37.0-37.9, adult: Secondary | ICD-10-CM | POA: Diagnosis not present

## 2024-04-09 DIAGNOSIS — E039 Hypothyroidism, unspecified: Secondary | ICD-10-CM | POA: Diagnosis not present

## 2024-04-26 ENCOUNTER — Other Ambulatory Visit (HOSPITAL_COMMUNITY): Payer: Self-pay

## 2024-05-08 ENCOUNTER — Other Ambulatory Visit: Payer: Self-pay | Admitting: Internal Medicine

## 2024-05-08 ENCOUNTER — Other Ambulatory Visit (HOSPITAL_COMMUNITY): Payer: Self-pay

## 2024-05-08 ENCOUNTER — Other Ambulatory Visit: Payer: Self-pay

## 2024-05-08 DIAGNOSIS — E039 Hypothyroidism, unspecified: Secondary | ICD-10-CM

## 2024-05-08 DIAGNOSIS — I10 Essential (primary) hypertension: Secondary | ICD-10-CM

## 2024-05-09 ENCOUNTER — Other Ambulatory Visit (HOSPITAL_COMMUNITY): Payer: Self-pay

## 2024-05-09 MED ORDER — LEVOTHYROXINE SODIUM 75 MCG PO TABS
75.0000 ug | ORAL_TABLET | Freq: Every day | ORAL | 0 refills | Status: DC
  Filled 2024-05-09: qty 90, 90d supply, fill #0

## 2024-05-09 MED ORDER — INDAPAMIDE 1.25 MG PO TABS
1.2500 mg | ORAL_TABLET | Freq: Every day | ORAL | 0 refills | Status: DC
  Filled 2024-05-09: qty 90, 90d supply, fill #0

## 2024-05-16 ENCOUNTER — Ambulatory Visit: Payer: Self-pay

## 2024-05-16 ENCOUNTER — Encounter: Payer: Self-pay | Admitting: Internal Medicine

## 2024-05-16 ENCOUNTER — Ambulatory Visit: Admitting: Internal Medicine

## 2024-05-16 ENCOUNTER — Other Ambulatory Visit (HOSPITAL_COMMUNITY): Payer: Self-pay

## 2024-05-16 VITALS — BP 122/84 | HR 82 | Temp 98.4°F | Ht 65.0 in | Wt 228.4 lb

## 2024-05-16 DIAGNOSIS — E559 Vitamin D deficiency, unspecified: Secondary | ICD-10-CM | POA: Diagnosis not present

## 2024-05-16 DIAGNOSIS — R300 Dysuria: Secondary | ICD-10-CM | POA: Insufficient documentation

## 2024-05-16 DIAGNOSIS — I10 Essential (primary) hypertension: Secondary | ICD-10-CM | POA: Diagnosis not present

## 2024-05-16 MED ORDER — CEPHALEXIN 500 MG PO CAPS
500.0000 mg | ORAL_CAPSULE | Freq: Three times a day (TID) | ORAL | 0 refills | Status: DC
  Filled 2024-05-16: qty 30, 10d supply, fill #0

## 2024-05-16 NOTE — Assessment & Plan Note (Signed)
 Pt likely with uncomplicated acute cystitis - for cephalexin  course after ua and culture,  to f/u any worsening symptoms or concerns

## 2024-05-16 NOTE — Assessment & Plan Note (Signed)
 BP Readings from Last 3 Encounters:  05/16/24 122/84  02/16/24 138/84  11/20/23 (!) 142/92   Stable, pt to continue medical treatment lozol  1.25 every day, bystolic  5 qd

## 2024-05-16 NOTE — Telephone Encounter (Signed)
FYI-patient scheduled for today

## 2024-05-16 NOTE — Assessment & Plan Note (Signed)
 Last vitamin D  Lab Results  Component Value Date   VD25OH 24.95 (L) 01/26/2023   Low, to start oral replacement

## 2024-05-16 NOTE — Progress Notes (Signed)
 Patient ID: [name], female   DOB: [birth year], 63 y.o.   MRN: [id number]        Chief Complaint: follow up 3 days onset dysuria and frquency       HPI:  [name] is a 63 y.o. female here with above,  Denies urinary symptoms such as urgency, flank pain, hematuria or n/v, fever, chills.  Denies even any prior UTI.  No worsening incontinence.  Pt denies chest pain, increased sob or doe, wheezing, orthopnea, PND, increased LE swelling, palpitations, dizziness or syncope.   Pt denies polydipsia, polyuria, or new focal neuro s/s.    Pt denies fever, wt loss, night sweats, loss of appetite, or other constitutional symptoms         Wt Readings from Last 3 Encounters:  05/16/24 228 lb 6.4 oz (103.6 kg)  02/16/24 225 lb 3.2 oz (102.2 kg)  11/20/23 228 lb 9.6 oz (103.7 kg)   BP Readings from Last 3 Encounters:  05/16/24 122/84  02/16/24 138/84  11/20/23 (!) 142/92         Past Medical History:  Diagnosis Date   Anxiety state, unspecified    DEPRESSION    Hypertension    Obesity (BMI 35.0-39.9 without comorbidity) 05/16/2019   PCOS (polycystic ovarian syndrome) 05/16/2019   Sinus tachycardia 09/10/2015   Tear of MCL (medial collateral ligament) of knee 11/2012   left knee   Vitamin D  deficiency disease 05/16/2019   Past Surgical History:  Procedure Laterality Date   COLONOSCOPY  2015   DILATATION & CURRETTAGE/HYSTEROSCOPY WITH RESECTOCOPE  08-11-2004   removal polyps   HAMMER TOE SURGERY Bilateral 03-22-2006   fifth toe   KNEE ARTHROSCOPY WITH MEDIAL COLLATERAL LIGAMENT RECONSTRUCTION Left 12/18/2012   Procedure: KNEE ARTHROSCOPY WITH OPEN MEDIAL COLLATERAL LIGAMENT RECONSTRUCTION WITH ALLOGRAFT,ANTERIOR CRUCIATE LIGAMENT RESCONSTRUCTION WITH ALLOGRAFT, PARTIAL MEDIALMENISECTOMY/DEBRIDEMENT CHONDROPLASTY;  Surgeon: Lamar Collet, MD;  Location: [facility];  Service: Orthopedics;  Laterality: Left;    reports that she quit smoking about 28 years ago. Her smoking  use included cigarettes. She started smoking about 34 years ago. She has a 1.5 pack-year smoking history. She has never used smokeless tobacco. She reports current alcohol use. She reports that she does not use drugs. family history includes Diabetes in her maternal grandmother; Hypertension in her father and mother; Kidney cancer in her mother. No Known Allergies Current Outpatient Medications on File Prior to Visit  Medication Sig Dispense Refill   ALPRAZolam  (XANAX ) 0.25 MG tablet Take 1 tablet (0.25 mg total) by mouth 3 (three) times daily as needed for anxiety. 270 tablet 0   estradiol  (VIVELLE -DOT) 0.05 MG/24HR patch Place 1 patch onto the skin 2 times a week. (discontinue oral estrogen tablets) 8 patch 1   fluticasone  (FLONASE ) 50 MCG/ACT nasal spray Place 2 sprays into both nostrils daily. 16 g 1   indapamide  (LOZOL ) 1.25 MG tablet Take 1 tablet (1.25 mg total) by mouth daily. 90 tablet 0   levothyroxine  (SYNTHROID ) 75 MCG tablet Take 1 tablet (75 mcg total) by mouth daily. 90 tablet 0   nebivolol  (BYSTOLIC ) 5 MG tablet Take 1 tablet (5 mg total) by mouth daily. 90 tablet 0   potassium chloride  SA (KLOR-CON  M15) 15 MEQ tablet Take 1 tablet (15 mEq total) by mouth 3 (three) times daily. 270 tablet 1   progesterone  (PROMETRIUM ) 200 MG capsule Take 1 capsule (200 mg total) by mouth at bedtime as directed (Patient taking differently: Take 200 mg by mouth at  bedtime. 1-2 capsules at night) 90 capsule 1   progesterone  (PROMETRIUM ) 200 MG capsule Take 1-2 capsules (200-400 mg total) by mouth at bedtime. 60 capsule 2   No current facility-administered medications on file prior to visit.        ROS:  All others reviewed and negative.  Objective        PE:  BP 122/84   Pulse 82   Temp 98.4 F (36.9 C)   Ht 5' 5 (1.651 m)   Wt 228 lb 6.4 oz (103.6 kg)   LMP 06/18/2015   SpO2 99%   BMI 38.01 kg/m                 Constitutional: Pt appears in NAD               HENT: Head: NCAT.                 Right Ear: External ear normal.                 Left Ear: External ear normal.                Eyes: . Pupils are equal, round, and reactive to light. Conjunctivae and EOM are normal               Nose: without d/c or deformity               Neck: Neck supple. Gross normal ROM               Cardiovascular: Normal rate and regular rhythm.                 Pulmonary/Chest: Effort normal and breath sounds without rales or wheezing.                Abd:  Soft, mild low mid abd tender, ND, + BS, no organomegaly               Neurological: Pt is alert. At baseline orientation, motor grossly intact               Skin: Skin is warm. No rashes, no other new lesions, LE edema - none               Psychiatric: Pt behavior is normal without agitation   Micro: none  Cardiac tracings I have personally interpreted today:  none  Pertinent Radiological findings (summarize): none   Lab Results  Component Value Date   WBC 6.0 11/20/2023   HGB 13.6 11/20/2023   HCT 42.8 11/20/2023   PLT 271.0 11/20/2023   GLUCOSE 100 (H) 11/20/2023   CHOL 205 (H) 01/26/2023   TRIG 44.0 01/26/2023   HDL 107.80 01/26/2023   LDLCALC 89 01/26/2023   ALT 19 11/20/2023   AST 21 11/20/2023   NA 138 11/20/2023   K 3.7 11/20/2023   CL 100 11/20/2023   CREATININE 0.73 11/20/2023   BUN 12 11/20/2023   CO2 29 11/20/2023   TSH 0.13 (L) 11/20/2023   INR 1.1 10/16/2019   HGBA1C 5.9 11/20/2023   Assessment/Plan:  [name] is a 63 y.o. Black or African American [2] female with  has a past medical history of Anxiety state, unspecified, DEPRESSION, Hypertension, Obesity (BMI 35.0-39.9 without comorbidity) (05/16/2019), PCOS (polycystic ovarian syndrome) (05/16/2019), Sinus tachycardia (09/10/2015), Tear of MCL (medial collateral ligament) of knee (11/2012), and Vitamin D  deficiency disease (05/16/2019).  Dysuria Pt likely with uncomplicated acute cystitis -  for cephalexin  course after ua and culture,  to f/u any worsening  symptoms or concerns  Essential hypertension BP Readings from Last 3 Encounters:  05/16/24 122/84  02/16/24 138/84  11/20/23 (!) 142/92   Stable, pt to continue medical treatment lozol  1.25 every day, bystolic  5 qd   Vitamin D  deficiency disease Last vitamin D  Lab Results  Component Value Date   VD25OH 24.95 (L) 01/26/2023   Low, to start oral replacement  Followup: Return if symptoms worsen or fail to improve.  Lynwood Rush, MD 05/16/2024 8:36 PM [facility]

## 2024-05-16 NOTE — Telephone Encounter (Signed)
 FYI Only or Action Required?: FYI only for provider.  Patient was last seen in primary care on 02/16/2024 by Joshua Debby CROME, MD.  Called Nurse Triage reporting Dysuria.  Symptoms began x 2 weeks.  Interventions attempted: Nothing.  Symptoms are: gradually worsening.  Triage Disposition: See Physician Within 24 Hours  Patient/caregiver understands and will follow disposition?: Yes    Copied from CRM [id number]. Topic: Clinical - Red Word Triage >> May 16, 2024  9:55 AM Jayma L wrote: Red Word that prompted transfer to Nurse Triage: patient is having a possible UTI , burning with urination and she said its painful at the end when she urinates .SABRA Been going on past 2 weeks Reason for Disposition  Unusual vaginal discharge (e.g., bad smelling, yellow, green, or foamy-white)  Answer Assessment - Initial Assessment Questions 1. SEVERITY: How bad is the pain?  (e.g., Scale 1-10; mild, moderate, or severe)     6 or 7/10 while urinating 2. FREQUENCY: How many times have you had painful urination today?      Each time 3. PATTERN: Is pain present every time you urinate or just sometimes?      constant 4. ONSET: When did the painful urination start?      X 2 weeks 5. FEVER: Do you have a fever? If Yes, ask: What is your temperature, how was it measured, and when did it start?     no 6. PAST UTI: Have you had a urine infection before? If Yes, ask: When was the last time? and What happened that time?      na 7. CAUSE: What do you think is causing the painful urination?  (e.g., UTI, scratch, Herpes sore)     uti 8. OTHER SYMPTOMS: Do you have any other symptoms? (e.g., blood in urine, flank pain, genital sores, urgency, vaginal discharge)     Frequency, urgency 9. PREGNANCY: Is there any chance you are pregnant? When was your last menstrual period?     no  Protocols used: Urination Pain - Female-A-AH

## 2024-05-16 NOTE — Patient Instructions (Signed)
 Please take all new medication as prescribed- the antibiotic  Please continue all other medications as before, and refills have been done if requested.  Please have the pharmacy call with any other refills you may need.  Please keep your appointments with your specialists as you may have planned  Please go to the LAB at the blood drawing area for the tests to be done  - just the urine testing today  You will be contacted by phone if any changes need to be made immediately.  Otherwise, you will receive a letter about your results with an explanation, but please check with MyChart first.

## 2024-05-17 ENCOUNTER — Ambulatory Visit: Payer: Self-pay | Admitting: Internal Medicine

## 2024-05-17 DIAGNOSIS — R300 Dysuria: Secondary | ICD-10-CM | POA: Diagnosis not present

## 2024-05-17 LAB — URINALYSIS, ROUTINE W REFLEX MICROSCOPIC
Bilirubin Urine: NEGATIVE
Hgb urine dipstick: NEGATIVE
Ketones, ur: NEGATIVE
Nitrite: NEGATIVE
Specific Gravity, Urine: 1.015 (ref 1.000–1.030)
Total Protein, Urine: NEGATIVE
Urine Glucose: NEGATIVE
Urobilinogen, UA: 2 — AB (ref 0.0–1.0)
pH: 8 (ref 5.0–8.0)

## 2024-05-18 LAB — URINE CULTURE: Result:: NO GROWTH

## 2024-05-24 ENCOUNTER — Other Ambulatory Visit (HOSPITAL_COMMUNITY): Payer: Self-pay

## 2024-05-24 ENCOUNTER — Other Ambulatory Visit: Payer: Self-pay

## 2024-05-24 ENCOUNTER — Other Ambulatory Visit: Payer: Self-pay | Admitting: Internal Medicine

## 2024-05-24 DIAGNOSIS — I10 Essential (primary) hypertension: Secondary | ICD-10-CM

## 2024-05-24 MED ORDER — NEBIVOLOL HCL 5 MG PO TABS
5.0000 mg | ORAL_TABLET | Freq: Every day | ORAL | 0 refills | Status: DC
  Filled 2024-05-24: qty 90, 90d supply, fill #0

## 2024-05-30 ENCOUNTER — Other Ambulatory Visit (HOSPITAL_COMMUNITY): Payer: Self-pay

## 2024-05-30 ENCOUNTER — Other Ambulatory Visit: Payer: Self-pay | Admitting: Family

## 2024-05-31 ENCOUNTER — Other Ambulatory Visit (HOSPITAL_COMMUNITY): Payer: Self-pay

## 2024-06-06 ENCOUNTER — Other Ambulatory Visit (HOSPITAL_COMMUNITY): Payer: Self-pay

## 2024-06-06 ENCOUNTER — Other Ambulatory Visit: Payer: Self-pay | Admitting: Family

## 2024-06-11 ENCOUNTER — Other Ambulatory Visit (HOSPITAL_COMMUNITY): Payer: Self-pay

## 2024-06-13 ENCOUNTER — Other Ambulatory Visit: Payer: Self-pay | Admitting: Family

## 2024-06-13 ENCOUNTER — Other Ambulatory Visit (HOSPITAL_COMMUNITY): Payer: Self-pay

## 2024-06-18 ENCOUNTER — Encounter: Payer: Self-pay | Admitting: Internal Medicine

## 2024-06-18 ENCOUNTER — Ambulatory Visit: Payer: Self-pay | Admitting: Internal Medicine

## 2024-06-18 ENCOUNTER — Ambulatory Visit (INDEPENDENT_AMBULATORY_CARE_PROVIDER_SITE_OTHER): Admitting: Internal Medicine

## 2024-06-18 ENCOUNTER — Other Ambulatory Visit (HOSPITAL_COMMUNITY): Payer: Self-pay

## 2024-06-18 ENCOUNTER — Other Ambulatory Visit: Payer: Self-pay

## 2024-06-18 VITALS — BP 144/76 | HR 96 | Temp 98.3°F | Resp 16 | Ht 65.0 in | Wt 231.2 lb

## 2024-06-18 DIAGNOSIS — Z0001 Encounter for general adult medical examination with abnormal findings: Secondary | ICD-10-CM

## 2024-06-18 DIAGNOSIS — Z0000 Encounter for general adult medical examination without abnormal findings: Secondary | ICD-10-CM

## 2024-06-18 DIAGNOSIS — Z23 Encounter for immunization: Secondary | ICD-10-CM | POA: Diagnosis not present

## 2024-06-18 DIAGNOSIS — I10 Essential (primary) hypertension: Secondary | ICD-10-CM | POA: Diagnosis not present

## 2024-06-18 DIAGNOSIS — E039 Hypothyroidism, unspecified: Secondary | ICD-10-CM | POA: Diagnosis not present

## 2024-06-18 DIAGNOSIS — E669 Obesity, unspecified: Secondary | ICD-10-CM | POA: Diagnosis not present

## 2024-06-18 DIAGNOSIS — E876 Hypokalemia: Secondary | ICD-10-CM

## 2024-06-18 DIAGNOSIS — F5104 Psychophysiologic insomnia: Secondary | ICD-10-CM

## 2024-06-18 LAB — LIPID PANEL
Cholesterol: 166 mg/dL (ref 0–200)
HDL: 87.9 mg/dL (ref >39.00)
LDL Cholesterol: 65 mg/dL (ref 0–99)
NonHDL: 77.62
Total CHOL/HDL Ratio: 2
Triglycerides: 64 mg/dL (ref 0.0–149.0)
VLDL: 12.8 mg/dL (ref 0.0–40.0)

## 2024-06-18 LAB — CBC WITH DIFFERENTIAL/PLATELET
Basophils Absolute: 0.1 K/uL (ref 0.0–0.1)
Basophils Relative: 1 % (ref 0.0–3.0)
Eosinophils Absolute: 0 K/uL (ref 0.0–0.7)
Eosinophils Relative: 0.8 % (ref 0.0–5.0)
HCT: 45.2 % (ref 36.0–46.0)
Hemoglobin: 14.4 g/dL (ref 12.0–15.0)
Lymphocytes Relative: 39.7 % (ref 12.0–46.0)
Lymphs Abs: 2.3 K/uL (ref 0.7–4.0)
MCHC: 31.9 g/dL (ref 30.0–36.0)
MCV: 89.8 fl (ref 78.0–100.0)
Monocytes Absolute: 0.7 K/uL (ref 0.1–1.0)
Monocytes Relative: 12.7 % — ABNORMAL HIGH (ref 3.0–12.0)
Neutro Abs: 2.7 K/uL (ref 1.4–7.7)
Neutrophils Relative %: 45.8 % (ref 43.0–77.0)
Platelets: 247 K/uL (ref 150.0–400.0)
RBC: 5.03 Mil/uL (ref 3.87–5.11)
RDW: 16.1 % — ABNORMAL HIGH (ref 11.5–15.5)
WBC: 5.8 K/uL (ref 4.0–10.5)

## 2024-06-18 LAB — HEMOGLOBIN A1C: Hgb A1c MFr Bld: 6 % (ref 4.6–6.5)

## 2024-06-18 LAB — BASIC METABOLIC PANEL WITH GFR
BUN: 10 mg/dL (ref 6–23)
CO2: 29 meq/L (ref 19–32)
Calcium: 10.4 mg/dL (ref 8.4–10.5)
Chloride: 98 meq/L (ref 96–112)
Creatinine, Ser: 0.73 mg/dL (ref 0.40–1.20)
GFR: 87.84 mL/min (ref >60.00)
Glucose, Bld: 105 mg/dL — ABNORMAL HIGH (ref 70–99)
Potassium: 3.3 meq/L — ABNORMAL LOW (ref 3.5–5.1)
Sodium: 138 meq/L (ref 135–145)

## 2024-06-18 LAB — TSH: TSH: 0.2 u[IU]/mL — ABNORMAL LOW (ref 0.35–5.50)

## 2024-06-18 MED ORDER — LEVOTHYROXINE SODIUM 50 MCG PO TABS
50.0000 ug | ORAL_TABLET | Freq: Every day | ORAL | 0 refills | Status: AC
  Filled 2024-06-18: qty 90, 90d supply, fill #0

## 2024-06-18 MED ORDER — COVID-19 MRNA VAC-TRIS(PFIZER) 30 MCG/0.3ML IM SUSY
0.3000 mL | PREFILLED_SYRINGE | Freq: Once | INTRAMUSCULAR | 0 refills | Status: AC
  Filled 2024-06-18: qty 0.3, 1d supply, fill #0

## 2024-06-18 MED ORDER — POTASSIUM CHLORIDE CRYS ER 15 MEQ PO TBCR
15.0000 meq | EXTENDED_RELEASE_TABLET | Freq: Three times a day (TID) | ORAL | 1 refills | Status: AC
  Filled 2024-06-18 – 2024-10-08 (×2): qty 270, 90d supply, fill #0

## 2024-06-18 MED ORDER — ESZOPICLONE 3 MG PO TABS
3.0000 mg | ORAL_TABLET | Freq: Every day | ORAL | 0 refills | Status: AC
  Filled 2024-06-18: qty 90, 90d supply, fill #0

## 2024-06-18 NOTE — Progress Notes (Signed)
 "  Subjective:  Patient ID: Alain KANDICE Riding, female    DOB: Jul 11, 1961  Age: 63 y.o. MRN: [id number]  CC: Hypertension, Annual Exam, and Hyperlipidemia   HPI HATSUE SIME presents for a CPX and f/up ----  Discussed the use of AI scribe software for clinical note transcription with the patient, who gave verbal consent to proceed.  History of Present Illness TAKYA VANDIVIER is a 63 year old female who presents for follow-up and vaccination updates.  She feels more active recently and denies chest pain, shortness of breath, dizziness, or lightheadedness during physical activity. No symptoms of high or low blood pressure are reported, and her blood sugar levels have been stable, although she notes weight gain.  She recently had an upper respiratory infection but has not received the most recent COVID vaccine. She received a flu shot at work.  She experiences difficulty staying asleep, although she can fall asleep without issue. She has tried Tylenol  PM and melatonin gummies without success.     Outpatient Medications Prior to Visit  Medication Sig Dispense Refill   ALPRAZolam  (XANAX ) 0.25 MG tablet Take 1 tablet (0.25 mg total) by mouth 3 (three) times daily as needed for anxiety. 270 tablet 0   estradiol  (VIVELLE -DOT) 0.05 MG/24HR patch Place 1 patch onto the skin 2 times a week. (discontinue oral estrogen tablets) 8 patch 1   fluticasone  (FLONASE ) 50 MCG/ACT nasal spray Place 2 sprays into both nostrils daily. 16 g 1   indapamide  (LOZOL ) 1.25 MG tablet Take 1 tablet (1.25 mg total) by mouth daily. 90 tablet 0   nebivolol  (BYSTOLIC ) 5 MG tablet Take 1 tablet (5 mg total) by mouth daily. 90 tablet 0   progesterone  (PROMETRIUM ) 200 MG capsule Take 1 capsule (200 mg total) by mouth at bedtime as directed (Patient taking differently: Take 200 mg by mouth at bedtime. 1-2 capsules at night) 90 capsule 1   progesterone  (PROMETRIUM ) 200 MG capsule Take 1-2 capsules (200-400 mg total) by  mouth at bedtime. 60 capsule 2   cephALEXin  (KEFLEX ) 500 MG capsule Take 1 capsule (500 mg total) by mouth 3 (three) times daily. 30 capsule 0   levothyroxine  (SYNTHROID ) 75 MCG tablet Take 1 tablet (75 mcg total) by mouth daily. 90 tablet 0   potassium chloride  SA (KLOR-CON  M15) 15 MEQ tablet Take 1 tablet (15 mEq total) by mouth 3 (three) times daily. 270 tablet 1   No facility-administered medications prior to visit.    ROS Review of Systems  Constitutional:  Positive for unexpected weight change (wt gain). Negative for appetite change, chills, diaphoresis and fatigue.  HENT: Negative.    Eyes: Negative.   Respiratory: Negative.  Negative for cough, chest tightness, shortness of breath and wheezing.   Cardiovascular:  Negative for chest pain, palpitations and leg swelling.  Gastrointestinal: Negative.  Negative for abdominal pain, constipation, diarrhea, nausea and vomiting.  Endocrine: Negative.   Genitourinary: Negative.  Negative for difficulty urinating.  Musculoskeletal: Negative.  Negative for arthralgias and myalgias.  Skin: Negative.   Neurological: Negative.  Negative for dizziness and weakness.  Hematological:  Negative for adenopathy. Does not bruise/bleed easily.  Psychiatric/Behavioral:  Positive for sleep disturbance. Negative for confusion and decreased concentration. The patient is not nervous/anxious.     Objective:  BP (!) 144/76 (BP Location: Left Arm, Patient Position: Sitting, Cuff Size: Normal)   Pulse 96   Temp 98.3 F (36.8 C) (Oral)   Resp 16   Ht 5' 5 (1.651 m)  Wt 231 lb 3.2 oz (104.9 kg)   LMP 06/18/2015   SpO2 98%   BMI 38.47 kg/m   BP Readings from Last 3 Encounters:  06/18/24 (!) 144/76  05/16/24 122/84  02/16/24 138/84    Wt Readings from Last 3 Encounters:  06/18/24 231 lb 3.2 oz (104.9 kg)  05/16/24 228 lb 6.4 oz (103.6 kg)  02/16/24 225 lb 3.2 oz (102.2 kg)    Physical Exam Vitals reviewed.  Constitutional:      Appearance:  Normal appearance.  HENT:     Mouth/Throat:     Mouth: Mucous membranes are moist.  Eyes:     General: No scleral icterus.    Conjunctiva/sclera: Conjunctivae normal.  Cardiovascular:     Rate and Rhythm: Normal rate and regular rhythm.     Heart sounds: No murmur heard.    No friction rub. No gallop.  Pulmonary:     Effort: Pulmonary effort is normal.     Breath sounds: No stridor. No wheezing, rhonchi or rales.  Abdominal:     General: Abdomen is flat.     Palpations: There is no mass.     Tenderness: There is no abdominal tenderness. There is no guarding.     Hernia: No hernia is present.  Musculoskeletal:        General: Normal range of motion.     Cervical back: Neck supple.     Right lower leg: No edema.     Left lower leg: No edema.  Lymphadenopathy:     Cervical: No cervical adenopathy.  Skin:    General: Skin is warm and dry.  Neurological:     General: No focal deficit present.     Mental Status: She is alert. Mental status is at baseline.  Psychiatric:        Mood and Affect: Mood normal.        Behavior: Behavior normal.     Lab Results  Component Value Date   WBC 5.8 06/18/2024   HGB 14.4 06/18/2024   HCT 45.2 06/18/2024   PLT 247.0 06/18/2024   GLUCOSE 105 (H) 06/18/2024   CHOL 166 06/18/2024   TRIG 64.0 06/18/2024   HDL 87.90 06/18/2024   LDLCALC 65 06/18/2024   ALT 19 11/20/2023   AST 21 11/20/2023   NA 138 06/18/2024   K 3.3 (L) 06/18/2024   CL 98 06/18/2024   CREATININE 0.73 06/18/2024   BUN 10 06/18/2024   CO2 29 06/18/2024   TSH 0.20 (L) 06/18/2024   INR 1.1 10/16/2019   HGBA1C 6.0 06/18/2024    MM 3D SCREENING MAMMOGRAM BILATERAL BREAST Result Date: 03/17/2023 CLINICAL DATA:  Screening. EXAM: DIGITAL SCREENING BILATERAL MAMMOGRAM WITH TOMOSYNTHESIS AND CAD TECHNIQUE: Bilateral screening digital craniocaudal and mediolateral oblique mammograms were obtained. Bilateral screening digital breast tomosynthesis was performed. The images were  evaluated with computer-aided detection. COMPARISON:  Previous exam(s). ACR Breast Density Category b: There are scattered areas of fibroglandular density. FINDINGS: There are no findings suspicious for malignancy. IMPRESSION: No mammographic evidence of malignancy. A result letter of this screening mammogram will be mailed directly to the patient. RECOMMENDATION: Screening mammogram in one year. (Code:SM-B-01Y) BI-RADS CATEGORY  1: Negative. Electronically Signed   By: Reyes Phi M.D.   On: 03/17/2023 12:44    Assessment & Plan:   Essential hypertension- BP is not at goal. She agrees to improve her lifestyle modifications. -     CBC with Differential/Platelet; Future -     TSH; Future -  Basic metabolic panel with GFR; Future -     Potassium Chloride  Crys ER; Take 1 tablet (15 mEq total) by mouth 3 (three) times daily.  Dispense: 270 tablet; Refill: 1  Acquired hypothyroidism- TSH is suppressed. Will lower the T4 dose. -     TSH; Future -     Levothyroxine  Sodium; Take 1 tablet (50 mcg total) by mouth daily.  Dispense: 90 tablet; Refill: 0  Obesity (BMI 35.0-39.9 without comorbidity) -     TSH; Future -     Hemoglobin A1c; Future  Psychophysiological insomnia -     Eszopiclone ; Take 1 tablet (3 mg total) by mouth at bedtime. Take immediately before bedtime  Dispense: 90 tablet; Refill: 0  Encounter for general adult medical examination with abnormal findings- Exam completed, labs reviewed, vaccines reviewed and updated, cancer screenings are UTD, pt ed material was given.  -     Lipid panel; Future -     COVID-19 mRNA Vac-TriS(Pfizer); Inject 0.3 mLs into the muscle once for 1 dose.  Dispense: 0.3 mL; Refill: 0  Immunization due -     Pneumococcal conjugate vaccine 20-valent  Hypokalemia -     Potassium Chloride  Crys ER; Take 1 tablet (15 mEq total) by mouth 3 (three) times daily.  Dispense: 270 tablet; Refill: 1     Follow-up: Return in about 3 months (around  09/18/2024).  Debby Molt, MD "

## 2024-06-18 NOTE — Patient Instructions (Signed)

## 2024-06-19 ENCOUNTER — Other Ambulatory Visit (HOSPITAL_COMMUNITY): Payer: Self-pay

## 2024-06-19 ENCOUNTER — Other Ambulatory Visit: Payer: Self-pay

## 2024-06-24 ENCOUNTER — Other Ambulatory Visit (HOSPITAL_COMMUNITY): Payer: Self-pay

## 2024-06-28 ENCOUNTER — Other Ambulatory Visit (HOSPITAL_COMMUNITY): Payer: Self-pay

## 2024-07-08 ENCOUNTER — Other Ambulatory Visit (HOSPITAL_COMMUNITY): Payer: Self-pay

## 2024-08-14 ENCOUNTER — Other Ambulatory Visit: Payer: Self-pay | Admitting: Internal Medicine

## 2024-08-14 ENCOUNTER — Other Ambulatory Visit: Payer: Self-pay | Admitting: Family

## 2024-08-14 ENCOUNTER — Other Ambulatory Visit: Payer: Self-pay

## 2024-08-14 ENCOUNTER — Other Ambulatory Visit (HOSPITAL_COMMUNITY): Payer: Self-pay

## 2024-08-14 DIAGNOSIS — I10 Essential (primary) hypertension: Secondary | ICD-10-CM

## 2024-08-14 MED ORDER — INDAPAMIDE 1.25 MG PO TABS
1.2500 mg | ORAL_TABLET | Freq: Every day | ORAL | 0 refills | Status: AC
  Filled 2024-08-14: qty 90, 90d supply, fill #0

## 2024-08-17 ENCOUNTER — Other Ambulatory Visit (HOSPITAL_COMMUNITY): Payer: Self-pay

## 2024-08-17 MED ORDER — ESTRADIOL 0.05 MG/24HR TD PTTW
1.0000 | MEDICATED_PATCH | TRANSDERMAL | 1 refills | Status: AC
  Filled 2024-08-17: qty 8, 28d supply, fill #0

## 2024-09-24 ENCOUNTER — Other Ambulatory Visit (HOSPITAL_COMMUNITY): Payer: Self-pay

## 2024-09-24 ENCOUNTER — Other Ambulatory Visit: Payer: Self-pay | Admitting: Internal Medicine

## 2024-09-24 DIAGNOSIS — I10 Essential (primary) hypertension: Secondary | ICD-10-CM

## 2024-09-24 MED ORDER — NEBIVOLOL HCL 5 MG PO TABS
5.0000 mg | ORAL_TABLET | Freq: Every day | ORAL | 0 refills | Status: AC
  Filled 2024-09-24: qty 90, 90d supply, fill #0

## 2024-09-24 NOTE — Telephone Encounter (Signed)
 Copied from CRM #[id number]. Topic: Clinical - Medication Question >> Sep 24, 2024  9:42 AM Frederich PARAS wrote: Reason for CRM: nebivolol  (BYSTOLIC ) 5 MG tablet Pt has no refills for this medication, but has an apt in feb to see pcp, pt would like to know if a prescription can be given to hold her off until her pt on 2/2   Pt callback # [phone]

## 2024-09-28 ENCOUNTER — Encounter: Payer: Self-pay | Admitting: Internal Medicine

## 2024-10-03 ENCOUNTER — Telehealth: Payer: Self-pay

## 2024-10-03 ENCOUNTER — Other Ambulatory Visit (HOSPITAL_COMMUNITY): Payer: Self-pay

## 2024-10-03 NOTE — Telephone Encounter (Signed)
 Copied from CRM [id number]. Topic: Clinical - Medication Refill >> Oct 03, 2024 10:38 AM Treva T wrote: Medication: progesterone  (PROMETRIUM ) 200 MG capsule  Pt reports has been getting refills from Dr. Joshua for medication  Has the patient contacted their pharmacy? Yes, advised to contact office   This is the patient's preferred pharmacy:  [facility] [address] [place] [address] Phone: [phone] Fax: [phone]    Is this the correct pharmacy for this prescription? Yes If no, delete pharmacy and type the correct one.   Has the prescription been filled recently? Yes  Is the patient out of the medication? Yes  Has the patient been seen for an appointment in the last year OR does the patient have an upcoming appointment? Yes  Can we respond through MyChart? Yes  Agent: Please be advised that Rx refills may take up to 3 business days. We ask that you follow-up with your pharmacy.

## 2024-10-04 ENCOUNTER — Other Ambulatory Visit (HOSPITAL_COMMUNITY): Payer: Self-pay

## 2024-10-04 ENCOUNTER — Other Ambulatory Visit: Payer: Self-pay

## 2024-10-04 MED ORDER — PROGESTERONE 200 MG PO CAPS
200.0000 mg | ORAL_CAPSULE | Freq: Every day | ORAL | 0 refills | Status: AC
  Filled 2024-10-04: qty 90, 90d supply, fill #0

## 2024-10-04 NOTE — Telephone Encounter (Signed)
 Medication refill request sent to [facility]

## 2024-10-08 ENCOUNTER — Other Ambulatory Visit (HOSPITAL_COMMUNITY): Payer: Self-pay

## 2024-10-14 ENCOUNTER — Telehealth: Payer: Self-pay

## 2024-10-14 ENCOUNTER — Other Ambulatory Visit (HOSPITAL_COMMUNITY): Payer: Self-pay

## 2024-10-14 ENCOUNTER — Ambulatory Visit: Admitting: Internal Medicine

## 2024-10-14 MED ORDER — PROGESTERONE 200 MG PO CAPS
200.0000 mg | ORAL_CAPSULE | Freq: Every day | ORAL | 1 refills | Status: AC
  Filled 2024-10-14: qty 90, 90d supply, fill #0
